# Patient Record
Sex: Male | Born: 1965 | Race: Black or African American | Hispanic: No | Marital: Single | Smoking: Never smoker
Health system: Southern US, Community
[De-identification: ages and names within clinical notes are randomized; demographics above are authoritative.]

## PROBLEM LIST (undated history)

## (undated) DIAGNOSIS — K7030 Alcoholic cirrhosis of liver without ascites: Secondary | ICD-10-CM

## (undated) DIAGNOSIS — K7682 Hepatic encephalopathy: Secondary | ICD-10-CM

## (undated) DIAGNOSIS — Z01818 Encounter for other preprocedural examination: Secondary | ICD-10-CM

## (undated) DIAGNOSIS — R7989 Other specified abnormal findings of blood chemistry: Secondary | ICD-10-CM

## (undated) DIAGNOSIS — I10 Essential (primary) hypertension: Secondary | ICD-10-CM

## (undated) DIAGNOSIS — K279 Peptic ulcer, site unspecified, unspecified as acute or chronic, without hemorrhage or perforation: Secondary | ICD-10-CM

## (undated) DIAGNOSIS — R778 Other specified abnormalities of plasma proteins: Secondary | ICD-10-CM

## (undated) DIAGNOSIS — I8511 Secondary esophageal varices with bleeding: Secondary | ICD-10-CM

## (undated) DIAGNOSIS — K922 Gastrointestinal hemorrhage, unspecified: Secondary | ICD-10-CM

## (undated) HISTORY — DX: Encounter for other preprocedural examination: Z01.818

## (undated) HISTORY — DX: Hepatic encephalopathy: K76.82

## (undated) HISTORY — DX: Other specified abnormal findings of blood chemistry: R79.89

## (undated) HISTORY — DX: Secondary esophageal varices with bleeding: I85.11

## (undated) HISTORY — DX: Other specified abnormalities of plasma proteins: R77.8

---

## 2011-08-19 ENCOUNTER — Emergency Department: Payer: Self-pay | Admitting: Emergency Medicine

## 2016-11-09 ENCOUNTER — Emergency Department: Payer: Self-pay

## 2016-11-09 ENCOUNTER — Emergency Department
Admission: EM | Admit: 2016-11-09 | Discharge: 2016-11-09 | Disposition: A | Discharge status: Home / Self Care | Payer: Self-pay | Attending: Emergency Medicine | Admitting: Emergency Medicine

## 2016-11-09 DIAGNOSIS — M7989 Other specified soft tissue disorders: Secondary | ICD-10-CM | POA: Insufficient documentation

## 2016-11-09 DIAGNOSIS — R55 Syncope and collapse: Secondary | ICD-10-CM | POA: Insufficient documentation

## 2016-11-09 DIAGNOSIS — R609 Edema, unspecified: Secondary | ICD-10-CM

## 2016-11-09 LAB — URINALYSIS, COMPLETE (UACMP) WITH MICROSCOPIC
Bacteria, UA: NONE SEEN
Bilirubin Urine: NEGATIVE
GLUCOSE, UA: NEGATIVE mg/dL
Hgb urine dipstick: NEGATIVE
Ketones, ur: NEGATIVE mg/dL
Leukocytes, UA: NEGATIVE
Nitrite: NEGATIVE
PH: 6 (ref 5.0–8.0)
Protein, ur: NEGATIVE mg/dL
SPECIFIC GRAVITY, URINE: 1.018 (ref 1.005–1.030)

## 2016-11-09 LAB — CBC WITH DIFFERENTIAL/PLATELET
BASOS PCT: 1 %
Basophils Absolute: 0.1 10*3/uL (ref 0–0.1)
EOS ABS: 0.4 10*3/uL (ref 0–0.7)
EOS PCT: 5 %
HCT: 38.8 % — ABNORMAL LOW (ref 40.0–52.0)
Hemoglobin: 13.2 g/dL (ref 13.0–18.0)
Lymphocytes Relative: 44 %
Lymphs Abs: 3.5 10*3/uL (ref 1.0–3.6)
MCH: 29.1 pg (ref 26.0–34.0)
MCHC: 33.9 g/dL (ref 32.0–36.0)
MCV: 85.8 fL (ref 80.0–100.0)
MONOS PCT: 11 %
Monocytes Absolute: 0.9 10*3/uL (ref 0.2–1.0)
Neutro Abs: 3 10*3/uL (ref 1.4–6.5)
Neutrophils Relative %: 39 %
PLATELETS: 144 10*3/uL — AB (ref 150–440)
RBC: 4.52 MIL/uL (ref 4.40–5.90)
RDW: 16.7 % — ABNORMAL HIGH (ref 11.5–14.5)
WBC: 7.8 10*3/uL (ref 3.8–10.6)

## 2016-11-09 LAB — COMPREHENSIVE METABOLIC PANEL
ALK PHOS: 152 U/L — AB (ref 38–126)
ALT: 48 U/L (ref 17–63)
AST: 97 U/L — ABNORMAL HIGH (ref 15–41)
Albumin: 3.3 g/dL — ABNORMAL LOW (ref 3.5–5.0)
Anion gap: 7 (ref 5–15)
BILIRUBIN TOTAL: 1.1 mg/dL (ref 0.3–1.2)
BUN: 8 mg/dL (ref 6–20)
CALCIUM: 8.6 mg/dL — AB (ref 8.9–10.3)
CO2: 25 mmol/L (ref 22–32)
CREATININE: 0.7 mg/dL (ref 0.61–1.24)
Chloride: 106 mmol/L (ref 101–111)
GFR calc non Af Amer: >60 mL/min (ref >60)
GLUCOSE: 162 mg/dL — AB (ref 65–99)
Potassium: 3.7 mmol/L (ref 3.5–5.1)
SODIUM: 138 mmol/L (ref 135–145)
TOTAL PROTEIN: 8.4 g/dL — AB (ref 6.5–8.1)

## 2016-11-09 LAB — URINE DRUG SCREEN, QUALITATIVE (ARMC ONLY)
AMPHETAMINES, UR SCREEN: NOT DETECTED
Barbiturates, Ur Screen: NOT DETECTED
Benzodiazepine, Ur Scrn: NOT DETECTED
COCAINE METABOLITE, UR ~~LOC~~: NOT DETECTED
Cannabinoid 50 Ng, Ur ~~LOC~~: NOT DETECTED
MDMA (ECSTASY) UR SCREEN: NOT DETECTED
Methadone Scn, Ur: NOT DETECTED
OPIATE, UR SCREEN: NOT DETECTED
PHENCYCLIDINE (PCP) UR S: NOT DETECTED
Tricyclic, Ur Screen: NOT DETECTED

## 2016-11-09 LAB — TROPONIN I: Troponin I: <0.03 ng/mL (ref <0.03)

## 2016-11-09 NOTE — ED Triage Notes (Signed)
Pt from home via EMS, pt initially reports chest pain and R arm pain. Per ems pt had syncopal episode and when he awoke he did not remember anything, was acting altered per family. Pt back to baseline now.

## 2016-11-09 NOTE — ED Provider Notes (Signed)
[facility] Emergency Department Provider Note   ____________________________________________   First MD Initiated Contact with Patient 11/09/16 1810     (approximate)  I have reviewed the triage vital signs and the nursing notes.   HISTORY  Chief Complaint Altered Mental Status and Chest Pain   HPI Yuto Dirienzo is a 50 y.o. male patient reports he developed a some blisters on his right hand sometime ago and pain shooting up from there to his shoulder. He was seen at urgent care and apparently was given a sling. Pain got worse today and he was going to go back to the urgent care reached out to the door of his girlfriend's car and the pain got for a bad and he fell down to the ground next to the car. He does not think he actually passed out. EMS arrived because he was still on the ground he complained of the pain shooting up into his shoulder and somewhat into the chest as well. He did not want to go to the hospital in the ambulance she was given a signed the release for the EMS crew and then rolled over and passed out. EMS gave him a sternal rub in the back of the ambulance and he woke up but was not acting normally. He signed a release he did not sign on the line but well below it and very scrawled signature. Then he woke up more started acting normally. He was acting abnormally he said he couldn't move his legs. After he woke up he did not remember any of that. Not have any seizure activity. He is now acting normally and told me about the episode or reaching out to the car door but could not remember the rest of it. Some pain in his hand radiating up to the shoulder at the present time.   History reviewed. No pertinent past medical history. No past medical history available patient has not been to the doctor for many years There are no active problems to display for this patient.   History reviewed. No pertinent surgical history.  Prior to Admission medications    Not on File    Allergies Patient has no known allergies.  No family history on file.  Social History Social History  Substance Use Topics  . Smoking status: Never Smoker  . Smokeless tobacco: Never Used  . Alcohol use No    Review of Systems Constitutional: No fever/chills Eyes: No visual changes. ENT: No sore throat. Cardiovascular:See history of present illness Respiratory: Denies shortness of breath at present. Gastrointestinal: No abdominal pain.  No nausea, no vomiting.  No diarrhea.  No constipation. Genitourinary: Negative for dysuria. Musculoskeletal: Negative for back pain. Skin: Negative for rash. Neurological: Negative for headaches, focal weakness or numbness.  10-point ROS otherwise negative.  ____________________________________________   PHYSICAL EXAM:  VITAL SIGNS: ED Triage Vitals  Enc Vitals Group     BP 11/09/16 1801 (!) 134/105     Pulse Rate 11/09/16 1802 90     Resp 11/09/16 1811 18     Temp 11/09/16 1812 98.1 F (36.7 C)     Temp src --      SpO2 11/09/16 1802 95 %     Weight 11/09/16 1803 240 lb (108.9 kg)     Height 11/09/16 1803 5\' 8"  (1.727 m)     Head Circumference --      Peak Flow --      Pain Score 11/09/16 1803 8  Pain Loc --      Pain Edu? --      Excl. in [place]? --     Constitutional: Alert and oriented. Well appearing and in no acute distress. Eyes: Conjunctivae are normal. PERRL. EOMI. Head: Atraumatic. Nose: No congestion/rhinnorhea. Mouth/Throat: Mucous membranes are moist.  Oropharynx non-erythematous. Neck: No stridor.  Cardiovascular: Normal rate, regular rhythm. Grossly normal heart sounds.  Good peripheral circulation. Respiratory: Normal respiratory effort.  No retractions. Lungs CTAB. Gastrointestinal: Soft and nontender. No distention. No abdominal bruits. No CVA tenderness. Musculoskeletal: No lower extremity tenderness nor edema.  No joint effusions. Neurologic:  Normal speech and language. No gross  focal neurologic deficits are appreciated Nerves II through XII are intact motor is 5 over 5 throughout sensation appears to be normal.  Skin:  Skin is warm, dry and intact. No rash noted except for what appear to be 3 healing blisters on his right hand on the palm.Marland Kitchen Psychiatric: Mood and affect are normal. Speech and behavior are normal.  ____________________________________________   LABS (all labs ordered are listed, but only abnormal results are displayed)  Labs Reviewed  COMPREHENSIVE METABOLIC PANEL - Abnormal; Notable for the following:       Result Value   Glucose, Bld 162 (*)    Calcium 8.6 (*)    Total Protein 8.4 (*)    Albumin 3.3 (*)    AST 97 (*)    Alkaline Phosphatase 152 (*)    All other components within normal limits  CBC WITH DIFFERENTIAL/PLATELET - Abnormal; Notable for the following:    HCT 38.8 (*)    RDW 16.7 (*)    Platelets 144 (*)    All other components within normal limits  TROPONIN I  URINALYSIS, COMPLETE (UACMP) WITH MICROSCOPIC  URINE DRUG SCREEN, QUALITATIVE ([facility] ONLY)  TROPONIN I   ____________________________________________  EKG  EKG read and interpreted by me shows normal sinus rhythm rate of 94 irregular baseline nonspecific ST-T wave changes ____________________________________________  RADIOLOGY  Study Result   CLINICAL DATA:  Chest pain today radiating to RIGHT arm. Syncopal episode.  EXAM: PORTABLE CHEST 1 VIEW  COMPARISON:  None.  FINDINGS: Cardiomediastinal silhouette is normal. No pleural effusions or focal consolidations. Trachea projects midline and there is no pneumothorax. Soft tissue planes and included osseous structures are non-suspicious. Moderate RIGHT acromioclavicular osteoarthrosis.  IMPRESSION: No acute cardiopulmonary process.   Electronically Signed   By: Elon Alas M.D.   On: 11/09/2016 19:12    Study Result   CLINICAL DATA:  Syncopal episode today. Altered mental  status. Chronic pain.  EXAM: CT HEAD WITHOUT CONTRAST  TECHNIQUE: Contiguous axial images were obtained from the base of the skull through the vertex without intravenous contrast.  COMPARISON:  None.  FINDINGS: Brain: No evidence of acute infarction, hemorrhage, hydrocephalus, extra-axial collection or mass lesion/mass effect.  Vascular: No hyperdense vessel or unexpected calcification.  Skull: Normal. Negative for fracture or focal lesion.  Sinuses/Orbits: No acute finding.  Other: None.  IMPRESSION: Negative noncontrast head CT.   Electronically Signed   By: Earle Gell M.D.   On: 11/09/2016 19:16     ____________________________________________   PROCEDURES  Procedure(s) performed:  Procedures  Critical Care performed: ____________________________________________   INITIAL IMPRESSION / ASSESSMENT AND PLAN / ED COURSE  Pertinent labs & imaging results that were available during my care of the patient were reviewed by me and considered in my medical decision making (see chart for details).    Clinical Course  Awaiting the second troponin. We'll sign the patient out to Dr. Dineen Kid  ____________________________________________   FINAL CLINICAL IMPRESSION(S) / ED DIAGNOSES  Final diagnoses:  Syncope and collapse      NEW MEDICATIONS STARTED DURING THIS VISIT:  New Prescriptions   No medications on file     Note:  This document was prepared using Dragon voice recognition software and may include unintentional dictation errors.    Nena Polio, MD 11/09/16 2016

## 2016-11-09 NOTE — ED Provider Notes (Signed)
Patient present in the emergency department with right arm pain radiating into his chest and syncopal episode.  So far his workup has been very reassuring. The signout from Dr. Cinda Quest is to check a second troponin and also follow up with the Doppler sign of his right upper extremity. If both reassuring the patient's plan is to be discharged home.  Physical Exam  BP (!) 145/96   Pulse 80   Temp 98.1 F (36.7 C)   Resp 15   Ht 5\' 8"  (1.727 m)   Wt 240 lb (108.9 kg)   SpO2 97%   BMI 36.49 kg/m   ----------------------------------------- 10:55 PM on 11/09/2016 -----------------------------------------  No evidence of any deep venous thrombosis to the right upper extremity.  Physical Exam Patient resting comfortably at this time without any distress. ED Course  Procedures  MDM Patient asking to be discharged home. Reassuring workup. Will be discharged. Counseled patient as to his ultrasound as well as second troponin which were both negative. He will follow-up as an outpatient.       Orbie Pyo, MD 11/09/16 [phone]

## 2016-11-09 NOTE — ED Notes (Signed)
Patient transported to Ultrasound 

## 2016-11-09 NOTE — Discharge Instructions (Signed)
Please follow-up with [facility] or any other doctor in the area. You can also see the [facility], the [facility] clinic, [facility], or [place]. Please return for any further problems. I am not sure what happened this evening but all the tests so far have been normal.

## 2016-11-09 NOTE — ED Notes (Signed)
Reviewed d/c instructions, follow-up care with patient. Pt verbalized understanding.  

## 2018-11-02 ENCOUNTER — Emergency Department: Payer: Self-pay

## 2018-11-02 ENCOUNTER — Emergency Department
Admission: EM | Admit: 2018-11-02 | Discharge: 2018-11-02 | Disposition: A | Discharge status: Home / Self Care | Payer: Self-pay | Attending: Emergency Medicine | Admitting: Emergency Medicine

## 2018-11-02 ENCOUNTER — Encounter: Payer: Self-pay | Admitting: Emergency Medicine

## 2018-11-02 DIAGNOSIS — Y9289 Other specified places as the place of occurrence of the external cause: Secondary | ICD-10-CM | POA: Insufficient documentation

## 2018-11-02 DIAGNOSIS — Y990 Civilian activity done for income or pay: Secondary | ICD-10-CM | POA: Insufficient documentation

## 2018-11-02 DIAGNOSIS — M629 Disorder of muscle, unspecified: Secondary | ICD-10-CM

## 2018-11-02 DIAGNOSIS — M62461 Contracture of muscle, right lower leg: Secondary | ICD-10-CM | POA: Insufficient documentation

## 2018-11-02 DIAGNOSIS — Y9389 Activity, other specified: Secondary | ICD-10-CM | POA: Insufficient documentation

## 2018-11-02 DIAGNOSIS — X509XXA Other and unspecified overexertion or strenuous movements or postures, initial encounter: Secondary | ICD-10-CM | POA: Insufficient documentation

## 2018-11-02 DIAGNOSIS — M62462 Contracture of muscle, left lower leg: Secondary | ICD-10-CM | POA: Insufficient documentation

## 2018-11-02 DIAGNOSIS — T148XXA Other injury of unspecified body region, initial encounter: Secondary | ICD-10-CM | POA: Insufficient documentation

## 2018-11-02 MED ORDER — MELOXICAM 15 MG PO TABS: 15.0000 mg | ORAL_TABLET | Freq: Every day | ORAL | 0 refills | Status: AC

## 2018-11-02 MED ORDER — CYCLOBENZAPRINE HCL 5 MG PO TABS: 5.0000 mg | ORAL_TABLET | Freq: Three times a day (TID) | ORAL | 0 refills | Status: AC | PRN

## 2018-11-02 NOTE — Discharge Instructions (Addendum)
Your exam and x-rays are negative for any acute spinal injury. You appear to have muscle stiffness and strain. Take the prescription meds as directed. Follow-up with one of the local community clinics as needed.

## 2018-11-02 NOTE — ED Provider Notes (Signed)
[facility] Emergency Department Provider Note ____________________________________________  Time seen: 2157  I have reviewed the triage vital signs and the nursing notes.  HISTORY  Chief Complaint  Back Pain  HPI Terrence Martin is a 52 y.o. male who presents himself to the ED for evaluation of pain to bilateral buttocks and legs.  Describes symptoms of been persistent over the last 2 weeks.  He reports no current, chronic or ongoing low back pain.  He has had intermittent strains to the lower back over the years, but denies any chronic or persistent symptoms.  He is describing tightness to the buttocks, hamstrings, and calves to the posterior legs bilaterally.  He denies any bladder or bowel incontinence, distal paresthesias, footdrop, or saddle anesthesia.  He has been taking Tylenol over the last few days with limited benefit.  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  He denies any work-related injury, accident, trauma, or fall.  Patient denies any chest pain, shortness of breath, or recent illness.  History reviewed. No pertinent past medical history.  There are no active problems to display for this patient.  History reviewed. No pertinent surgical history.  Prior to Admission medications   Not on File    Allergies Patient has no known allergies.  No family history on file.  Social History Social History   Tobacco Use  . Smoking status: Never Smoker  . Smokeless tobacco: Never Used  Substance Use Topics  . Alcohol use: Yes  . Drug use: Never    Review of Systems  Constitutional: Negative for fever. Cardiovascular: Negative for chest pain. Respiratory: Negative for shortness of breath. Gastrointestinal: Negative for abdominal pain, vomiting and diarrhea. Genitourinary: Negative for dysuria. Musculoskeletal: Negative for back pain.  Bilateral lower  extremity pain along the posterior chain as above. Skin: Negative for rash. Neurological: Negative for headaches, focal weakness or numbness. ____________________________________________  PHYSICAL EXAM:  VITAL SIGNS: ED Triage Vitals [11/02/18 2053]  Enc Vitals Group     BP (!) 155/98     Pulse Rate 86     Resp 18     Temp 97.8 F (36.6 C)     Temp Source Oral     SpO2 98 %     Weight 224 lb (101.6 kg)     Height 5\' 8"  (1.727 m)     Head Circumference      Peak Flow      Pain Score 10     Pain Loc      Pain Edu?      Excl. in [place]?     Constitutional: Alert and oriented. Well appearing and in no distress. Head: Normocephalic and atraumatic. Eyes: Conjunctivae are normal. Normal extraocular movements Neck: Supple. Normal ROM Cardiovascular: Normal rate, regular rhythm. Normal distal pulses. Respiratory: Normal respiratory effort. No wheezes/rales/rhonchi. Gastrointestinal: Soft and nontender. No distention. Musculoskeletal: Normal spinal alignment without midline tenderness, spasm, deformity, or step-off.  Patient transitions from sit to stand without assistance.  He is without tenderness along the lumbar sacral spine.  He is able to demonstrate normal lumbar flexion and extension range on exam without difficulty.  He does report subjective muscle tightness along the gluteal and hamstring musculature.  Normal hip flexion noted bilaterally.  Nontender with normal range of motion in all extremities.  Neurologic: Cranial nerves II through XII grossly intact.  Normal LE DTRs bilaterally.  Normal  toe dorsiflexion foot eversion.  Negative seated straight leg raise bilaterally.  Normal toe and heel walk on exam.  Normal gait without ataxia. Normal speech and language. No gross focal neurologic deficits are appreciated. Skin:  Skin is warm, dry and intact. No rash noted. ___________________________________________   RADIOLOGY  Lumbar Spine Complete  IMPRESSION: 1. No acute fracture  or subluxation of the lumbar spine. 2. Grade 1 L4-L5 anterolisthesis. 3. Mild age indeterminate compression deformities of T11 and T12. ____________________________________________  PROCEDURES  Procedures ____________________________________________  INITIAL IMPRESSION / ASSESSMENT AND PLAN / ED COURSE  Patient with ED evaluation of bilateral buttocks and hamstring muscle pain.  He is without any acute back pain at this time.  Patient's clinical picture is reassuring as it shows no acute neuromuscular deficit.  Symptoms likely represent bilateral posterior chain muscle soreness.  Patient will be discharged with a prescription for cyclobenzaprine as well as meloxicam to take as directed.  He is also referred to local community clinic for routine medical care.  He is advised to consider stretching and warming up prior to his physical work activities.   ____________________________________________  FINAL CLINICAL IMPRESSION(S) / ED DIAGNOSES  Final diagnoses:  Muscle strain  Hamstring tightness of both lower extremities      Sinclair Alligood, Dannielle Karvonen, PA-C 11/02/18 2338    Arta Silence, MD 11/03/18 0002

## 2018-11-02 NOTE — ED Triage Notes (Signed)
Patient with complaint of bilateral lower back pain radiating down bilateral legs times two weeks. Patient states that the pain is getting worse.

## 2020-06-08 DIAGNOSIS — K922 Gastrointestinal hemorrhage, unspecified: Secondary | ICD-10-CM | POA: Insufficient documentation

## 2020-08-19 ENCOUNTER — Other Ambulatory Visit: Payer: Self-pay

## 2020-08-19 ENCOUNTER — Emergency Department
Admission: EM | Admit: 2020-08-19 | Discharge: 2020-08-19 | Disposition: A | Discharge status: Home / Self Care | Payer: Self-pay | Attending: Emergency Medicine | Admitting: Emergency Medicine

## 2020-08-19 ENCOUNTER — Telehealth: Payer: Self-pay | Admitting: Oncology

## 2020-08-19 DIAGNOSIS — W19XXXA Unspecified fall, initial encounter: Secondary | ICD-10-CM | POA: Insufficient documentation

## 2020-08-19 DIAGNOSIS — Z20822 Contact with and (suspected) exposure to covid-19: Secondary | ICD-10-CM | POA: Insufficient documentation

## 2020-08-19 DIAGNOSIS — D649 Anemia, unspecified: Secondary | ICD-10-CM | POA: Insufficient documentation

## 2020-08-19 DIAGNOSIS — R42 Dizziness and giddiness: Secondary | ICD-10-CM

## 2020-08-19 DIAGNOSIS — R55 Syncope and collapse: Secondary | ICD-10-CM

## 2020-08-19 LAB — IRON AND TIBC
Iron: 13 ug/dL — ABNORMAL LOW (ref 45–182)
Saturation Ratios: 3 % — ABNORMAL LOW (ref 17.9–39.5)
TIBC: 496 ug/dL — ABNORMAL HIGH (ref 250–450)
UIBC: 483 ug/dL

## 2020-08-19 LAB — BRAIN NATRIURETIC PEPTIDE: B Natriuretic Peptide: 83 pg/mL (ref 0.0–100.0)

## 2020-08-19 LAB — BASIC METABOLIC PANEL
Anion gap: 8 (ref 5–15)
BUN: 6 mg/dL (ref 6–20)
CO2: 23 mmol/L (ref 22–32)
Calcium: 8.1 mg/dL — ABNORMAL LOW (ref 8.9–10.3)
Chloride: 106 mmol/L (ref 98–111)
Creatinine, Ser: 0.72 mg/dL (ref 0.61–1.24)
GFR calc Af Amer: >60 mL/min (ref >60)
GFR calc non Af Amer: >60 mL/min (ref >60)
Glucose, Bld: 108 mg/dL — ABNORMAL HIGH (ref 70–99)
Potassium: 3.4 mmol/L — ABNORMAL LOW (ref 3.5–5.1)
Sodium: 137 mmol/L (ref 135–145)

## 2020-08-19 LAB — CBC WITH DIFFERENTIAL/PLATELET
Abs Immature Granulocytes: 0.01 10*3/uL (ref 0.00–0.07)
Basophils Absolute: 0.1 10*3/uL (ref 0.0–0.1)
Basophils Relative: 1 %
Eosinophils Absolute: 0.3 10*3/uL (ref 0.0–0.5)
Eosinophils Relative: 6 %
HCT: 24.9 % — ABNORMAL LOW (ref 39.0–52.0)
Hemoglobin: 7.4 g/dL — ABNORMAL LOW (ref 13.0–17.0)
Immature Granulocytes: 0 %
Lymphocytes Relative: 39 %
Lymphs Abs: 1.7 10*3/uL (ref 0.7–4.0)
MCH: 21.1 pg — ABNORMAL LOW (ref 26.0–34.0)
MCHC: 29.7 g/dL — ABNORMAL LOW (ref 30.0–36.0)
MCV: 71.1 fL — ABNORMAL LOW (ref 80.0–100.0)
Monocytes Absolute: 0.5 10*3/uL (ref 0.1–1.0)
Monocytes Relative: 11 %
Neutro Abs: 1.9 10*3/uL (ref 1.7–7.7)
Neutrophils Relative %: 43 %
Platelets: 109 10*3/uL — ABNORMAL LOW (ref 150–400)
RBC: 3.5 MIL/uL — ABNORMAL LOW (ref 4.22–5.81)
RDW: 20.4 % — ABNORMAL HIGH (ref 11.5–15.5)
WBC: 4.4 10*3/uL (ref 4.0–10.5)
nRBC: 0 % (ref 0.0–0.2)

## 2020-08-19 LAB — CBC
HCT: 23 % — ABNORMAL LOW (ref 39.0–52.0)
Hemoglobin: 7.3 g/dL — ABNORMAL LOW (ref 13.0–17.0)
MCH: 21.6 pg — ABNORMAL LOW (ref 26.0–34.0)
MCHC: 31.7 g/dL (ref 30.0–36.0)
MCV: 68 fL — ABNORMAL LOW (ref 80.0–100.0)
Platelets: 101 10*3/uL — ABNORMAL LOW (ref 150–400)
RBC: 3.38 MIL/uL — ABNORMAL LOW (ref 4.22–5.81)
RDW: 20.9 % — ABNORMAL HIGH (ref 11.5–15.5)
WBC: 5.4 10*3/uL (ref 4.0–10.5)
nRBC: 0 % (ref 0.0–0.2)

## 2020-08-19 LAB — PREPARE RBC (CROSSMATCH)

## 2020-08-19 LAB — TROPONIN I (HIGH SENSITIVITY): Troponin I (High Sensitivity): 11 ng/L (ref <18)

## 2020-08-19 LAB — SARS CORONAVIRUS 2 BY RT PCR (HOSPITAL ORDER, PERFORMED IN ~~LOC~~ HOSPITAL LAB): SARS Coronavirus 2: NEGATIVE

## 2020-08-19 MED ORDER — FERROUS SULFATE 325 (65 FE) MG PO TABS: 325.0000 mg | ORAL_TABLET | Freq: Two times a day (BID) | ORAL | 3 refills | Status: DC

## 2020-08-19 MED ORDER — SODIUM CHLORIDE 0.9 % IV SOLN
10.0000 mL/h | Freq: Once | INTRAVENOUS | Status: AC
  Administered 2020-08-19: 10 mL/h via INTRAVENOUS

## 2020-08-19 NOTE — ED Notes (Signed)
Additional lavender tube sent to lab for ABO RH.

## 2020-08-19 NOTE — ED Provider Notes (Signed)
[facility] Emergency Department Provider Note  Time seen: 5:33 PM  I have reviewed the triage vital signs and the nursing notes.   HISTORY  Chief Complaint Dizziness and Fall   HPI Terrence Martin is a 54 y.o. male with a past medical history of an upper GI bleed 3 months ago presents to the emergency department for weakness and a fall.   According to the patient in July he was admitted to the hospital while visiting [facility] for vomiting blood.  Patient states he required blood transfusions at that time.  However since going home he has had no further issues.  No nausea no vomiting no black or bloody stool.  Patient does admit to daily aspirin and alcohol use, states he stopped drinking liquor after that admission but still drinks beer.  Patient states he averages about 2 aspirin per day for various aches and pains.  Patient states he has noticed some leg swelling since his discharge from the hospital months ago.  Denies any chest pain or shortness of breath.  Has not received Covid vaccinations but denies any recent fever cough or congestion.  No past medical history on file.  There are no problems to display for this patient.   No past surgical history on file.  Prior to Admission medications   Not on File    No Known Allergies  No family history on file.  Social History Social History   Tobacco Use  . Smoking status: Never Smoker  . Smokeless tobacco: Never Used  Substance Use Topics  . Alcohol use: Yes  . Drug use: Never    Review of Systems Constitutional: Negative for fever. Cardiovascular: Negative for chest pain. Respiratory: Negative for shortness of breath. Gastrointestinal: Negative for abdominal pain Musculoskeletal: Negative for musculoskeletal complaints Neurological: Negative for headache All other ROS negative  ____________________________________________   PHYSICAL EXAM:  VITAL SIGNS: ED Triage Vitals  Enc Vitals Group      BP 08/19/20 1431 133/73     Pulse Rate 08/19/20 1431 88     Resp 08/19/20 1431 20     Temp 08/19/20 1431 98.1 F (36.7 C)     Temp Source 08/19/20 1431 Oral     SpO2 08/19/20 1431 100 %     Weight 08/19/20 1432 240 lb (108.9 kg)     Height 08/19/20 1432 5\' 8"  (1.727 m)     Head Circumference --      Peak Flow --      Pain Score 08/19/20 1431 0     Pain Loc --      Pain Edu? --      Excl. in [place]? --     Constitutional: Alert and oriented. Well appearing and in no distress. Eyes: Normal exam ENT      Head: Normocephalic and atraumatic.      Mouth/Throat: Mucous membranes are moist. Cardiovascular: Normal rate, regular rhythm. No murmur Respiratory: Normal respiratory effort without tachypnea nor retractions. Breath sounds are clear  Gastrointestinal: Soft and nontender. No distention.   Musculoskeletal: Nontender with normal range of motion in all extremities.  Positive lower extreme edema bilaterally.  Taut legs.  Nontender. Neurologic:  Normal speech and language. No gross focal neurologic deficits  Skin:  Skin is warm, dry and intact.  Psychiatric: Mood and affect are normal.   ____________________________________________   INITIAL IMPRESSION / ASSESSMENT AND PLAN / ED COURSE  Pertinent labs & imaging results that were available during my care of the patient  were reviewed by me and considered in my medical decision making (see chart for details).   Patient presents to the emergency department for generalized weakness while at work today and had a near fall.  Denies hitting his head denies LOC.  Patient states he had been feeling well up until today.  States he feels well currently.  States his boss wanted him to go get checked out.  Patient's hemoglobin found to be very low at 7.3 as well as a low MCV of 68 and a low platelet count of 101.  Patient denies any nausea vomiting, rectal examination shows light brown stool guaiac negative.  Last labs we have on the patient were from 3  years ago showing a normal hemoglobin and MCV at that time.  Now his MCV being 68 would indicate likely iron deficient anemia.  Patient states he checks his stool every bowel movement has not noticed any black stool at any point.  I spoke to hematology they recommend adding on a differential we will repeat a CBC with differential as well as iron and TIBC.  I have added on a troponin as well as a BNP.  If lab work returns largely normal I would anticipate likely discharge home and hematology states they will call the patient follow-up for further work-up and treatment and possible iron infusions.  I discussed this with the patient as well he is agreeable.  We will transfuse 1 unit of blood in the emergency department given likely symptomatic anemia.  However as there is no ongoing bleed I believe the patient would be safe for discharge home with hematology follow-up.  Patient has finished his blood products.  Patient feels well.  We will discharge from the emergency department on oral iron supplements.  Patient will follow up with hematology, they state they will call him tomorrow to set up an appointment.  Patient agreeable to plan of care.  Terrence Martin was evaluated in Emergency Department on 08/19/2020 for the symptoms described in the history of present illness. He was evaluated in the context of the global COVID-19 pandemic, which necessitated consideration that the patient might be at risk for infection with the SARS-CoV-2 virus that causes COVID-19. Institutional protocols and algorithms that pertain to the evaluation of patients at risk for COVID-19 are in a state of rapid change based on information released by regulatory bodies including the CDC and federal and state organizations. These policies and algorithms were followed during the patient's care in the ED.  ____________________________________________   FINAL CLINICAL IMPRESSION(S) / ED DIAGNOSES  Symptomatic anemia Weakness   Harvest Dark, MD 08/19/20 2248

## 2020-08-19 NOTE — ED Triage Notes (Signed)
Pt here with a fall and dizziness. Pt was hospitalized in [place] with a GI bleed in July. Pt went to work today and was dizzy and fell but denies hitting his head or any injury. Pt NAD in triage.

## 2020-08-19 NOTE — Telephone Encounter (Signed)
I was called by ER Dr.Paduchowski regarding this patient, acute drop of hemoglobin to 7.3, MCV 68.  Baseline hemoglobin in 2017 was 13.2.  Patient reports history of upper GI bleeding in [place] in July this year.  Chronic alcohol use.  Per  Dr.Paduchowski,  Negative stool occult, vitals are stable.  Patient will get 1 unit of PRBC in the emergency room and Dr.Paduchowski plans to discharge patient afterwards and advise patient to follow-up with hematology.  I recommend baseline iron, TIBC, ferritin and a differential being checked prior to the blood transfusion.  I will send message to scheduling staff to schedule this patient to follow-up with me in the next few days for reevaluation.

## 2020-08-20 ENCOUNTER — Other Ambulatory Visit: Payer: Self-pay

## 2020-08-20 DIAGNOSIS — D649 Anemia, unspecified: Secondary | ICD-10-CM

## 2020-08-20 LAB — BPAM RBC
Blood Product Expiration Date: 202110252359
ISSUE DATE / TIME: 202109222021
Unit Type and Rh: 5100

## 2020-08-20 LAB — TYPE AND SCREEN
ABO/RH(D): O POS
Antibody Screen: NEGATIVE
Unit division: 0

## 2020-08-20 LAB — ABO/RH
ABO/RH(D): O POS
ABO/RH(D): O POS

## 2020-08-24 ENCOUNTER — Inpatient Hospital Stay: Payer: Self-pay

## 2020-08-24 ENCOUNTER — Other Ambulatory Visit: Payer: Self-pay

## 2020-08-24 ENCOUNTER — Encounter: Payer: Self-pay | Admitting: Oncology

## 2020-08-24 ENCOUNTER — Inpatient Hospital Stay: Payer: Self-pay | Attending: Oncology | Admitting: Oncology

## 2020-08-24 VITALS — BP 167/97 | HR 88 | Temp 98.9°F | Resp 18 | Ht 68.0 in | Wt 234.3 lb

## 2020-08-24 DIAGNOSIS — Z7289 Other problems related to lifestyle: Secondary | ICD-10-CM | POA: Insufficient documentation

## 2020-08-24 DIAGNOSIS — R7401 Elevation of levels of liver transaminase levels: Secondary | ICD-10-CM | POA: Insufficient documentation

## 2020-08-24 DIAGNOSIS — E538 Deficiency of other specified B group vitamins: Secondary | ICD-10-CM | POA: Insufficient documentation

## 2020-08-24 DIAGNOSIS — Z801 Family history of malignant neoplasm of trachea, bronchus and lung: Secondary | ICD-10-CM | POA: Insufficient documentation

## 2020-08-24 DIAGNOSIS — D509 Iron deficiency anemia, unspecified: Secondary | ICD-10-CM | POA: Insufficient documentation

## 2020-08-24 DIAGNOSIS — D649 Anemia, unspecified: Secondary | ICD-10-CM

## 2020-08-24 DIAGNOSIS — Z789 Other specified health status: Secondary | ICD-10-CM | POA: Insufficient documentation

## 2020-08-24 DIAGNOSIS — D696 Thrombocytopenia, unspecified: Secondary | ICD-10-CM | POA: Insufficient documentation

## 2020-08-24 HISTORY — DX: Iron deficiency anemia, unspecified: D50.9

## 2020-08-24 LAB — HEPATIC FUNCTION PANEL
ALT: 29 U/L (ref 0–44)
AST: 62 U/L — ABNORMAL HIGH (ref 15–41)
Albumin: 2.9 g/dL — ABNORMAL LOW (ref 3.5–5.0)
Alkaline Phosphatase: 106 U/L (ref 38–126)
Bilirubin, Direct: 0.5 mg/dL — ABNORMAL HIGH (ref 0.0–0.2)
Indirect Bilirubin: 1 mg/dL — ABNORMAL HIGH (ref 0.3–0.9)
Total Bilirubin: 1.5 mg/dL — ABNORMAL HIGH (ref 0.3–1.2)
Total Protein: 7.9 g/dL (ref 6.5–8.1)

## 2020-08-24 LAB — CBC WITH DIFFERENTIAL/PLATELET
Abs Immature Granulocytes: 0.01 10*3/uL (ref 0.00–0.07)
Basophils Absolute: 0.1 10*3/uL (ref 0.0–0.1)
Basophils Relative: 1 %
Eosinophils Absolute: 0.3 10*3/uL (ref 0.0–0.5)
Eosinophils Relative: 5 %
HCT: 26.1 % — ABNORMAL LOW (ref 39.0–52.0)
Hemoglobin: 8.4 g/dL — ABNORMAL LOW (ref 13.0–17.0)
Immature Granulocytes: 0 %
Lymphocytes Relative: 37 %
Lymphs Abs: 2 10*3/uL (ref 0.7–4.0)
MCH: 22.3 pg — ABNORMAL LOW (ref 26.0–34.0)
MCHC: 32.2 g/dL (ref 30.0–36.0)
MCV: 69.2 fL — ABNORMAL LOW (ref 80.0–100.0)
Monocytes Absolute: 0.6 10*3/uL (ref 0.1–1.0)
Monocytes Relative: 10 %
Neutro Abs: 2.5 10*3/uL (ref 1.7–7.7)
Neutrophils Relative %: 47 %
Platelets: 115 10*3/uL — ABNORMAL LOW (ref 150–400)
RBC: 3.77 MIL/uL — ABNORMAL LOW (ref 4.22–5.81)
RDW: 22.3 % — ABNORMAL HIGH (ref 11.5–15.5)
WBC: 5.4 10*3/uL (ref 4.0–10.5)
nRBC: 0 % (ref 0.0–0.2)

## 2020-08-24 LAB — SAMPLE TO BLOOD BANK

## 2020-08-24 LAB — FOLATE: Folate: 9.8 ng/mL (ref >5.9)

## 2020-08-24 LAB — VITAMIN B12: Vitamin B-12: 236 pg/mL (ref 180–914)

## 2020-08-24 MED ORDER — VITAMIN B-12 1000 MCG PO TABS: 1000.0000 ug | ORAL_TABLET | Freq: Every day | ORAL | 2 refills | Status: DC

## 2020-08-24 NOTE — Progress Notes (Signed)
Hematology/Oncology Consult note [facility] Telephone:([phone] [phone]   Patient Care Team: Patient, No Pcp Per as PCP - General (General Practice)  REFERRING PROVIDER: Harvest Dark, MD  CHIEF COMPLAINTS/REASON FOR VISIT:  Evaluation of iron deficiency anemia  HISTORY OF PRESENTING ILLNESS:   Terrence Martin is a  54 y.o.  male with PMH listed below was seen in consultation at the request of  Harvest Dark, MD  for evaluation of iron deficiency anemia Patient presented emergency room on 08/19/2020 for evaluation of dizziness and fall. He was found to have hemoglobin of 7.3, MCV 68, also thrombocytopenia with platelet count of 10 1000. Reviewed his records, 06/11/2020 he has CBC done which showed a hemoglobin of 8.2, MCV of 92, thrombocytopenia with platelet count of 1 10,000.  Since then, he reports he had an episode of upper GI bleeding at outside facility. Patient denies any hematuria, hemoptysis, black or bloody stool.  Denies any change of bowel movement. He has a long history of alcohol use.  He used to drink liquor every day.  Currently he drinks 3-4 beers every other day. Patient takes NSAIDs intermittently for pains and aches. He denies any easy bruising, abdominal pain, fever or chills.  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 He received 1 unit of PRBC in the emergency room and was advised to establish care with hematology for further treatment and management.   Review of Systems  Constitutional: Positive for fatigue. Negative for appetite change, chills, fever and unexpected weight change.  HENT:   Negative for hearing loss and voice change.   Eyes: Negative for eye problems and icterus.  Respiratory: Negative for chest tightness, cough and shortness of breath.   Cardiovascular: Negative for chest pain and leg swelling.  Gastrointestinal: Negative for  abdominal distention and abdominal pain.  Endocrine: Negative for hot flashes.  Genitourinary: Negative for difficulty urinating, dysuria and frequency.   Musculoskeletal: Negative for arthralgias.  Skin: Negative for itching and rash.  Neurological: Negative for light-headedness and numbness.  Hematological: Negative for adenopathy. Does not bruise/bleed easily.  Psychiatric/Behavioral: Negative for confusion.    MEDICAL HISTORY:  Past Medical History:  Diagnosis Date  . IDA (iron deficiency anemia) 08/24/2020    SURGICAL HISTORY: History reviewed. No pertinent surgical history.  SOCIAL HISTORY: Social History   Socioeconomic History  . Marital status: Single    Spouse name: Not on file  . Number of children: Not on file  . Years of education: Not on file  . Highest education level: Not on file  Occupational History  . Not on file  Tobacco Use  . Smoking status: Never Smoker  . Smokeless tobacco: Never Used  Vaping Use  . Vaping Use: Never used  Substance and Sexual Activity  . Alcohol use: Not Currently  . Drug use: Never  . Sexual activity: Not on file  Other Topics Concern  . Not on file  Social History Narrative  . Not on file   Social Determinants of Health   Financial Resource Strain:   . Difficulty of Paying Living Expenses: Not on file  Food Insecurity:   . Worried About Charity fundraiser in the Last Year: Not on file  . Ran Out of Food in the Last Year: Not on file  Transportation Needs:   . Lack of Transportation (Medical): Not on file  . Lack of Transportation (Non-Medical): Not  on file  Physical Activity:   . Days of Exercise per Week: Not on file  . Minutes of Exercise per Session: Not on file  Stress:   . Feeling of Stress : Not on file  Social Connections:   . Frequency of Communication with Friends and Family: Not on file  . Frequency of Social Gatherings with Friends and Family: Not on file  . Attends Religious Services: Not on file  .  Active Member of Clubs or Organizations: Not on file  . Attends Archivist Meetings: Not on file  . Marital Status: Not on file  Intimate Partner Violence:   . Fear of Current or Ex-Partner: Not on file  . Emotionally Abused: Not on file  . Physically Abused: Not on file  . Sexually Abused: Not on file    FAMILY HISTORY: Family History  Problem Relation Age of Onset  . Lung cancer Mother   . Cancer Father   . Heart attack Brother     ALLERGIES:  has No Known Allergies.  MEDICATIONS:  Current Outpatient Medications  Medication Sig Dispense Refill  . ferrous sulfate 325 (65 FE) MG tablet Take 1 tablet (325 mg total) by mouth 2 (two) times daily with a meal. 60 tablet 3   No current facility-administered medications for this visit.     PHYSICAL EXAMINATION: ECOG PERFORMANCE STATUS: 0 - Asymptomatic Vitals:   08/24/20 1103  BP: (!) 167/97  Pulse: 88  Resp: 18  Temp: 98.9 F (37.2 C)   Filed Weights   08/24/20 1103  Weight: 234 lb 4.8 oz (106.3 kg)    Physical Exam Constitutional:      General: He is not in acute distress. HENT:     Head: Normocephalic and atraumatic.  Eyes:     General: No scleral icterus. Cardiovascular:     Rate and Rhythm: Normal rate and regular rhythm.     Heart sounds: Normal heart sounds.  Pulmonary:     Effort: Pulmonary effort is normal. No respiratory distress.     Breath sounds: No wheezing.  Abdominal:     General: Bowel sounds are normal. There is no distension.     Palpations: Abdomen is soft.  Musculoskeletal:        General: No deformity. Normal range of motion.     Cervical back: Normal range of motion and neck supple.  Skin:    General: Skin is warm and dry.     Findings: No erythema or rash.  Neurological:     Mental Status: He is alert and oriented to person, place, and time. Mental status is at baseline.     Cranial Nerves: No cranial nerve deficit.     Coordination: Coordination normal.  Psychiatric:         Mood and Affect: Mood normal.     LABORATORY DATA:  I have reviewed the data as listed Lab Results  Component Value Date   WBC 5.4 08/24/2020   HGB 8.4 (L) 08/24/2020   HCT 26.1 (L) 08/24/2020   MCV 69.2 (L) 08/24/2020   PLT 115 (L) 08/24/2020   Recent Labs    08/19/20 1436 08/24/20 1047  NA 137  --   K 3.4*  --   CL 106  --   CO2 23  --   GLUCOSE 108*  --   BUN 6  --   CREATININE 0.72  --   CALCIUM 8.1*  --   GFRNONAA >60  --   GFRAA >60  --  PROT  --  7.9  ALBUMIN  --  2.9*  AST  --  62*  ALT  --  29  ALKPHOS  --  106  BILITOT  --  1.5*  BILIDIR  --  0.5*  IBILI  --  1.0*   Iron/TIBC/Ferritin/ %Sat    Component Value Date/Time   IRON 13 (L) 08/19/2020 1818   TIBC 496 (H) 08/19/2020 1818   IRONPCTSAT 3 (L) 08/19/2020 1818      RADIOGRAPHIC STUDIES: I have personally reviewed the radiological images as listed and agreed with the findings in the report. No results found.    ASSESSMENT & PLAN:  1. Iron deficiency anemia, unspecified iron deficiency anemia type   2. Transaminitis   3. Alcohol use   4. Thrombocytopenia ([place])   5. B12 deficiency    #Iron deficiency anemia, Labs are reviewed and discussed with patient.  Hemoglobin has improved to 8.4 after blood transfusion. Iron saturation is 3, no ferritin was checked prior to the blood transfusion. Labs are consistent with severe iron deficiency. Plan IV iron with Venofer 200mg  weekly x 4 doses. Allergy reactions/infusion reaction including anaphylactic reaction discussed with patient. Other side effects include but not limited to high blood pressure, skin rash, weight gain, leg swelling, etc. Patient voices understanding and willing to proceed.  #Etiology of the iron deficiency-unknown.  Suspect GI blood loss. Patient is 37 and has never had colonoscopy screening.  I recommend patient to establish care with GI. Refer to Dr. Vicente Males  #Transaminitis, chronic alcohol use.  Alcohol cessation was  discussed with patient. Check ultrasound abdomen.  #Thrombocytopenia, check B12 and folate. B12 is borderline low.  Recommend patient to start oral vitamin B12 supplementation 1000 MCG daily.    Orders Placed This Encounter  Procedures  . US Abdomen Complete    Standing Status:   Future    Standing Expiration Date:   08/24/2021    Order Specific Question:   Reason for exam:    Answer:   transaminitis/alcohol use    Order Specific Question:   Preferred imaging location?    Answer:   [facility]  . Ambulatory referral to Gastroenterology    Referral Priority:   Routine    Referral Type:   Consultation    Referral Reason:   Specialty Services Required    Referred to Provider:   Jonathon Bellows, MD    Number of Visits Requested:   1    All questions were answered. The patient knows to call the clinic with any problems questions or concerns.  Return of visit: 6 weeks Thank you for this kind referral and the opportunity to participate in the care of this patient. A copy of today's note is routed to referring provider    Earlie Server, MD, PhD Hematology Oncology [facility] Pager- [phone] 08/24/2020

## 2020-08-24 NOTE — Progress Notes (Signed)
Pt hrere to establish care for anemia. No concerns voiced.

## 2020-08-25 ENCOUNTER — Telehealth: Payer: Self-pay

## 2020-08-25 NOTE — Telephone Encounter (Signed)
-----   Message from Earlie Server, MD sent at 08/24/2020  6:20 PM EDT ----- Please let him know that vitamin B12 level is on the low normal end.  I recommend patient to start taking oral vitamin B12 1000 MCG daily.  Prescription was sent to pharmacy.

## 2020-08-25 NOTE — Telephone Encounter (Signed)
Left message for patient to call for results. 

## 2020-08-26 ENCOUNTER — Telehealth: Payer: Self-pay | Admitting: *Deleted

## 2020-08-26 NOTE — Telephone Encounter (Signed)
Patient called today request a call with lab results.  I left a message for patient to call for lab results yesterday and again today (08/26/20).

## 2020-08-26 NOTE — Telephone Encounter (Signed)
Patient would like to be called with Lab results.

## 2020-08-26 NOTE — Telephone Encounter (Signed)
Left message for patient to call for lab results. 

## 2020-08-27 NOTE — Telephone Encounter (Signed)
Patient notified and voiced understanding.

## 2020-09-03 ENCOUNTER — Other Ambulatory Visit: Payer: Self-pay

## 2020-09-03 ENCOUNTER — Ambulatory Visit
Admission: RE | Admit: 2020-09-03 | Discharge: 2020-09-03 | Disposition: A | Discharge status: Home / Self Care | Payer: Self-pay | Source: Ambulatory Visit | Attending: Oncology | Admitting: Oncology

## 2020-09-03 ENCOUNTER — Inpatient Hospital Stay: Payer: Self-pay | Attending: Oncology

## 2020-09-03 VITALS — BP 137/78 | HR 78 | Resp 18

## 2020-09-03 DIAGNOSIS — R7401 Elevation of levels of liver transaminase levels: Secondary | ICD-10-CM | POA: Insufficient documentation

## 2020-09-03 DIAGNOSIS — D509 Iron deficiency anemia, unspecified: Secondary | ICD-10-CM | POA: Insufficient documentation

## 2020-09-03 MED ORDER — SODIUM CHLORIDE 0.9 % IV SOLN
Freq: Once | INTRAVENOUS | Status: AC
  Filled 2020-09-03: qty 250

## 2020-09-03 MED ORDER — IRON SUCROSE 20 MG/ML IV SOLN
200.0000 mg | Freq: Once | INTRAVENOUS | Status: AC
  Administered 2020-09-03: 200 mg via INTRAVENOUS
  Filled 2020-09-03: qty 10

## 2020-09-03 MED ORDER — SODIUM CHLORIDE 0.9 % IV SOLN: 200.0000 mg | Freq: Once | INTRAVENOUS | Status: DC

## 2020-09-11 ENCOUNTER — Other Ambulatory Visit: Payer: Self-pay

## 2020-09-11 ENCOUNTER — Inpatient Hospital Stay: Payer: Self-pay

## 2020-09-11 VITALS — BP 138/75 | HR 85 | Temp 98.9°F | Resp 18

## 2020-09-11 DIAGNOSIS — D509 Iron deficiency anemia, unspecified: Secondary | ICD-10-CM

## 2020-09-11 MED ORDER — SODIUM CHLORIDE 0.9 % IV SOLN
Freq: Once | INTRAVENOUS | Status: AC
  Filled 2020-09-11: qty 250

## 2020-09-11 MED ORDER — IRON SUCROSE 20 MG/ML IV SOLN
200.0000 mg | Freq: Once | INTRAVENOUS | Status: AC
  Administered 2020-09-11: 200 mg via INTRAVENOUS
  Filled 2020-09-11: qty 10

## 2020-09-11 MED ORDER — SODIUM CHLORIDE 0.9 % IV SOLN: 200.0000 mg | Freq: Once | INTRAVENOUS | Status: DC

## 2020-09-15 ENCOUNTER — Other Ambulatory Visit: Payer: Self-pay | Admitting: Oncology

## 2020-09-16 ENCOUNTER — Encounter: Payer: Self-pay | Admitting: Pharmacy Technician

## 2020-09-16 NOTE — Progress Notes (Signed)
Patient has been approved for drug assistance by [employer] for Feraheme. The enrollment period is from 09/16/20-09/15/21 based on self pay. First DOS covered is 09/18/20.

## 2020-09-18 ENCOUNTER — Other Ambulatory Visit: Payer: Self-pay

## 2020-09-18 ENCOUNTER — Inpatient Hospital Stay: Payer: Self-pay

## 2020-09-18 VITALS — BP 137/89 | HR 79 | Temp 97.5°F | Resp 20

## 2020-09-18 DIAGNOSIS — D509 Iron deficiency anemia, unspecified: Secondary | ICD-10-CM

## 2020-09-18 MED ORDER — SODIUM CHLORIDE 0.9 % IV SOLN
Freq: Once | INTRAVENOUS | Status: AC
  Filled 2020-09-18: qty 250

## 2020-09-18 MED ORDER — SODIUM CHLORIDE 0.9 % IV SOLN
510.0000 mg | Freq: Once | INTRAVENOUS | Status: AC
  Administered 2020-09-18: 510 mg via INTRAVENOUS
  Filled 2020-09-18: qty 510

## 2020-09-18 MED ORDER — SODIUM CHLORIDE 0.9% FLUSH
10.0000 mL | Freq: Once | INTRAVENOUS | Status: AC
  Administered 2020-09-18: 10 mL via INTRAVENOUS
  Filled 2020-09-18: qty 10

## 2020-09-18 MED ORDER — SODIUM CHLORIDE 0.9% FLUSH
3.0000 mL | Freq: Once | INTRAVENOUS | Status: DC | PRN
  Filled 2020-09-18: qty 3

## 2020-09-18 NOTE — Progress Notes (Signed)
Patient tolerated Iron (Ferraheme) infusion without complications.

## 2020-09-25 ENCOUNTER — Other Ambulatory Visit: Payer: Self-pay

## 2020-09-25 ENCOUNTER — Inpatient Hospital Stay: Payer: Self-pay

## 2020-09-25 VITALS — BP 153/83 | HR 80 | Temp 98.4°F

## 2020-09-25 DIAGNOSIS — D509 Iron deficiency anemia, unspecified: Secondary | ICD-10-CM

## 2020-09-25 MED ORDER — SODIUM CHLORIDE 0.9 % IV SOLN
510.0000 mg | Freq: Once | INTRAVENOUS | Status: AC
  Administered 2020-09-25: 510 mg via INTRAVENOUS
  Filled 2020-09-25: qty 510

## 2020-09-25 MED ORDER — SODIUM CHLORIDE 0.9 % IV SOLN
Freq: Once | INTRAVENOUS | Status: AC
  Filled 2020-09-25: qty 250

## 2020-09-25 NOTE — Progress Notes (Signed)
Pt tolerated infusion well. No s/s of distress or reaction noted. Pt and VS stable at discharge.  

## 2020-09-30 ENCOUNTER — Telehealth: Payer: Self-pay

## 2020-09-30 NOTE — Telephone Encounter (Signed)
Dr. Madaline Brilliant with moving appt out so patient can receive free medication. Patient has been notified of this and is ok with this.

## 2020-09-30 NOTE — Telephone Encounter (Signed)
-----   Message from Gwyndolyn Kaufman, [facility] sent at 09/30/2020  3:32 PM EDT ----- Regarding: RE: [place] They said the earliest they could fill it was 10/17/20. ----- Message ----- From: Evelina Dun, RN Sent: 09/30/2020  11:48 AM EDT To: Adelina Mings, RPH, Earlie Server, MD, # Subject: RE: Shirlean Kelly Assistance                        How far out will this need to be moved out?  ----- Message ----- From: Tempie Donning Sent: 09/29/2020   3:49 PM EDT To: Evelina Dun, RN, Adelina Mings, [place], # Subject: [place], We got Assistance on his Feraheme due to self pay status. We were able to get him approved for more than the 2 dose initial course today. He has refills but once his last fill was 09/16/20 they will not fill his order yet. They state has to be 30 days from fill to be able to re-order. Is it possible to reschedule him so we can get this in for him so he is not paying for out of pocket. His next appt is 11/9 which we will not be able to have free medication for. Please advise.

## 2020-10-01 NOTE — Telephone Encounter (Signed)
Done. Appts were pushed out as requested. I was unable to reach pt to make him aware of his new appt date and time A NEW appt reminder letter will be mailed out making him aware.

## 2020-10-01 NOTE — Telephone Encounter (Signed)
Detailed left on VM.

## 2020-10-01 NOTE — Telephone Encounter (Signed)
Received notification from Elmon Else, [facility]:  He just got Ferahame on 09/25/20  So around 10/26/20   Please reschedule him after 11/29 & notify pt of appts please.

## 2020-10-02 ENCOUNTER — Inpatient Hospital Stay: Payer: Self-pay

## 2020-10-05 ENCOUNTER — Inpatient Hospital Stay: Payer: Self-pay | Admitting: Oncology

## 2020-10-06 ENCOUNTER — Ambulatory Visit: Payer: Self-pay

## 2020-10-20 NOTE — Progress Notes (Addendum)
..  Feraheme is approved for refills. Effective 09/15/2020 to 09/15/2021. Additional refills requested 10/19/2020. DOS: 10/30/2020,      Next DOS: 11/06/2020. Refills, application needed with sections 1,3,4,5,6 filled out and sent in for approval.

## 2020-10-29 ENCOUNTER — Inpatient Hospital Stay: Payer: Self-pay

## 2020-10-30 ENCOUNTER — Inpatient Hospital Stay: Payer: Self-pay | Admitting: Oncology

## 2020-10-30 ENCOUNTER — Telehealth: Payer: Self-pay

## 2020-10-30 NOTE — Telephone Encounter (Signed)
Done.. Appts has been R/S Spoke with pt in reference to his missed appts Dates per pt request

## 2020-10-30 NOTE — Telephone Encounter (Signed)
Patient did not come for labs on 10/29/20.  Please r/s the virtual MD visit on 12/3 and Feraheme on 12/6.  Patient is aware and would like a call with appt details.

## 2020-11-02 ENCOUNTER — Inpatient Hospital Stay: Payer: Self-pay

## 2020-11-26 ENCOUNTER — Other Ambulatory Visit: Payer: Self-pay

## 2020-11-26 DIAGNOSIS — D509 Iron deficiency anemia, unspecified: Secondary | ICD-10-CM

## 2020-11-26 NOTE — Progress Notes (Signed)
..  Feraheme was approved for additional refills on 11/25/2020. Approved for single dose therapy (2 Vials).  For DOS: 12/03/2020, Next DOS: 12/11/2020. Additional Fills requires Application filled out from Provider sections 1,3, 4,5,6 and send to [employer].

## 2020-11-30 ENCOUNTER — Inpatient Hospital Stay: Payer: Self-pay | Attending: Oncology

## 2020-12-01 ENCOUNTER — Telehealth: Payer: Self-pay

## 2020-12-01 NOTE — Telephone Encounter (Signed)
Pt did not come for labs on 1/3.  Scheduled for mychart on 1/5 and feraheme on 1/6. Please call pt to see if he can come tomorrow morning for labs in order to keep Mychart and feraheme as scheduled. If unable to come, he will need to be rescheduled: lab/ Mychart/ feraheme.

## 2020-12-02 ENCOUNTER — Inpatient Hospital Stay: Payer: Self-pay | Admitting: Oncology

## 2020-12-02 ENCOUNTER — Other Ambulatory Visit: Payer: Self-pay

## 2020-12-02 NOTE — Telephone Encounter (Signed)
Please contact pt to reschedule per pt availability: lab -day 1/ Mychart (afternoon) -day 2/ ferahme -day 3. Thanks

## 2020-12-03 ENCOUNTER — Inpatient Hospital Stay: Payer: Self-pay

## 2020-12-03 NOTE — Telephone Encounter (Signed)
Done Pt NS 12/02/20 appts has been R/S out for 2.5 weeks. However, I was unable  to reach him to make him aware. A NEW appt letter will be mailed out making him aware.

## 2020-12-21 ENCOUNTER — Telehealth: Payer: Self-pay

## 2020-12-21 ENCOUNTER — Inpatient Hospital Stay: Payer: Self-pay

## 2020-12-21 NOTE — Telephone Encounter (Signed)
Patient did not come for labs today and is scheduled for virtual visit tomorrow with possible Ferameme on 1/26.    This is his 3rd missed appointment.  Dr. Tasia Catchings, how would you like to proceed with his f/u appts?

## 2020-12-22 ENCOUNTER — Encounter: Payer: Self-pay | Admitting: Oncology

## 2020-12-22 ENCOUNTER — Inpatient Hospital Stay: Payer: Self-pay | Admitting: Oncology

## 2020-12-22 NOTE — Telephone Encounter (Signed)
Done..  Letter has been noted

## 2020-12-22 NOTE — Telephone Encounter (Signed)
Dr. Tasia Catchings, how would you like to proceed with his f/u appts? This is his 3rd missed appointment.

## 2020-12-22 NOTE — Telephone Encounter (Signed)
Dr. Tasia Catchings recommends:  send letter, no need to reschedule

## 2020-12-23 ENCOUNTER — Inpatient Hospital Stay: Payer: Self-pay

## 2021-02-16 ENCOUNTER — Other Ambulatory Visit: Payer: Self-pay

## 2021-02-16 ENCOUNTER — Inpatient Hospital Stay
Admission: EM | Admit: 2021-02-16 | Discharge: 2021-02-19 | DRG: 432 | Disposition: A | Discharge status: Home / Self Care | Payer: BC Managed Care – PPO | Attending: Internal Medicine | Admitting: Internal Medicine

## 2021-02-16 DIAGNOSIS — K7030 Alcoholic cirrhosis of liver without ascites: Secondary | ICD-10-CM | POA: Diagnosis not present

## 2021-02-16 DIAGNOSIS — K922 Gastrointestinal hemorrhage, unspecified: Secondary | ICD-10-CM

## 2021-02-16 DIAGNOSIS — K7460 Unspecified cirrhosis of liver: Secondary | ICD-10-CM

## 2021-02-16 DIAGNOSIS — N179 Acute kidney failure, unspecified: Secondary | ICD-10-CM | POA: Diagnosis present

## 2021-02-16 DIAGNOSIS — K3189 Other diseases of stomach and duodenum: Secondary | ICD-10-CM | POA: Diagnosis present

## 2021-02-16 DIAGNOSIS — I248 Other forms of acute ischemic heart disease: Secondary | ICD-10-CM | POA: Diagnosis present

## 2021-02-16 DIAGNOSIS — Z7289 Other problems related to lifestyle: Secondary | ICD-10-CM

## 2021-02-16 DIAGNOSIS — K766 Portal hypertension: Secondary | ICD-10-CM | POA: Diagnosis present

## 2021-02-16 DIAGNOSIS — R778 Other specified abnormalities of plasma proteins: Secondary | ICD-10-CM | POA: Diagnosis not present

## 2021-02-16 DIAGNOSIS — R571 Hypovolemic shock: Secondary | ICD-10-CM | POA: Diagnosis not present

## 2021-02-16 DIAGNOSIS — D62 Acute posthemorrhagic anemia: Secondary | ICD-10-CM | POA: Diagnosis present

## 2021-02-16 DIAGNOSIS — R739 Hyperglycemia, unspecified: Secondary | ICD-10-CM | POA: Diagnosis not present

## 2021-02-16 DIAGNOSIS — I361 Nonrheumatic tricuspid (valve) insufficiency: Secondary | ICD-10-CM | POA: Diagnosis not present

## 2021-02-16 DIAGNOSIS — D509 Iron deficiency anemia, unspecified: Secondary | ICD-10-CM | POA: Diagnosis present

## 2021-02-16 DIAGNOSIS — Z6834 Body mass index (BMI) 34.0-34.9, adult: Secondary | ICD-10-CM | POA: Diagnosis not present

## 2021-02-16 DIAGNOSIS — D649 Anemia, unspecified: Secondary | ICD-10-CM | POA: Diagnosis present

## 2021-02-16 DIAGNOSIS — K7031 Alcoholic cirrhosis of liver with ascites: Principal | ICD-10-CM | POA: Diagnosis present

## 2021-02-16 DIAGNOSIS — E872 Acidosis: Secondary | ICD-10-CM | POA: Diagnosis present

## 2021-02-16 DIAGNOSIS — Z01818 Encounter for other preprocedural examination: Secondary | ICD-10-CM

## 2021-02-16 DIAGNOSIS — I8511 Secondary esophageal varices with bleeding: Secondary | ICD-10-CM | POA: Diagnosis not present

## 2021-02-16 DIAGNOSIS — Z789 Other specified health status: Secondary | ICD-10-CM

## 2021-02-16 DIAGNOSIS — D6959 Other secondary thrombocytopenia: Secondary | ICD-10-CM | POA: Diagnosis present

## 2021-02-16 DIAGNOSIS — Z8711 Personal history of peptic ulcer disease: Secondary | ICD-10-CM | POA: Diagnosis not present

## 2021-02-16 DIAGNOSIS — F1010 Alcohol abuse, uncomplicated: Secondary | ICD-10-CM | POA: Diagnosis present

## 2021-02-16 DIAGNOSIS — I8510 Secondary esophageal varices without bleeding: Secondary | ICD-10-CM | POA: Diagnosis not present

## 2021-02-16 DIAGNOSIS — R578 Other shock: Secondary | ICD-10-CM | POA: Diagnosis present

## 2021-02-16 DIAGNOSIS — E1165 Type 2 diabetes mellitus with hyperglycemia: Secondary | ICD-10-CM | POA: Diagnosis present

## 2021-02-16 DIAGNOSIS — Z20822 Contact with and (suspected) exposure to covid-19: Secondary | ICD-10-CM | POA: Diagnosis present

## 2021-02-16 DIAGNOSIS — R7989 Other specified abnormal findings of blood chemistry: Secondary | ICD-10-CM | POA: Diagnosis not present

## 2021-02-16 DIAGNOSIS — F1020 Alcohol dependence, uncomplicated: Secondary | ICD-10-CM | POA: Diagnosis present

## 2021-02-16 DIAGNOSIS — R7401 Elevation of levels of liver transaminase levels: Secondary | ICD-10-CM

## 2021-02-16 HISTORY — DX: Peptic ulcer, site unspecified, unspecified as acute or chronic, without hemorrhage or perforation: K27.9

## 2021-02-16 HISTORY — DX: Gastrointestinal hemorrhage, unspecified: K92.2

## 2021-02-16 LAB — BASIC METABOLIC PANEL
Anion gap: 11 (ref 5–15)
BUN: 28 mg/dL — ABNORMAL HIGH (ref 6–20)
CO2: 19 mmol/L — ABNORMAL LOW (ref 22–32)
Calcium: 8.1 mg/dL — ABNORMAL LOW (ref 8.9–10.3)
Chloride: 107 mmol/L (ref 98–111)
Creatinine, Ser: 1.11 mg/dL (ref 0.61–1.24)
GFR, Estimated: >60 mL/min (ref >60)
Glucose, Bld: 274 mg/dL — ABNORMAL HIGH (ref 70–99)
Potassium: 4.4 mmol/L (ref 3.5–5.1)
Sodium: 137 mmol/L (ref 135–145)

## 2021-02-16 LAB — CBC WITH DIFFERENTIAL/PLATELET
Abs Immature Granulocytes: 0.05 10*3/uL (ref 0.00–0.07)
Basophils Absolute: 0.1 10*3/uL (ref 0.0–0.1)
Basophils Relative: 1 %
Eosinophils Absolute: 0 10*3/uL (ref 0.0–0.5)
Eosinophils Relative: 0 %
HCT: 26.3 % — ABNORMAL LOW (ref 39.0–52.0)
Hemoglobin: 8.7 g/dL — ABNORMAL LOW (ref 13.0–17.0)
Immature Granulocytes: 1 %
Lymphocytes Relative: 14 %
Lymphs Abs: 1.4 10*3/uL (ref 0.7–4.0)
MCH: 30.4 pg (ref 26.0–34.0)
MCHC: 33.1 g/dL (ref 30.0–36.0)
MCV: 92 fL (ref 80.0–100.0)
Monocytes Absolute: 1.2 10*3/uL — ABNORMAL HIGH (ref 0.1–1.0)
Monocytes Relative: 12 %
Neutro Abs: 7.3 10*3/uL (ref 1.7–7.7)
Neutrophils Relative %: 72 %
Platelets: 148 10*3/uL — ABNORMAL LOW (ref 150–400)
RBC: 2.86 MIL/uL — ABNORMAL LOW (ref 4.22–5.81)
RDW: 18.2 % — ABNORMAL HIGH (ref 11.5–15.5)
WBC: 10 10*3/uL (ref 4.0–10.5)
nRBC: 0 % (ref 0.0–0.2)

## 2021-02-16 LAB — RESP PANEL BY RT-PCR (FLU A&B, COVID) ARPGX2
Influenza A by PCR: NEGATIVE
Influenza B by PCR: NEGATIVE
SARS Coronavirus 2 by RT PCR: NEGATIVE

## 2021-02-16 LAB — TYPE AND SCREEN

## 2021-02-16 LAB — PROTIME-INR
INR: 1.4 — ABNORMAL HIGH (ref 0.8–1.2)
Prothrombin Time: 16.6 seconds — ABNORMAL HIGH (ref 11.4–15.2)

## 2021-02-16 LAB — APTT: aPTT: 34 seconds (ref 24–36)

## 2021-02-16 MED ORDER — SODIUM CHLORIDE 0.9 % IV BOLUS
1000.0000 mL | Freq: Once | INTRAVENOUS | Status: AC
  Administered 2021-02-16: 1000 mL via INTRAVENOUS

## 2021-02-16 MED ORDER — SODIUM CHLORIDE 0.9 % IV SOLN
1.0000 g | Freq: Once | INTRAVENOUS | Status: AC
  Administered 2021-02-16: 1 g via INTRAVENOUS
  Filled 2021-02-16: qty 10

## 2021-02-16 MED ORDER — LORAZEPAM 2 MG PO TABS
0.0000 mg | ORAL_TABLET | Freq: Four times a day (QID) | ORAL | Status: DC
  Filled 2021-02-16: qty 1

## 2021-02-16 MED ORDER — THIAMINE HCL 100 MG/ML IJ SOLN
100.0000 mg | Freq: Every day | INTRAMUSCULAR | Status: DC
  Administered 2021-02-18: 100 mg via INTRAVENOUS
  Filled 2021-02-16: qty 2

## 2021-02-16 MED ORDER — SODIUM CHLORIDE 0.9 % IV SOLN
8.0000 mg/h | INTRAVENOUS | Status: DC
  Administered 2021-02-16 – 2021-02-19 (×6): 8 mg/h via INTRAVENOUS
  Filled 2021-02-16 (×6): qty 80

## 2021-02-16 MED ORDER — LORAZEPAM 2 MG PO TABS: 0.0000 mg | ORAL_TABLET | Freq: Two times a day (BID) | ORAL | Status: DC

## 2021-02-16 MED ORDER — THIAMINE HCL 100 MG PO TABS
100.0000 mg | ORAL_TABLET | Freq: Every day | ORAL | Status: DC
  Administered 2021-02-19: 100 mg via ORAL
  Filled 2021-02-16: qty 1

## 2021-02-16 MED ORDER — OCTREOTIDE LOAD VIA INFUSION
50.0000 ug | Freq: Once | INTRAVENOUS | Status: AC
  Administered 2021-02-16: 50 ug via INTRAVENOUS
  Filled 2021-02-16: qty 25

## 2021-02-16 MED ORDER — INSULIN ASPART 100 UNIT/ML ~~LOC~~ SOLN
0.0000 [IU] | SUBCUTANEOUS | Status: DC
  Administered 2021-02-17 (×3): 2 [IU] via SUBCUTANEOUS
  Administered 2021-02-18: 1 [IU] via SUBCUTANEOUS
  Administered 2021-02-18 (×4): 2 [IU] via SUBCUTANEOUS
  Filled 2021-02-16 (×8): qty 1

## 2021-02-16 MED ORDER — SODIUM CHLORIDE 0.9 % IV SOLN
80.0000 mg | Freq: Once | INTRAVENOUS | Status: AC
  Administered 2021-02-16: 22:00:00 80 mg via INTRAVENOUS
  Filled 2021-02-16: qty 80

## 2021-02-16 MED ORDER — LORAZEPAM 2 MG/ML IJ SOLN: 0.0000 mg | Freq: Four times a day (QID) | INTRAMUSCULAR | Status: DC

## 2021-02-16 MED ORDER — PANTOPRAZOLE SODIUM 40 MG IV SOLR: 40.0000 mg | Freq: Two times a day (BID) | INTRAVENOUS | Status: DC

## 2021-02-16 MED ORDER — SODIUM CHLORIDE 0.9 % IV SOLN
50.0000 ug/h | INTRAVENOUS | Status: DC
  Administered 2021-02-16 – 2021-02-19 (×6): 50 ug/h via INTRAVENOUS
  Filled 2021-02-16 (×12): qty 1

## 2021-02-16 MED ORDER — LORAZEPAM 2 MG/ML IJ SOLN: 0.0000 mg | Freq: Two times a day (BID) | INTRAMUSCULAR | Status: DC

## 2021-02-16 NOTE — H&P (Signed)
Terrence Martin ZOX:[id number] DOB: May 30, 1966 DOA: 02/16/2021    PCP: System, Provider Not In   Outpatient Specialists:   NONE    Patient arrived to ER on 02/16/21 at 2052 Referred by Attending Nance Pear, MD   Patient coming from: home Lives    With SO    Chief Complaint:   Chief Complaint  Patient presents with  . Rectal Bleeding    HPI: Terrence Martin is a 55 y.o. male with medical history significant of PUD and anemia, alcohol abuse    Presented with   Black tarry stools since 7 AM and bright red blood in vomit x 3 large amount each with large amount of clots  EMS was called and gave 400 ml IV fluids  Hx of GI bleed in [place] refused endoscopy at that time, was treated with blood transfusion and stabalized He continues to drink 3-4 beers per day and 12 pack on the weekends  Denies any current chest pain but feels lightheaded. Has been having dark melanotic stools ever since this morning. Stop drinking on 21 March  Infectious risk factors:  Reports , N/V      Initial COVID TEST  NEGATIVE   Lab Results  Component Value Date   [place] NEGATIVE 02/16/2021   [place] NEGATIVE 08/19/2020     Regarding pertinent Chronic problems:    obesity-   BMI Readings from Last 1 Encounters:  02/16/21 34.46 kg/m      Liver disease - likely due to ongoing EtOh abuse    Chronic anemia - baseline hg Hemoglobin & Hematocrit  Recent Labs    08/19/20 1818 08/24/20 1047 02/16/21 2105  HGB 7.4* 8.4* 8.7*     While in ER: initially tachycardiac up to 133 HG at 8.7 stable from last year BUN up to 28  Started on octreotide, Protonix, rocephin  Patient on my evaluation noted to be diaphoretic just had a massive vomitus productive of large amount of blood clots blood pressure dropped into the 60s patient tachycardic into 130s Pale and diaphoretic Repeat CBC 6.6 Elevated troponin Lactic acid 9.6  Given stat bolus IV fluids,3 peripheral IV, Blood 2 units, with  2 units on-call and FFP ordered Stat ICU consult Stat GI consult     ED Triage Vitals  Enc Vitals Group     BP 02/16/21 2058 135/66     Pulse Rate 02/16/21 2058 (!) 133     Resp 02/16/21 2058 18     Temp 02/16/21 2058 98.3 F (36.8 C)     Temp Source 02/16/21 2058 Oral     SpO2 02/16/21 2058 100 %     Weight 02/16/21 2103 220 lb (99.8 kg)     Height 02/16/21 2103 5\' 7"  (1.702 m)     Head Circumference --      Peak Flow --      Pain Score 02/16/21 2059 0     Pain Loc --      Pain Edu? --      Excl. in [place]? --   TMAX(24)@     _________________________________________ Significant initial  Findings: Abnormal Labs Reviewed  CBC WITH DIFFERENTIAL/PLATELET - Abnormal; Notable for the following components:      Result Value   RBC 2.86 (*)    Hemoglobin 8.7 (*)    HCT 26.3 (*)    RDW 18.2 (*)    Platelets 148 (*)    Monocytes Absolute 1.2 (*)    All other components within normal limits  BASIC METABOLIC PANEL - Abnormal; Notable for the following components:   CO2 19 (*)    Glucose, Bld 274 (*)    BUN 28 (*)    Calcium 8.1 (*)    All other components within normal limits  PROTIME-INR - Abnormal; Notable for the following components:   Prothrombin Time 16.6 (*)    INR 1.4 (*)    All other components within normal limits   ____________________________________________ Ordered   _________________________   ECG: Ordered Personally reviewed by me showing: HR : 135  Rhythm Sinus tachycardia   no evidence of ischemic changes QTC 482    The recent clinical data is shown below. Vitals:   02/16/21 2100 02/16/21 2103 02/16/21 2223 02/16/21 2239  BP: 131/78  120/60 (!) 120/52  Pulse: (!) 134  (!) 135 (!) 124  Resp: (!) 25  20   Temp:      TempSrc:      SpO2: 100%  100%   Weight:  99.8 kg    Height:  5\' 7"  (1.702 m)       WBC     Component Value Date/Time   WBC 10.0 02/16/2021 2105   LYMPHSABS 1.4 02/16/2021 2105   MONOABS 1.2 (H) 02/16/2021 2105   EOSABS 0.0  02/16/2021 2105   BASOSABS 0.1 02/16/2021 2105     Lactic Acid, Venous    Component Value Date/Time   LATICACIDVEN 9.7 ([place]) 02/17/2021 0057     UA not ordered     Results for orders placed or performed during the hospital encounter of 02/16/21  Resp Panel by RT-PCR (Flu A&B, Covid) Nasopharyngeal Swab     Status: None   Collection Time: 02/16/21 10:26 PM   Specimen: Nasopharyngeal Swab; Nasopharyngeal(NP) swabs in vial transport medium  Result Value Ref Range Status   SARS Coronavirus 2 by RT PCR NEGATIVE NEGATIVE Final         Influenza A by PCR NEGATIVE NEGATIVE Final   Influenza B by PCR NEGATIVE NEGATIVE Final          __________________ Hospitalist was called for admission for Upper Gi bleed  The following Work up has been ordered so far:  Orders Placed This Encounter  Procedures  . Resp Panel by RT-PCR (Flu A&B, Covid) Nasopharyngeal Swab  . CBC with Differential  . Basic metabolic panel  . Protime-INR  . APTT  . Consult to hospitalist  . Airborne and Contact precautions  . EKG 12-Lead  . Type and screen [place]  . Type and screen     Following Medications were ordered in ER: Medications  pantoprazole (PROTONIX) 80 mg in sodium chloride 0.9 % 100 mL (0.8 mg/mL) infusion (8 mg/hr Intravenous New Bag/Given 02/16/21 2250)  pantoprazole (PROTONIX) injection 40 mg (has no administration in time range)  octreotide (SANDOSTATIN) 2 mcg/mL load via infusion 50 mcg (has no administration in time range)    And  octreotide (SANDOSTATIN) 500 mcg in sodium chloride 0.9 % 250 mL (2 mcg/mL) infusion (has no administration in time range)  cefTRIAXone (ROCEPHIN) 1 g in sodium chloride 0.9 % 100 mL IVPB (1 g Intravenous New Bag/Given 02/16/21 2247)  pantoprazole (PROTONIX) 80 mg in sodium chloride 0.9 % 100 mL IVPB (0 mg Intravenous Stopped 02/16/21 2251)  sodium chloride 0.9 % bolus 1,000 mL (1,000 mLs Intravenous New Bag/Given 02/16/21 2241)         Consult Orders  (From admission, onward)         Start  Ordered   02/16/21 2243  Consult to hospitalist  Once       Provider:  (Not yet assigned)  Question Answer Comment  Place call to: hospitalist   Reason for Consult Consult   Diagnosis/Clinical Info for Consult: GI bleed      02/16/21 2242            OTHER Significant initial  Findings:  labs showing:    Recent Labs  Lab 02/16/21 2105  NA 137  K 4.4  CO2 19*  GLUCOSE 274*  BUN 28*  CREATININE 1.11  CALCIUM 8.1*   Cr   Up from baseline see below Lab Results  Component Value Date   CREATININE 1.11 02/16/2021   CREATININE 0.72 08/19/2020   CREATININE 0.70 11/09/2016    No results for input(s): AST, ALT, ALKPHOS, BILITOT, PROT, ALBUMIN in the last 168 hours. Lab Results  Component Value Date   CALCIUM 8.1 (L) 02/16/2021      Plt: Lab Results  Component Value Date   PLT 148 (L) 02/16/2021      Recent Labs  Lab 02/16/21 2105  WBC 10.0  NEUTROABS 7.3  HGB 8.7*  HCT 26.3*  MCV 92.0  PLT 148*    HG/HCT  stable,       Component Value Date/Time   HGB 8.7 (L) 02/16/2021 2105   HCT 26.3 (L) 02/16/2021 2105   MCV 92.0 02/16/2021 2105      Recent Labs  Lab 02/16/21 2105  LIPASE 31   No results for input(s): AMMONIA in the last 168 hours.   Cardiac Panel (last 3 results) Recent Labs    02/16/21 2105  CKTOTAL 365     BNP (last 3 results) Recent Labs    08/19/20 1729  BNP 83.0      DM  labs:  HbA1C: No results for input(s): HGBA1C in the last 8760 hours.     CBG (last 3)  Recent Labs    02/17/21 0011  GLUCAP 184*        Radiological Exams on Admission: No results found. _______________________________________________________________________________________________________ Latest  Blood pressure (!) 120/52, pulse (!) 124, temperature 98.3 F (36.8 C), temperature source Oral, resp. rate 20, height 5\' 7"  (1.702 m), weight 99.8 kg, SpO2 100 %.   Review of Systems:     Pertinent positives include:   Fatigue,  abdominal pain, nausea, vomiting,   hematemesis  melena Constitutional:  No weight loss, night sweats, Fevers, chills, weight loss  HEENT:  No headaches, Difficulty swallowing,Tooth/dental problems,Sore throat,  No sneezing, itching, ear ache, nasal congestion, post nasal drip,  Cardio-vascular:  No chest pain, Orthopnea, PND, anasarca, dizziness, palpitations.no Bilateral lower extremity swelling  GI:  No heartburn, indigestion,diarrhea, change in bowel habits, loss of appetite, , blood in stool,s Resp:  no shortness of breath at rest. No dyspnea on exertion, No excess mucus, no productive cough, No non-productive cough, No coughing up of blood.No change in color of mucus.No wheezing. Skin:  no rash or lesions. No jaundice GU:  no dysuria, change in color of urine, no urgency or frequency. No straining to urinate.  No flank pain.  Musculoskeletal:  No joint pain or no joint swelling. No decreased range of motion. No back pain.  Psych:  No change in mood or affect. No depression or anxiety. No memory loss.  Neuro: no localizing neurological complaints, no tingling, no weakness, no double vision, no gait abnormality, no slurred speech, no confusion  All systems reviewed and apart from  HOPI all are negative _______________________________________________________________________________________________ Past Medical History:   Past Medical History:  Diagnosis Date  . IDA (iron deficiency anemia) 08/24/2020      History reviewed. No pertinent surgical history.  Social History:  Ambulatory  Independently      reports that he has never smoked. He has never used smokeless tobacco. He reports previous alcohol use. He reports that he does not use drugs.   Family History:   Family History  Problem Relation Age of Onset  . Lung cancer Mother   . Cancer Father   . Heart attack Brother     ______________________________________________________________________________________________ Allergies: No Known Allergies   Prior to Admission medications   Medication Sig Start Date End Date Taking? Authorizing Provider  ferrous sulfate 325 (65 FE) MG tablet Take 1 tablet (325 mg total) by mouth 2 (two) times daily with a meal. 08/19/20 12/17/20  Harvest Dark, MD  vitamin B-12 (CYANOCOBALAMIN) 1000 MCG tablet Take 1 tablet (1,000 mcg total) by mouth daily. 08/24/20   Earlie Server, MD    ___________________________________________________________________________________________________ Physical Exam: Vitals with BMI 02/16/2021 02/16/2021 02/16/2021  Height - - 5\' 7"   Weight - - 220 lbs  BMI - - 61.60  Systolic 737 106 -  Diastolic 52 60 -  Pulse 269 135 -     1. General:  in No  Acute distress   Chronically ill  -appearing 2. Psychological: Alert and  Oriented 3. Head/ENT:    Dry Mucous Membranes                          Head Non traumatic, neck supple                          Normal  Dentition 4. SKIN:  decreased Skin turgor,  Skin clean Dry and intact no rash 5. Heart: Regular rate and rhythm no Murmur, no Rub or gallop 6. Lungs:   no wheezes or crackles   7. Abdomen: Soft, non-tender, Non distended bowel sounds present 8. Lower extremities: no clubbing, cyanosis, no   edema 9. Neurologically Grossly intact, moving all 4 extremities equally  10. MSK: Normal range of motion  mild tremor  Chart has been reviewed  ______________________________________________________________________________________________  Assessment/Plan   55 y.o. male with medical history significant of PUD and anemia, alcohol abuse     Admitted for  Upper gi bleed hypovolemic shock and acute blood loss   Present on Admission:  . Upper GI bleed -  Patient consenting to blood administration but refusing at this point EGD Spoke at length about importance of procedure endoscopy in order to  assist of stopping bleeding.  Discussed with patient risk of death without endoscopy.  Patient states that he understands what he is very reluctant at this time.  Spent about 20 minutes discussing this with patient.  Nurse practitioner of ICU also had extensive discussion with the patient.  For now patient wishes to be full code but does not wish to have an endoscopy. GI has been notified Recommended if patient to agree to endoscopy will need to be transferred to ICU and intubated for airway control Continue Protonix drip octreotide and Rocephin Patient has been transferred to ICU  - [place] score BUN >18.2   Hg <62M  ,   HR >100    Melena hepatic disease    >1 Justifies admission and aggressive management      Modifying risk  factors include:    NSAIDS use   hx of PUD   alcohol abuse   liver disease   Prior hx of GI bleed      -     hemodynamic instability present      - Admit to ICU    - spoke at length with   gastroenterology   appreciate their consult   - serial CBC.    - Monitor for any recurrence,  evidence of hemodynamic instability or significant blood loss  - Transfuse as needed for hemoglobin below 7 or evidence of life-threatening bleeding  - Establish at least 2 PIV and fluid resuscitate   - clear liquids for tonight keep nothing by mouth post midnight,   -  administer Protonix  drip   -  Administer octreotide given possibility of variceal bleeding,    -   Given suspected Variceal bleed initiate antibiotics  IV Cefrtiaxone     . Anemia -acute blood loss anemia resulting in hypovolemic shock.  Transfuse 2 units with 2 more ready.  FFP ordered CBC every 6 hours  Elevated lactic acid in the setting of hypovolemic shock continue IV fluids and continue to follow, lactic acid could also be more elevated in the setting of liver disease  . Alcohol abuse - order CIWA protocol, spoke re importance of quitng  Elevated troponin in the setting of hypovolemic shock most  likely demand ischemia patient this point endorsing chest pain obtain echogram continue to follow troponin not a candidate for anticoagulation given profuse GI bleeding  Hyperglycemia - order HgA1c, SSI likely new diagnosis of DM 2 No evidence of DKA at this time  Other plan as per orders.  DVT prophylaxis:  SCD       Code Status:    Code Status: Not on file FULL CODE  as per patient    I had personally discussed CODE STATUS with patient and family    Family Communication: Significant other   at  Bedside   Disposition Plan:          To home once workup is complete and patient is stable   Following barriers for discharge:                            Electrolytes corrected                               Anemia stable                                                          Will need to be able to tolerate PO                                                   Will need consultants to evaluate patient prior to discharge                 Would benefit from  Transition of care consulted                                   Consults called: Spoke with Dr.Tahilaini Gi well aware of the patient ready to perform emergent endoscopy if patient decompensates and agreeable to endoscopy. At this point patient still refusing endoscopy despite extensive discussions and being aware of risk of death.  Patient states understanding  Admission status:  ED Disposition    ED Disposition Condition [place]: [place] [[id number]]  Level of Care: Progressive Cardiac [[id number]]  Admit to Progressive based on following criteria: GI, ENDOCRINE disease patients with GI bleeding, acute liver failure or pancreatitis, stable with diabetic ketoacidosis or thyrotoxicosis (hypothyroid) state.  Covid Evaluation: Asymptomatic Screening Protocol (No Symptoms)  Diagnosis: Upper GI bleed [[id number]]  Admitting Physician: Toy Baker [[id number]]   Attending Physician: Toy Baker [[id number]]  Estimated length of stay: past midnight tomorrow  Certification:: I certify this patient will need inpatient services for at least 2 midnights         inpatient     I Expect 2 midnight stay secondary to severity of patient's current illness need for inpatient interventions justified by the following:  hemodynamic instability despite optimal treatment (tachycardia  )  Severe lab/radiological/exam abnormalities including:    anemia and extensive comorbidities including:  substance abuse     Morbid Obesity    liver disease .      That are currently affecting medical management.   I expect  patient to be hospitalized for 2 midnights requiring inpatient medical care.  Patient is at high risk for adverse outcome (such as loss of life or disability) if not treated.  Indication for inpatient stay as follows:    Hemodynamic instability despite maximal medical therapy,      inability to maintain oral hydration    Need for operative/procedural  intervention    Need for IV antibiotics, IV fluids, IV PPI   Level of care   Progressive  tele indefinitely please discontinue once patient no longer qualifies COVID-19 Labs    Lab Results  Component Value Date   [place] 02/16/2021     Precautions: admitted as   Covid Negative     PPE: Used by the provider:   N95  eye Goggles,  Gloves      Isa Kohlenberg 02/17/2021, 1:54 AM    [facility]     after 2 AM please page floor coverage PA If 7AM-7PM, please contact the day team taking care of the patient using [email or web address]   Patient was evaluated in the context of the global COVID-19 pandemic, which necessitated consideration that the patient might be at risk for infection with the SARS-CoV-2 virus that causes COVID-19. Institutional protocols and algorithms that pertain to the evaluation of patients at risk for COVID-19 are in a state of rapid change based  on information released by regulatory bodies including the CDC and federal and state organizations. These policies and algorithms were followed during the patient's care.

## 2021-02-16 NOTE — ED Triage Notes (Signed)
Pt BIB EMS, pt complaint of 5-6 black, tarry stools today since 0700. Also complaint of bright red blood in vomit x5. Hx of GI ulcers and anemia. Given 411mL of NS with EMS. Pt denies pain at this time.

## 2021-02-16 NOTE — H&P (Incomplete)
[place] KGU:[id number] DOB: Jul 23, 1966 DOA: 02/16/2021     PCP: System, Provider Not In   Outpatient Specialists: * NONE CARDS: * Dr. NEphrology: *  Dr. NEurology *   Dr. Pulmonary *  Dr.  Oncology * Dr. Fabienne Bruns* Dr.  Sadie Haber, [facility]) Urology Dr. *  Patient arrived to ER on 02/16/21 at 2052 Referred by Attending Nance Pear, MD   Patient coming from: home Lives alone,   *** With family From facility ***  Chief Complaint: *** Chief Complaint  Patient presents with  . Rectal Bleeding    HPI: Terrence Martin is a 55 y.o. male with medical history significant of PUD and anemia, alcohol abuse    Presented with   Black tarry stools since 7 AM and bright red blood in vomit x5  EMS was called and gave 400 ml IV fluids  Hx of GI bleed in [place] refused endoscopy He continues to drink 6-7 beers per day   Infectious risk factors:  Reports , N/V/    ***KNOWN COVID POSITIVE   Has ***NOt been vaccinated against COVID **and boosted   Initial COVID TEST  NEGATIVE**** POSITIVE,  ***in house  PCR testing  Pending  Lab Results  Component Value Date   [place] NEGATIVE 08/19/2020     Regarding pertinent Chronic problems: ***  ****Hyperlipidemia - *on statins Lipid Panel  No results found for: CHOL, TRIG, HDL, CHOLHDL, VLDL, LDLCALC, LDLDIRECT, LABVLDL  ***HTN on   ***chronic CHF diastolic/systolic/ combined - last echo***  *** CAD  - On Aspirin, statin, betablocker, Plavix                 - *followed by cardiology                - last cardiac cath  The ASCVD Risk score ([place]., et al., 2013) failed to calculate for the following reasons:   Cannot find a previous HDL lab   Cannot find a previous total cholesterol lab    ***DM 2 - No results found for: HGBA1C ****on insulin, PO meds only, diet controlled  ***Hypothyroidism: No results found for: TSH on synthroid  *** Morbid obesity-   BMI Readings from Last 1 Encounters:  02/16/21 34.46 kg/m     ***  Asthma -well *** controlled on home inhalers/ nebs f                        ***last no prior***admission  ***                       No ***history of intubation  *** COPD - not **followed by pulmonology *** not  on baseline oxygen  *L,    *** OSA -on nocturnal oxygen, *CPAP, *noncompliant with CPAP  *** Hx of CVA - *with/out residual deficits on Aspirin 81 mg, 325, Plavix  ***A. Fib -  - CHA2DS2 vas score **** CHA2DS2/VAS Stroke Risk Points      N/A >= 2 Points: High Risk  1 - 1.99 Points: Medium Risk  0 Points: Low Risk    Last Change: N/A      This score determines the patient's risk of having a stroke if the  patient has atrial fibrillation.      This score is not applicable to this patient. Components are not  calculated.     current  on anticoagulation with ****Coumadin  ***Xarelto,* Eliquis,  *** Not on anticoagulation secondary  to Risk of Falls, *** recurrent bleeding         -  Rate control:  Currently controlled with ***Toprolol,  *Metoprolol,* Diltiazem, *Coreg          - Rhythm control: *** amiodarone, *flecainide  ***Hx of DVT/PE on - anticoagulation with ****Coumadin  ***Xarelto,* Eliquis,   ***CKD stage III*- baseline Cr **** Estimated Creatinine Clearance: 82.7 mL/min (by C-G formula based on SCr of 1.11 mg/dL).  Lab Results  Component Value Date   CREATININE 1.11 02/16/2021   CREATININE 0.72 08/19/2020   CREATININE 0.70 11/09/2016     **** Liver disease Computed MELD-Na score unavailable. Necessary lab results were not found in the last year. Computed MELD score unavailable. Necessary lab results were not found in the last year.   ***BPH - on Flomax, Proscar    *** Dementia - on Aricept** Nemenda  *** Chronic anemia - baseline hg Hemoglobin & Hematocrit  Recent Labs    08/19/20 1818 08/24/20 1047 02/16/21 2105  HGB 7.4* 8.4* 8.7*     While in ER: initially tachycardiac up to 133 HG at 8.7 stable from last year BUN up to 28  Started on  octreotide, Protonix, rocephin    ED Triage Vitals  Enc Vitals Group     BP 02/16/21 2058 135/66     Pulse Rate 02/16/21 2058 (!) 133     Resp 02/16/21 2058 18     Temp 02/16/21 2058 98.3 F (36.8 C)     Temp Source 02/16/21 2058 Oral     SpO2 02/16/21 2058 100 %     Weight 02/16/21 2103 220 lb (99.8 kg)     Height 02/16/21 2103 5\' 7"  (1.702 m)     Head Circumference --      Peak Flow --      Pain Score 02/16/21 2059 0     Pain Loc --      Pain Edu? --      Excl. in [place]? --   TMAX(24)@     _________________________________________ Significant initial  Findings: Abnormal Labs Reviewed  CBC WITH DIFFERENTIAL/PLATELET - Abnormal; Notable for the following components:      Result Value   RBC 2.86 (*)    Hemoglobin 8.7 (*)    HCT 26.3 (*)    RDW 18.2 (*)    Platelets 148 (*)    Monocytes Absolute 1.2 (*)    All other components within normal limits  BASIC METABOLIC PANEL - Abnormal; Notable for the following components:   CO2 19 (*)    Glucose, Bld 274 (*)    BUN 28 (*)    Calcium 8.1 (*)    All other components within normal limits  PROTIME-INR - Abnormal; Notable for the following components:   Prothrombin Time 16.6 (*)    INR 1.4 (*)    All other components within normal limits   ____________________________________________ Ordered CT HEAD *** NON acute  CXR - ***NON acute  CTabd/pelvis - ***nonacute  CTA chest - ***nonacute, no PE, * no evidence of infiltrate   _________________________   ECG: Ordered Personally reviewed by me showing: HR : 135  Rhythm Sinus tachycardia   no evidence of ischemic changes QTC 482    The recent clinical data is shown below. Vitals:   02/16/21 2100 02/16/21 2103 02/16/21 2223 02/16/21 2239  BP: 131/78  120/60 (!) 120/52  Pulse: (!) 134  (!) 135 (!) 124  Resp: (!) 25  20   Temp:  TempSrc:      SpO2: 100%  100%   Weight:  99.8 kg    Height:  5\' 7"  (1.702 m)       WBC     Component Value Date/Time   WBC 10.0  02/16/2021 2105   LYMPHSABS 1.4 02/16/2021 2105   MONOABS 1.2 (H) 02/16/2021 2105   EOSABS 0.0 02/16/2021 2105   BASOSABS 0.1 02/16/2021 2105        Lactic Acid, Venous No results found for: LATICACIDVEN   Procalcitonin *** Ordered Lactic Acid, Venous No results found for: LATICACIDVEN   Procalcitonin *** Ordered   UA *** no evidence of UTI  ***Pending ***not ordered   Urine analysis:    Component Value Date/Time   COLORURINE YELLOW (A) 11/09/2016 1849   APPEARANCEUR CLEAR (A) 11/09/2016 1849   LABSPEC 1.018 11/09/2016 1849   PHURINE 6.0 11/09/2016 1849   GLUCOSEU NEGATIVE 11/09/2016 1849   HGBUR NEGATIVE 11/09/2016 1849   BILIRUBINUR NEGATIVE 11/09/2016 1849   KETONESUR NEGATIVE 11/09/2016 1849   PROTEINUR NEGATIVE 11/09/2016 1849   NITRITE NEGATIVE 11/09/2016 1849   LEUKOCYTESUR NEGATIVE 11/09/2016 1849    Results for orders placed or performed during the hospital encounter of 08/19/20  SARS Coronavirus 2 by RT PCR (hospital order, performed in [facility] hospital lab) Nasopharyngeal Nasopharyngeal Swab     Status: None   Collection Time: 08/19/20  6:18 PM   Specimen: Nasopharyngeal Swab  Result Value Ref Range Status   SARS Coronavirus 2 NEGATIVE NEGATIVE Final    Comment: (NOTE) SARS-CoV-2 target nucleic acids are NOT DETECTED.  The SARS-CoV-2 RNA is generally detectable in upper and lower respiratory specimens during the acute phase of infection. The lowest concentration of SARS-CoV-2 viral copies this assay can detect is 250 copies / mL. A negative result does not preclude SARS-CoV-2 infection and should not be used as the sole basis for treatment or other patient management decisions.  A negative result may occur with improper specimen collection / handling, submission of specimen other than nasopharyngeal swab, presence of viral mutation(s) within the areas targeted by this assay, and inadequate number of viral copies (<250 copies / mL). A negative  result must be combined with clinical observations, patient history, and epidemiological information.  Fact Sheet for Patients:   StrictlyIdeas.no  Fact Sheet for Healthcare Providers: BankingDealers.co.za  This test is not yet approved or  cleared by the [place] FDA and has been authorized for detection and/or diagnosis of SARS-CoV-2 by FDA under an Emergency Use Authorization (EUA).  This EUA will remain in effect (meaning this test can be used) for the duration of the COVID-19 declaration under Section 564(b)(1) of the Act, 21 U.S.C. section 360bbb-3(b)(1), unless the authorization is terminated or revoked sooner.  Performed at [facility], [address], [place], [place] [address]      _______________________________________________________ ER Provider Called:     DrMarland Kitchen  They Recommend admit to medicine *** Will see in AM  ***SEEN in ER _______________________________________________ Hospitalist was called for admission for ***  The following Work up has been ordered so far:  Orders Placed This Encounter  Procedures  . Resp Panel by RT-PCR (Flu A&B, Covid) Nasopharyngeal Swab  . CBC with Differential  . Basic metabolic panel  . Protime-INR  . APTT  . Consult to hospitalist  . Airborne and Contact precautions  . EKG 12-Lead  . Type and screen [place]  . Type and screen     Following  Medications were ordered in ER: Medications  pantoprazole (PROTONIX) 80 mg in sodium chloride 0.9 % 100 mL (0.8 mg/mL) infusion (8 mg/hr Intravenous New Bag/Given 02/16/21 2250)  pantoprazole (PROTONIX) injection 40 mg (has no administration in time range)  octreotide (SANDOSTATIN) 2 mcg/mL load via infusion 50 mcg (has no administration in time range)    And  octreotide (SANDOSTATIN) 500 mcg in sodium chloride 0.9 % 250 mL (2 mcg/mL) infusion (has no administration in time range)  cefTRIAXone  (ROCEPHIN) 1 g in sodium chloride 0.9 % 100 mL IVPB (1 g Intravenous New Bag/Given 02/16/21 2247)  pantoprazole (PROTONIX) 80 mg in sodium chloride 0.9 % 100 mL IVPB (0 mg Intravenous Stopped 02/16/21 2251)  sodium chloride 0.9 % bolus 1,000 mL (1,000 mLs Intravenous New Bag/Given 02/16/21 2241)        Consult Orders  (From admission, onward)         Start     Ordered   02/16/21 2243  Consult to hospitalist  Once       Provider:  (Not yet assigned)  Question Answer Comment  Place call to: hospitalist   Reason for Consult Consult   Diagnosis/Clinical Info for Consult: GI bleed      02/16/21 2242            OTHER Significant initial  Findings:  labs showing:    Recent Labs  Lab 02/16/21 2105  NA 137  K 4.4  CO2 19*  GLUCOSE 274*  BUN 28*  CREATININE 1.11  CALCIUM 8.1*   Cr   Up from baseline see below Lab Results  Component Value Date   CREATININE 1.11 02/16/2021   CREATININE 0.72 08/19/2020   CREATININE 0.70 11/09/2016    No results for input(s): AST, ALT, ALKPHOS, BILITOT, PROT, ALBUMIN in the last 168 hours. Lab Results  Component Value Date   CALCIUM 8.1 (L) 02/16/2021      Plt: Lab Results  Component Value Date   PLT 148 (L) 02/16/2021      Recent Labs  Lab 02/16/21 2105  WBC 10.0  NEUTROABS 7.3  HGB 8.7*  HCT 26.3*  MCV 92.0  PLT 148*    HG/HCT  stable,       Component Value Date/Time   HGB 8.7 (L) 02/16/2021 2105   HCT 26.3 (L) 02/16/2021 2105   MCV 92.0 02/16/2021 2105      No results for input(s): LIPASE, AMYLASE in the last 168 hours. No results for input(s): AMMONIA in the last 168 hours.   Cardiac Panel (last 3 results) No results for input(s): CKTOTAL, CKMB, TROPONINI, RELINDX in the last 72 hours.   BNP (last 3 results) Recent Labs    08/19/20 1729  BNP 83.0      DM  labs:  HbA1C: No results for input(s): HGBA1C in the last 8760 hours.     CBG (last 3)  No results for input(s): GLUCAP in the last 72 hours.         Cultures: No results found for: [place], [place], [place], REPTSTATUS   Radiological Exams on Admission: No results found. _______________________________________________________________________________________________________ Latest  Blood pressure (!) 120/52, pulse (!) 124, temperature 98.3 F (36.8 C), temperature source Oral, resp. rate 20, height 5\' 7"  (1.702 m), weight 99.8 kg, SpO2 100 %.   Review of Systems:    Pertinent positives include: ***  Constitutional:  No weight loss, night sweats, Fevers, chills, fatigue, weight loss  HEENT:  No headaches, Difficulty swallowing,Tooth/dental problems,Sore throat,  No  sneezing, itching, ear ache, nasal congestion, post nasal drip,  Cardio-vascular:  No chest pain, Orthopnea, PND, anasarca, dizziness, palpitations.no Bilateral lower extremity swelling  GI:  No heartburn, indigestion, abdominal pain, nausea, vomiting, diarrhea, change in bowel habits, loss of appetite, melena, blood in stool, hematemesis Resp:  no shortness of breath at rest. No dyspnea on exertion, No excess mucus, no productive cough, No non-productive cough, No coughing up of blood.No change in color of mucus.No wheezing. Skin:  no rash or lesions. No jaundice GU:  no dysuria, change in color of urine, no urgency or frequency. No straining to urinate.  No flank pain.  Musculoskeletal:  No joint pain or no joint swelling. No decreased range of motion. No back pain.  Psych:  No change in mood or affect. No depression or anxiety. No memory loss.  Neuro: no localizing neurological complaints, no tingling, no weakness, no double vision, no gait abnormality, no slurred speech, no confusion  All systems reviewed and apart from [place] all are negative _______________________________________________________________________________________________ Past Medical History:   Past Medical History:  Diagnosis Date  . IDA (iron deficiency anemia) 08/24/2020       History reviewed. No pertinent surgical history.  Social History:  Ambulatory *** independently cane, walker  wheelchair bound, bed bound     reports that he has never smoked. He has never used smokeless tobacco. He reports previous alcohol use. He reports that he does not use drugs.     Family History: *** Family History  Problem Relation Age of Onset  . Lung cancer Mother   . Cancer Father   . Heart attack Brother    ______________________________________________________________________________________________ Allergies: No Known Allergies   Prior to Admission medications   Medication Sig Start Date End Date Taking? Authorizing Provider  ferrous sulfate 325 (65 FE) MG tablet Take 1 tablet (325 mg total) by mouth 2 (two) times daily with a meal. 08/19/20 12/17/20  Harvest Dark, MD  vitamin B-12 (CYANOCOBALAMIN) 1000 MCG tablet Take 1 tablet (1,000 mcg total) by mouth daily. 08/24/20   Earlie Server, MD    ___________________________________________________________________________________________________ Physical Exam: Vitals with BMI 02/16/2021 02/16/2021 02/16/2021  Height - - 5\' 7"   Weight - - 220 lbs  BMI - - 34.19  Systolic 622 297 -  Diastolic 52 60 -  Pulse 989 135 -     1. General:  in No ***Acute distress***increased work of breathing ***complaining of severe pain****agitated * Chronically ill *well *cachectic *toxic acutely ill -appearing 2. Psychological: Alert and *** Oriented 3. Head/ENT:   Moist *** Dry Mucous Membranes                          Head Non traumatic, neck supple                          Normal *** Poor Dentition 4. SKIN: normal *** decreased Skin turgor,  Skin clean Dry and intact no rash 5. Heart: Regular rate and rhythm no*** Murmur, no Rub or gallop 6. Lungs: ***Clear to auscultation bilaterally, no wheezes or crackles   7. Abdomen: Soft, ***non-tender, Non distended *** obese ***bowel sounds present 8. Lower extremities: no clubbing,  cyanosis, no ***edema 9. Neurologically Grossly intact, moving all 4 extremities equally *** strength 5 out of 5 in all 4 extremities cranial nerves II through XII intact 10. MSK: Normal range of motion    Chart has been reviewed  ______________________________________________________________________________________________  Assessment/Plan  ***  Admitted for ***  Present on Admission: **None**   Other plan as per orders.  DVT prophylaxis:  SCD *** Lovenox       Code Status:    Code Status: Not on file FULL CODE *** DNR/DNI ***comfort care as per patient ***family  I had personally discussed CODE STATUS with patient and family* I had spent *min discussing goals of care and CODE STATUS    Family Communication:   Family not at  Bedside  plan of care was discussed on the phone with *** Son, Daughter, Wife, Husband, Sister, Brother , father, mother  Disposition Plan:   *** likely will need placement for rehabilitation                          Back to current facility when stable                            To home once workup is complete and patient is stable  ***Following barriers for discharge:                            Electrolytes corrected                               Anemia corrected                             Pain controlled with PO medications                               Afebrile, white count improving able to transition to PO antibiotics                             Will need to be able to tolerate PO                            Will likely need home health, home O2, set up                           Will need consultants to evaluate patient prior to discharge  ****EXPECT DC tomorrow                    ***Would benefit from PT/OT eval prior to DC  Ordered                   Swallow eval - SLP ordered                   Diabetes care coordinator                   Transition of care consulted                   Nutrition    consulted                  Wound care   consulted                   Palliative care    consulted  Behavioral health  consulted                    Consults called: ***    Admission status:  ED Disposition    None       Obs***  ***  inpatient     I Expect 2 midnight stay secondary to severity of patient's current illness need for inpatient interventions justified by the following: ***hemodynamic instability despite optimal treatment (tachycardia *hypotension * tachypnea *hypoxia, hypercapnia) * Severe lab/radiological/exam abnormalities including:     and extensive comorbidities including: *substance abuse  *Chronic pain *DM2  * CHF * CAD  * COPD/asthma *Morbid Obesity * CKD *dementia *liver disease *history of stroke with residual deficits *  malignancy, * sickle cell disease  . History of amputation . Chronic anticoagulation  That are currently affecting medical management.   I expect  patient to be hospitalized for 2 midnights requiring inpatient medical care.  Patient is at high risk for adverse outcome (such as loss of life or disability) if not treated.  Indication for inpatient stay as follows:  Severe change from baseline regarding mental status Hemodynamic instability despite maximal medical therapy,  ongoing suicidal ideations,  severe pain requiring acute inpatient management,  inability to maintain oral hydration   persistent chest pain despite medical management Need for operative/procedural  intervention New or worsening hypoxia  Need for IV antibiotics, IV fluids, IV rate controling medications, IV antihypertensives, IV pain medications, IV anticoagulation    Level of care   *** tele  For 12H 24H     medical floor       SDU tele indefinitely please discontinue once patient no longer qualifies COVID-19 Labs    Lab Results  Component Value Date   [place] NEGATIVE 08/19/2020     Precautions: admitted as *** Covid Negative  ***asymptomatic screening  protocol****PUI *** covid positive Airborne and Contact precautions ***If Covid PCR is negative  - please DC precautions - would need additional investigation given very high risk for false native test result   PPE: Used by the provider:   N95***P100  eye Goggles,  Gloves ***gown     Critical***  Patient is critically ill due to  hemodynamic instability * respiratory failure *severe sepsis* ongoing chest pain*  They are at high risk for life/limb threatening clinical deterioration requiring frequent reassessment and modifications of care.  Services provided include examination of the patient, review of relevant ancillary tests, prescription of lifesaving therapies, review of medications and prophylactic therapy.  Total critical care time excluding separately billable procedures: 60*  Minutes.    Anastassia Doutova 02/16/2021, 11:10 PM ***  [facility]     after 2 AM please page floor coverage PA If 7AM-7PM, please contact the day team taking care of the patient using [email or web address]   Patient was evaluated in the context of the global COVID-19 pandemic, which necessitated consideration that the patient might be at risk for infection with the SARS-CoV-2 virus that causes COVID-19. Institutional protocols and algorithms that pertain to the evaluation of patients at risk for COVID-19 are in a state of rapid change based on information released by regulatory bodies including the CDC and federal and state organizations. These policies and algorithms were followed during the patient's care.

## 2021-02-16 NOTE — ED Provider Notes (Signed)
[facility] Emergency Department Provider Note   ____________________________________________   I have reviewed the triage vital signs and the nursing notes.   HISTORY  Chief Complaint Rectal Bleeding   History limited by: Not Limited   HPI Terrence Martin is a 55 y.o. male who presents to the emergency department today because of concerns for GI bleeding.  The patient states that symptoms started this morning.  He started having both nausea vomiting as well as diarrhea.  He states that he noticed blood in both.  His vomiting which consisted of some bright red and dark red blood.  His stool had dark red as well as some black and tarry stool.  Patient denies any associated abdominal pain.  Did have some weakness later this afternoon.  Patient had similar symptoms once in the past was seen at outside hospital.  Patient states that he did not undergo EGD at that time given that he refused.  States that they treated him for ulcer.  He is a heavy alcohol user and drank 6 beers as well as 2 wine coolers last night.  States that he typically drinks daily.  No alcohol today.   Records reviewed. Per medical record review patient has a history of anemia, GI bleed.   Past Medical History:  Diagnosis Date  . IDA (iron deficiency anemia) 08/24/2020    Patient Active Problem List   Diagnosis Date Noted  . IDA (iron deficiency anemia) 08/24/2020  . Alcohol use 08/24/2020  . Transaminitis 08/24/2020  . GIB (gastrointestinal bleeding) 06/08/2020    No past surgical history on file.  Prior to Admission medications   Medication Sig Start Date End Date Taking? Authorizing Provider  ferrous sulfate 325 (65 FE) MG tablet Take 1 tablet (325 mg total) by mouth 2 (two) times daily with a meal. 08/19/20 12/17/20  Harvest Dark, MD  vitamin B-12 (CYANOCOBALAMIN) 1000 MCG tablet Take 1 tablet (1,000 mcg total) by mouth daily. 08/24/20   Earlie Server, MD    Allergies Patient has no  known allergies.  Family History  Problem Relation Age of Onset  . Lung cancer Mother   . Cancer Father   . Heart attack Brother     Social History Social History   Tobacco Use  . Smoking status: Never Smoker  . Smokeless tobacco: Never Used  Vaping Use  . Vaping Use: Never used  Substance Use Topics  . Alcohol use: Not Currently  . Drug use: Never    Review of Systems Constitutional: No fever/chills Eyes: No visual changes. ENT: No sore throat. Cardiovascular: Denies chest pain. Respiratory: Denies shortness of breath. Gastrointestinal: Positive for nausea, vomiting, diarrhea. Positive for bloody emesis and stool.  Genitourinary: Negative for dysuria. Musculoskeletal: Negative for back pain. Skin: Negative for rash. Neurological: Negative for headaches, focal weakness or numbness.  ____________________________________________   PHYSICAL EXAM:  VITAL SIGNS: ED Triage Vitals  Enc Vitals Group     BP 02/16/21 2058 135/66     Pulse Rate 02/16/21 2058 (!) 133     Resp 02/16/21 2058 18     Temp 02/16/21 2058 98.3 F (36.8 C)     Temp Source 02/16/21 2058 Oral     SpO2 02/16/21 2058 100 %     Weight --      Height --      Head Circumference --      Peak Flow --      Pain Score 02/16/21 2059 0   Constitutional: Alert and  oriented.  Eyes: Conjunctivae are normal.  ENT      Head: Normocephalic and atraumatic.      Nose: No congestion/rhinnorhea.      Mouth/Throat: Mucous membranes are moist.      Neck: No stridor. Hematological/Lymphatic/Immunilogical: No cervical lymphadenopathy. Cardiovascular: Tachycardic, regular rhythm.  No murmurs, rubs, or gallops.  Respiratory: Normal respiratory effort without tachypnea nor retractions. Breath sounds are clear and equal bilaterally. No wheezes/rales/rhonchi. Gastrointestinal: Soft and non tender. No rebound. No guarding.  Genitourinary: Deferred Musculoskeletal: Normal range of motion in all extremities. No lower  extremity edema. Neurologic:  Normal speech and language. Slight tremors.  Skin:  Skin is warm, dry and intact. No rash noted. Psychiatric: Mood and affect are normal. Speech and behavior are normal. Patient exhibits appropriate insight and judgment.  ____________________________________________    LABS (pertinent positives/negatives)  BMP na 137, k 4.4, glu 274, cr 1.11 CBC wbc 10.0, hgb 8.7, plt 148  ____________________________________________   EKG  I, Nance Pear, attending physician, personally viewed and interpreted this EKG  EKG Time: 2100 Rate: 135 Rhythm: sinus tachycardia Axis: normal Intervals: qtc 482 QRS: narrow ST changes: no st elevation Impression: abnormal ekg ____________________________________________    RADIOLOGY  None  ____________________________________________   PROCEDURES  Procedures  ____________________________________________   INITIAL IMPRESSION / ASSESSMENT AND PLAN / ED COURSE  Pertinent labs & imaging results that were available during my care of the patient were reviewed by me and considered in my medical decision making (see chart for details).   Patient presented to the emergency department today because of concerns for GI bleed.  Patient has history of similar.  While I do think ulcer likely given alcohol use per chart review it appears patient suffers from cirrhosis as well.  While I think variceal bleed is much less likely will go ahead and treat with antibiotics and octreotide as well as Protonix.  Patient's hemoglobin today is roughly what appears to be patient's baseline.  At this time do not feel transfusion is warranted.  I do wonder if part of his tachycardia is related to alcohol withdrawal.  Will plan on admission.  Discussed this with patient.  ____________________________________________   FINAL CLINICAL IMPRESSION(S) / ED DIAGNOSES  Final diagnoses:  Gastrointestinal hemorrhage, unspecified  gastrointestinal hemorrhage type     Note: This dictation was prepared with Dragon dictation. Any transcriptional errors that result from this process are unintentional     Nance Pear, MD 02/16/21 2348

## 2021-02-16 NOTE — ED Notes (Addendum)
Pt presenting via EMS from home with reports of dark stools and vomiting with BRB. Pt reports hx of same last July but never found cause. Pt denies abd pain but endorses nausea. AAOx4, tachycardiac and tachypneic. Normotensive. Placed in hospital gown, shirt placed in belongings bag

## 2021-02-17 ENCOUNTER — Encounter
Admission: EM | Disposition: A | Discharge status: Home / Self Care | Payer: Self-pay | Source: Home / Self Care | Attending: Internal Medicine

## 2021-02-17 ENCOUNTER — Inpatient Hospital Stay (HOSPITAL_COMMUNITY)
Admit: 2021-02-17 | Discharge: 2021-02-17 | Disposition: A | Discharge status: Home / Self Care | Payer: BC Managed Care – PPO | Attending: Cardiology | Admitting: Cardiology

## 2021-02-17 ENCOUNTER — Inpatient Hospital Stay: Payer: BC Managed Care – PPO | Admitting: Anesthesiology

## 2021-02-17 ENCOUNTER — Inpatient Hospital Stay: Admit: 2021-02-17 | Payer: BC Managed Care – PPO

## 2021-02-17 ENCOUNTER — Encounter: Payer: Self-pay | Admitting: Internal Medicine

## 2021-02-17 DIAGNOSIS — R7989 Other specified abnormal findings of blood chemistry: Secondary | ICD-10-CM

## 2021-02-17 DIAGNOSIS — I361 Nonrheumatic tricuspid (valve) insufficiency: Secondary | ICD-10-CM | POA: Diagnosis not present

## 2021-02-17 DIAGNOSIS — Z01818 Encounter for other preprocedural examination: Secondary | ICD-10-CM

## 2021-02-17 DIAGNOSIS — R571 Hypovolemic shock: Secondary | ICD-10-CM | POA: Diagnosis present

## 2021-02-17 DIAGNOSIS — R778 Other specified abnormalities of plasma proteins: Secondary | ICD-10-CM

## 2021-02-17 DIAGNOSIS — I8510 Secondary esophageal varices without bleeding: Secondary | ICD-10-CM

## 2021-02-17 DIAGNOSIS — D62 Acute posthemorrhagic anemia: Secondary | ICD-10-CM

## 2021-02-17 DIAGNOSIS — F1010 Alcohol abuse, uncomplicated: Secondary | ICD-10-CM | POA: Diagnosis present

## 2021-02-17 DIAGNOSIS — I8511 Secondary esophageal varices with bleeding: Secondary | ICD-10-CM

## 2021-02-17 DIAGNOSIS — R739 Hyperglycemia, unspecified: Secondary | ICD-10-CM | POA: Diagnosis present

## 2021-02-17 DIAGNOSIS — K922 Gastrointestinal hemorrhage, unspecified: Secondary | ICD-10-CM

## 2021-02-17 DIAGNOSIS — K7030 Alcoholic cirrhosis of liver without ascites: Secondary | ICD-10-CM

## 2021-02-17 HISTORY — PX: ESOPHAGOGASTRODUODENOSCOPY (EGD) WITH PROPOFOL: SHX5813

## 2021-02-17 HISTORY — DX: Acute posthemorrhagic anemia: D62

## 2021-02-17 HISTORY — DX: Hypovolemic shock: R57.1

## 2021-02-17 LAB — CBC WITH DIFFERENTIAL/PLATELET
Abs Immature Granulocytes: 0.1 10*3/uL — ABNORMAL HIGH (ref 0.00–0.07)
Abs Immature Granulocytes: 0.04 10*3/uL (ref 0.00–0.07)
Basophils Absolute: 0.1 10*3/uL (ref 0.0–0.1)
Basophils Absolute: 0.1 10*3/uL (ref 0.0–0.1)
Basophils Relative: 1 %
Basophils Relative: 1 %
Eosinophils Absolute: 0.1 10*3/uL (ref 0.0–0.5)
Eosinophils Absolute: 0.1 10*3/uL (ref 0.0–0.5)
Eosinophils Relative: 0 %
Eosinophils Relative: 1 %
HCT: 20.1 % — ABNORMAL LOW (ref 39.0–52.0)
HCT: 23.1 % — ABNORMAL LOW (ref 39.0–52.0)
Hemoglobin: 6.8 g/dL — ABNORMAL LOW (ref 13.0–17.0)
Hemoglobin: 8.1 g/dL — ABNORMAL LOW (ref 13.0–17.0)
Immature Granulocytes: 1 %
Immature Granulocytes: 0 %
Lymphocytes Relative: 23 %
Lymphocytes Relative: 17 %
Lymphs Abs: 2.7 10*3/uL (ref 0.7–4.0)
Lymphs Abs: 1.7 10*3/uL (ref 0.7–4.0)
MCH: 31.5 pg (ref 26.0–34.0)
MCH: 31.3 pg (ref 26.0–34.0)
MCHC: 33.8 g/dL (ref 30.0–36.0)
MCHC: 35.1 g/dL (ref 30.0–36.0)
MCV: 93.1 fL (ref 80.0–100.0)
MCV: 89.2 fL (ref 80.0–100.0)
Monocytes Absolute: 1.4 10*3/uL — ABNORMAL HIGH (ref 0.1–1.0)
Monocytes Absolute: 1.3 10*3/uL — ABNORMAL HIGH (ref 0.1–1.0)
Monocytes Relative: 12 %
Monocytes Relative: 13 %
Neutro Abs: 7.6 10*3/uL (ref 1.7–7.7)
Neutro Abs: 6.9 10*3/uL (ref 1.7–7.7)
Neutrophils Relative %: 63 %
Neutrophils Relative %: 68 %
Platelets: 124 10*3/uL — ABNORMAL LOW (ref 150–400)
Platelets: 75 10*3/uL — ABNORMAL LOW (ref 150–400)
RBC: 2.16 MIL/uL — ABNORMAL LOW (ref 4.22–5.81)
RBC: 2.59 MIL/uL — ABNORMAL LOW (ref 4.22–5.81)
RDW: 17.1 % — ABNORMAL HIGH (ref 11.5–15.5)
RDW: 15.9 % — ABNORMAL HIGH (ref 11.5–15.5)
WBC: 11.9 10*3/uL — ABNORMAL HIGH (ref 4.0–10.5)
WBC: 10 10*3/uL (ref 4.0–10.5)
nRBC: 0.3 % — ABNORMAL HIGH (ref 0.0–0.2)
nRBC: 0.2 % (ref 0.0–0.2)

## 2021-02-17 LAB — HEPATITIS PANEL, ACUTE
HCV Ab: NONREACTIVE
Hep A IgM: NONREACTIVE
Hep B C IgM: NONREACTIVE
Hepatitis B Surface Ag: NONREACTIVE

## 2021-02-17 LAB — COMPREHENSIVE METABOLIC PANEL
ALT: 26 U/L (ref 0–44)
AST: 62 U/L — ABNORMAL HIGH (ref 15–41)
Albumin: 1.8 g/dL — ABNORMAL LOW (ref 3.5–5.0)
Alkaline Phosphatase: 54 U/L (ref 38–126)
Anion gap: 10 (ref 5–15)
BUN: 32 mg/dL — ABNORMAL HIGH (ref 6–20)
CO2: 17 mmol/L — ABNORMAL LOW (ref 22–32)
Calcium: 7.3 mg/dL — ABNORMAL LOW (ref 8.9–10.3)
Chloride: 113 mmol/L — ABNORMAL HIGH (ref 98–111)
Creatinine, Ser: 1.58 mg/dL — ABNORMAL HIGH (ref 0.61–1.24)
GFR, Estimated: 51 mL/min — ABNORMAL LOW (ref >60)
Glucose, Bld: 174 mg/dL — ABNORMAL HIGH (ref 70–99)
Potassium: 4.7 mmol/L (ref 3.5–5.1)
Sodium: 140 mmol/L (ref 135–145)
Total Bilirubin: 2.7 mg/dL — ABNORMAL HIGH (ref 0.3–1.2)
Total Protein: 4.5 g/dL — ABNORMAL LOW (ref 6.5–8.1)

## 2021-02-17 LAB — CK: Total CK: 365 U/L (ref 49–397)

## 2021-02-17 LAB — CBC
HCT: 19.9 % — ABNORMAL LOW (ref 39.0–52.0)
HCT: 24.6 % — ABNORMAL LOW (ref 39.0–52.0)
HCT: 24.6 % — ABNORMAL LOW (ref 39.0–52.0)
Hemoglobin: 6.6 g/dL — ABNORMAL LOW (ref 13.0–17.0)
Hemoglobin: 8.5 g/dL — ABNORMAL LOW (ref 13.0–17.0)
Hemoglobin: 8.4 g/dL — ABNORMAL LOW (ref 13.0–17.0)
MCH: 31.4 pg (ref 26.0–34.0)
MCH: 30.9 pg (ref 26.0–34.0)
MCH: 31 pg (ref 26.0–34.0)
MCHC: 33.2 g/dL (ref 30.0–36.0)
MCHC: 34.6 g/dL (ref 30.0–36.0)
MCHC: 34.1 g/dL (ref 30.0–36.0)
MCV: 94.8 fL (ref 80.0–100.0)
MCV: 89.5 fL (ref 80.0–100.0)
MCV: 90.8 fL (ref 80.0–100.0)
Platelets: 132 10*3/uL — ABNORMAL LOW (ref 150–400)
Platelets: 81 10*3/uL — ABNORMAL LOW (ref 150–400)
Platelets: 67 10*3/uL — ABNORMAL LOW (ref 150–400)
RBC: 2.1 MIL/uL — ABNORMAL LOW (ref 4.22–5.81)
RBC: 2.75 MIL/uL — ABNORMAL LOW (ref 4.22–5.81)
RBC: 2.71 MIL/uL — ABNORMAL LOW (ref 4.22–5.81)
RDW: 18.3 % — ABNORMAL HIGH (ref 11.5–15.5)
RDW: 15.8 % — ABNORMAL HIGH (ref 11.5–15.5)
RDW: 16.2 % — ABNORMAL HIGH (ref 11.5–15.5)
WBC: 12.3 10*3/uL — ABNORMAL HIGH (ref 4.0–10.5)
WBC: 11.6 10*3/uL — ABNORMAL HIGH (ref 4.0–10.5)
WBC: 9.1 10*3/uL (ref 4.0–10.5)
nRBC: 0.2 % (ref 0.0–0.2)
nRBC: 0.2 % (ref 0.0–0.2)
nRBC: 0.2 % (ref 0.0–0.2)

## 2021-02-17 LAB — LACTIC ACID, PLASMA
Lactic Acid, Venous: 9.7 mmol/L (ref 0.5–1.9)
Lactic Acid, Venous: 7.4 mmol/L (ref 0.5–1.9)

## 2021-02-17 LAB — HIV ANTIBODY (ROUTINE TESTING W REFLEX): HIV Screen 4th Generation wRfx: NONREACTIVE

## 2021-02-17 LAB — TROPONIN I (HIGH SENSITIVITY)
Troponin I (High Sensitivity): 246 ng/L (ref <18)
Troponin I (High Sensitivity): 310 ng/L (ref <18)
Troponin I (High Sensitivity): 182 ng/L (ref <18)
Troponin I (High Sensitivity): 146 ng/L (ref <18)

## 2021-02-17 LAB — PREPARE RBC (CROSSMATCH)

## 2021-02-17 LAB — GLUCOSE, CAPILLARY
Glucose-Capillary: 227 mg/dL — ABNORMAL HIGH (ref 70–99)
Glucose-Capillary: 182 mg/dL — ABNORMAL HIGH (ref 70–99)
Glucose-Capillary: 157 mg/dL — ABNORMAL HIGH (ref 70–99)
Glucose-Capillary: 117 mg/dL — ABNORMAL HIGH (ref 70–99)
Glucose-Capillary: 143 mg/dL — ABNORMAL HIGH (ref 70–99)
Glucose-Capillary: 158 mg/dL — ABNORMAL HIGH (ref 70–99)

## 2021-02-17 LAB — MAGNESIUM
Magnesium: 1.6 mg/dL — ABNORMAL LOW (ref 1.7–2.4)
Magnesium: 1.9 mg/dL (ref 1.7–2.4)

## 2021-02-17 LAB — CBG MONITORING, ED: Glucose-Capillary: 184 mg/dL — ABNORMAL HIGH (ref 70–99)

## 2021-02-17 LAB — PHOSPHORUS
Phosphorus: 2.9 mg/dL (ref 2.5–4.6)
Phosphorus: 3.3 mg/dL (ref 2.5–4.6)

## 2021-02-17 LAB — MRSA PCR SCREENING: MRSA by PCR: NEGATIVE

## 2021-02-17 LAB — VITAMIN B12: Vitamin B-12: 311 pg/mL (ref 180–914)

## 2021-02-17 LAB — FERRITIN: Ferritin: 392 ng/mL — ABNORMAL HIGH (ref 24–336)

## 2021-02-17 LAB — LIPASE, BLOOD: Lipase: 31 U/L (ref 11–51)

## 2021-02-17 LAB — TSH: TSH: 0.87 u[IU]/mL (ref 0.350–4.500)

## 2021-02-17 LAB — ETHANOL: Alcohol, Ethyl (B): <10 mg/dL (ref <10)

## 2021-02-17 SURGERY — ESOPHAGOGASTRODUODENOSCOPY (EGD) WITH PROPOFOL
Anesthesia: General

## 2021-02-17 MED ORDER — CHLORHEXIDINE GLUCONATE CLOTH 2 % EX PADS
6.0000 | MEDICATED_PAD | Freq: Every day | CUTANEOUS | Status: DC
  Administered 2021-02-17 – 2021-02-18 (×2): 6 via TOPICAL

## 2021-02-17 MED ORDER — VASOPRESSIN 20 UNIT/ML IV SOLN
INTRAVENOUS | Status: DC | PRN
  Administered 2021-02-17 (×2): 1 [IU] via INTRAVENOUS

## 2021-02-17 MED ORDER — FENTANYL CITRATE (PF) 100 MCG/2ML IJ SOLN
INTRAMUSCULAR | Status: AC
  Filled 2021-02-17: qty 2

## 2021-02-17 MED ORDER — ACETAMINOPHEN 650 MG RE SUPP: 650.0000 mg | Freq: Four times a day (QID) | RECTAL | Status: DC | PRN

## 2021-02-17 MED ORDER — SODIUM CHLORIDE 0.9% IV SOLUTION: Freq: Once | INTRAVENOUS | Status: DC

## 2021-02-17 MED ORDER — DIAZEPAM 5 MG/ML IJ SOLN: 5.0000 mg | Freq: Four times a day (QID) | INTRAMUSCULAR | Status: DC

## 2021-02-17 MED ORDER — SODIUM CHLORIDE 0.9% IV SOLUTION
Freq: Once | INTRAVENOUS | Status: AC
  Filled 2021-02-17: qty 250

## 2021-02-17 MED ORDER — PROPOFOL 10 MG/ML IV BOLUS
INTRAVENOUS | Status: AC
  Filled 2021-02-17: qty 20

## 2021-02-17 MED ORDER — ONDANSETRON HCL 4 MG/2ML IJ SOLN
4.0000 mg | Freq: Four times a day (QID) | INTRAMUSCULAR | Status: DC | PRN
  Administered 2021-02-17 (×2): 4 mg via INTRAVENOUS

## 2021-02-17 MED ORDER — PROPOFOL 500 MG/50ML IV EMUL
INTRAVENOUS | Status: DC | PRN
  Administered 2021-02-17: 150 ug/kg/min via INTRAVENOUS

## 2021-02-17 MED ORDER — MAGNESIUM SULFATE 4 GM/100ML IV SOLN
4.0000 g | Freq: Once | INTRAVENOUS | Status: AC
  Administered 2021-02-17: 4 g via INTRAVENOUS
  Filled 2021-02-17: qty 100

## 2021-02-17 MED ORDER — SODIUM CHLORIDE 0.9 % IV SOLN
1.0000 g | INTRAVENOUS | Status: DC
  Administered 2021-02-17: 1 g via INTRAVENOUS
  Filled 2021-02-17: qty 1
  Filled 2021-02-17: qty 10

## 2021-02-17 MED ORDER — DEXAMETHASONE SODIUM PHOSPHATE 10 MG/ML IJ SOLN
INTRAMUSCULAR | Status: DC | PRN
  Administered 2021-02-17: 10 mg via INTRAVENOUS

## 2021-02-17 MED ORDER — SODIUM CHLORIDE 0.9 % IV SOLN: INTRAVENOUS | Status: DC

## 2021-02-17 MED ORDER — ACETAMINOPHEN 325 MG PO TABS: 650.0000 mg | ORAL_TABLET | Freq: Four times a day (QID) | ORAL | Status: DC | PRN

## 2021-02-17 MED ORDER — MIDAZOLAM HCL 5 MG/5ML IJ SOLN
INTRAMUSCULAR | Status: DC | PRN
  Administered 2021-02-17: 2 mg via INTRAVENOUS

## 2021-02-17 MED ORDER — MIDAZOLAM HCL 2 MG/2ML IJ SOLN
INTRAMUSCULAR | Status: AC
  Filled 2021-02-17: qty 2

## 2021-02-17 MED ORDER — ALBUMIN HUMAN 5 % IV SOLN
25.0000 g | Freq: Once | INTRAVENOUS | Status: AC
  Administered 2021-02-17: 25 g via INTRAVENOUS
  Filled 2021-02-17: qty 500

## 2021-02-17 MED ORDER — LIDOCAINE 2% (20 MG/ML) 5 ML SYRINGE
INTRAMUSCULAR | Status: DC | PRN
  Administered 2021-02-17: 100 mg via INTRAVENOUS

## 2021-02-17 MED ORDER — ONDANSETRON HCL 4 MG PO TABS: 4.0000 mg | ORAL_TABLET | Freq: Four times a day (QID) | ORAL | Status: DC | PRN

## 2021-02-17 MED ORDER — SODIUM CHLORIDE 0.9% IV SOLUTION: Freq: Once | INTRAVENOUS | Status: AC

## 2021-02-17 MED ORDER — PROPOFOL 10 MG/ML IV BOLUS
INTRAVENOUS | Status: DC | PRN
  Administered 2021-02-17: 100 mg via INTRAVENOUS

## 2021-02-17 MED ORDER — FENTANYL CITRATE (PF) 100 MCG/2ML IJ SOLN
INTRAMUSCULAR | Status: DC | PRN
  Administered 2021-02-17: 50 ug via INTRAVENOUS

## 2021-02-17 MED ORDER — DIAZEPAM 5 MG/ML IJ SOLN: 5.0000 mg | Freq: Four times a day (QID) | INTRAMUSCULAR | Status: DC | PRN

## 2021-02-17 MED ORDER — PHENYLEPHRINE HCL (PRESSORS) 10 MG/ML IV SOLN
INTRAVENOUS | Status: DC | PRN
  Administered 2021-02-17: 300 ug via INTRAVENOUS
  Administered 2021-02-17: 200 ug via INTRAVENOUS

## 2021-02-17 MED ORDER — HYDROCODONE-ACETAMINOPHEN 5-325 MG PO TABS: 1.0000 | ORAL_TABLET | ORAL | Status: DC | PRN

## 2021-02-17 MED ORDER — PERFLUTREN LIPID MICROSPHERE
1.0000 mL | INTRAVENOUS | Status: AC | PRN
  Administered 2021-02-17: 2 mL via INTRAVENOUS
  Filled 2021-02-17: qty 10

## 2021-02-17 MED ORDER — PHENYLEPHRINE HCL-NACL 10-0.9 MG/250ML-% IV SOLN
0.0000 ug/min | INTRAVENOUS | Status: DC
  Filled 2021-02-17: qty 250

## 2021-02-17 MED ORDER — DEXMEDETOMIDINE (PRECEDEX) IN NS 20 MCG/5ML (4 MCG/ML) IV SYRINGE
PREFILLED_SYRINGE | INTRAVENOUS | Status: DC | PRN
  Administered 2021-02-17: 8 ug via INTRAVENOUS

## 2021-02-17 NOTE — Progress Notes (Signed)
[place] [facility] [place] NAME: Terrence Martin    MR#:  [id number]  DATE OF BIRTH:  [birth year]  SUBJECTIVE:   Came in with vomiting blood and dark blood per rectum. Denies abdominal pain. Has history of chronic alcoholism. No family in the room.  Patient earlier bit hesitant to get EGD due to some complications with father also now agreeable. Denies chest pain REVIEW OF SYSTEMS:   Review of Systems  Constitutional: Negative for chills, fever and weight loss.  HENT: Negative for ear discharge, ear pain and nosebleeds.   Eyes: Negative for blurred vision, pain and discharge.  Respiratory: Negative for sputum production, shortness of breath, wheezing and stridor.   Cardiovascular: Negative for chest pain, palpitations, orthopnea and PND.  Gastrointestinal: Positive for blood in stool and vomiting. Negative for abdominal pain, diarrhea and nausea.  Genitourinary: Negative for frequency and urgency.  Musculoskeletal: Negative for back pain and joint pain.  Neurological: Negative for sensory change, speech change, focal weakness and weakness.  Psychiatric/Behavioral: Negative for depression and hallucinations. The patient is not nervous/anxious.    Tolerating Diet: Tolerating PT:   DRUG ALLERGIES:  No Known Allergies  VITALS:  Blood pressure 107/87, pulse (!) 104, temperature 97.9 F (36.6 C), temperature source Temporal, resp. rate 18, height 5\' 7"  (1.702 m), weight 99.8 kg, SpO2 100 %.  PHYSICAL EXAMINATION:   Physical Exam  GENERAL:  55 y.o.-year-old patient lying in the bed with no acute distress.  HEENT: Head atraumatic, normocephalic. Oropharynx and nasopharynx clear. Pallor+ LUNGS: Normal breath sounds bilaterally, no wheezing, rales, rhonchi. No use of accessory muscles of respiration.  CARDIOVASCULAR: S1, S2 normal. No murmurs, rubs, or gallops.  ABDOMEN: Soft, nontender, nondistended. Bowel sounds present. No organomegaly or mass.   EXTREMITIES: No cyanosis, clubbing or edema b/l.    NEUROLOGIC: Cranial nerves II through XII are intact. No focal Motor or sensory deficits b/l.   PSYCHIATRIC:  patient is alert and oriented x 3.  SKIN: No obvious rash, lesion, or ulcer.   LABORATORY PANEL:  CBC Recent Labs  Lab 02/17/21 1507  WBC 11.6*  HGB 8.5*  HCT 24.6*  PLT 81*    Chemistries  Recent Labs  Lab 02/17/21 0646  NA 140  K 4.7  CL 113*  CO2 17*  GLUCOSE 174*  BUN 32*  CREATININE 1.58*  CALCIUM 7.3*  MG 1.9  AST 62*  ALT 26  ALKPHOS 54  BILITOT 2.7*   Cardiac Enzymes No results for input(s): TROPONINI in the last 168 hours. RADIOLOGY:  No results found. ASSESSMENT AND PLAN:  Terrence Martin is a 55 y.o. male with medical history significant of PUD and anemia, alcohol abuse  Presented with   Black tarry stools since 7 AM and bright red blood in vomit x 3 large amount each with large amount of clots  EMS was called and gave 400 ml IV fluids Hx of GI bleed in [place] refused endoscopy at that time, was treated with blood transfusion and stabalized He continues to drink 3-4 beers per day and 12 pack on the weekends.  G.I. bleed/hemorrhagic shock/hypotension/lactic acidosis variceal bleed alcohol abuse/alcoholic liver disease -- continue aggressive IV fluids, pressor support if needed -- patient is status post for unit blood transfusion -- hemoglobin 6.1--- two unit blood transfusion-- 6.8--- two more units--- 8.5 -- patient underwent EGD that showed larger varices status post three bands -- IV Protonix -- IV Octreotide -- patient to remain NPO for now  Elevated troponin suspect demand ischemia with G.I. bleed and hypotension -- cardiology consultation placed with Freeman MG -- monitor troponin -- patient has no history of cardiac disease -- echo of the heart -- avoid antiplatelet due to G.I. bleed  Thrombocytopenia secondary to chronic alcoholism -- platelet count 124-- 81 K  Hyperglycemia --  continue sliding scale -- A1c pending     Procedures: EGD Family communication : Consults : G.I., ICU CODE STATUS: full DVT Prophylaxis : SCD Level of care: ICU Status is: Inpatient  Remains inpatient appropriate because:IV treatments appropriate due to intensity of illness or inability to take PO and Inpatient level of care appropriate due to severity of illness   Dispo: The patient is from: Home              Anticipated d/c is to: Home              Patient currently is not medically stable to d/c.   Difficult to place patient No  Patient admitted with G.I. bleed suspected from variceal bleed. Patient will need monitoring for hemoglobin for next couple days. Follow G.I. recommendation.      TOTAL TIME TAKING CARE OF THIS PATIENT: 35 minutes.  >50% time spent on counselling and coordination of care  Note: This dictation was prepared with Dragon dictation along with smaller phrase technology. Any transcriptional errors that result from this process are unintentional.  Fritzi Mandes M.D    [facility]   CC: Primary care physician; System, Provider Not InPatient ID: Terrence Martin, male   DOB: 1966-03-23, 55 y.o.   MRN: [id number]

## 2021-02-17 NOTE — ED Notes (Signed)
Report given Tess RN

## 2021-02-17 NOTE — Consult Note (Signed)
Cardiology Consult    Patient ID: Terrence Martin MRN: [id number], DOB/AGE: 57-12-1965   Admit date: 02/16/2021 Date of Consult: 02/17/2021  Primary Physician: System, Provider Not In Primary Cardiologist: Kate Sable, MD Requesting Provider: Dr. Fritzi Mandes  Patient Profile    Terrence Martin is a 55 y.o. male with a history of etoh abuse, PUD, recurrent GIB, and iron def anemia, who is being seen today for the evaluation of elevated HsTroponin at the request of Dr. Posey Pronto.  Past Medical History   Past Medical History:  Diagnosis Date  . GIB (gastrointestinal bleeding)   . IDA (iron deficiency anemia) 08/24/2020  . PUD (peptic ulcer disease)     History reviewed. No pertinent surgical history.   Allergies  No Known Allergies  History of Present Illness    Terrence Martin is a 55 y/o male with history of etoh abuse, PUD, recurrent GIB, and iron def anemia, who is being seen today for the evaluation of elevated HsTroponin at the request of Dr. Posey Pronto. The patient has a history of a GIB in [place], [place] in July 2021 that required 3 units of blood and iron infusions (records visible in Care Everywhere). Upper GI was recommended but the patient refused and was discharged on PPI. Since that time, he states he has been in a normal state of health until 3/22 when he had hematochezia x 1, followed by 2 episodes of hematemesis while at home. Upon arrival to the ED, he was found to have low hgb (8.7  6.6) and evaluated by IM w/discussion of need for GI consult and likely endoscopy. He is fearful of endoscopy because his father died during an endoscopy. He was agreeable to receiving blood products. While in the ED awaiting bed placement today, he had a large episode of hematemesis resulting in hypotension with BP 76/36. He received 1 L NS bolus and patient was transferred to ICU. We were asked to consult prior to his upper endoscopy due to the need for mechanical ventilation during the procedure. He is  currently being transported to the endoscopy suite. He reports that he drinks 12 beers daily. He works as a Freight forwarder at [employer] and reports his job is active. He does not exercise regularly but denies dyspnea or chest pain on exertion while working. He denies chest pain, dyspnea, or palpitations at present. He denies use of NSAIDs. His significant other is at the bedside and reports he took Tylenol x 1 yesterday.   Inpatient Medications    . [MAR Hold] sodium chloride   Intravenous Once  . [MAR Hold] sodium chloride   Intravenous Once  . [MAR Hold] Chlorhexidine Gluconate Cloth  6 each Topical Q0600  . [MAR Hold] diazepam  5 mg Intravenous Q6H  . [MAR Hold] insulin aspart  0-9 Units Subcutaneous Q4H  . [MAR Hold] pantoprazole  40 mg Intravenous Q12H  . [MAR Hold] thiamine  100 mg Oral Daily   Or  . [MAR Hold] thiamine  100 mg Intravenous Daily    Family History    Family History  Problem Relation Age of Onset  . Lung cancer Mother   . Cancer Father   . Heart attack Brother    He indicated that his mother is deceased. He indicated that his father is deceased. He indicated that his brother is deceased.  Social History    Social History   Socioeconomic History  . Marital status: Single    Spouse name: Not on file  . Number of children:  Not on file  . Years of education: Not on file  . Highest education level: Not on file  Occupational History  . Not on file  Tobacco Use  . Smoking status: Never Smoker  . Smokeless tobacco: Never Used  Vaping Use  . Vaping Use: Never used  Substance and Sexual Activity  . Alcohol use: Yes    Alcohol/week: 72.0 standard drinks    Types: 72 Cans of beer per week  . Drug use: Never  . Sexual activity: Not on file  Other Topics Concern  . Not on file  Social History Narrative   Lives locally.  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  Does not routinely exercise.   Social Determinants of Health   Financial Resource Strain: Not on file  Food  Insecurity: Not on file  Transportation Needs: Not on file  Physical Activity: Not on file  Stress: Not on file  Social Connections: Not on file  Intimate Partner Violence: Not on file     Review of Systems    General:  No chills, fever, night sweats or weight changes.  Cardiovascular:  No chest pain, dyspnea on exertion, edema, orthopnea, palpitations, paroxysmal nocturnal dyspnea. Dermatological: No rash, lesions/masses Respiratory: No cough, dyspnea Urologic: No hematuria, dysuria Abdominal:   +++ melena/hematemesis, +++ nausea, +++ vomiting, no diarrhea. Neurologic:  No visual changes, wkns, changes in mental status. All other systems reviewed and are otherwise negative except as noted above.  Physical Exam    Blood pressure 107/87, pulse (!) 104, temperature 97.9 F (36.6 C), temperature source Temporal, resp. rate 18, height 5\' 7"  (1.702 m), weight 99.8 kg, SpO2 100 %.  General: Pleasant, NAD Psych: Normal affect. Neuro: Alert and oriented X 3. Moves all extremities spontaneously. HEENT: Normal  Neck: Supple without bruits or JVD. Lungs:  Resp regular and unlabored, CTA. Heart: RRR no s3, s4, or murmurs. Abdomen: Soft, non-tender, non-distended, BS + x 4.  Extremities: No clubbing, cyanosis or edema. DP/PT2+, Radials 2+ and equal bilaterally.  Labs    Cardiac Enzymes Recent Labs  Lab 02/16/21 2105 02/17/21 0646 02/17/21 1507  TROPONINIHS 246* 310* 182*      Lab Results  Component Value Date   WBC 11.6 (H) 02/17/2021   HGB 8.5 (L) 02/17/2021   HCT 24.6 (L) 02/17/2021   MCV 89.5 02/17/2021   PLT 81 (L) 02/17/2021    Recent Labs  Lab 02/17/21 0646  NA 140  K 4.7  CL 113*  CO2 17*  BUN 32*  CREATININE 1.58*  CALCIUM 7.3*  PROT 4.5*  BILITOT 2.7*  ALKPHOS 54  ALT 26  AST 62*  GLUCOSE 174*    Radiology Studies    ---  ECG & Cardiac Imaging    ST @ 135 bpm - no acute changes from previous ecg 07/2020- personally reviewed.  Assessment & Plan     1.  Acute anemia/blood loss/GIB: Patient presented this admission with hematemesis and hematochezia. H/H dropped to 6.6/19.9 req multiple units of PRBCs.  We've been asked to assess cardiac risk in the setting of elev HsTrop.  Given urgent nature of EGD, we rec proceeding w/o further cardiac eval.  He has been taken to endoscopy and been found to have no active bleeding esophageal varices w/banding per GI. Avoid antiplatelets/anticoags.   2.  Elevated HsTroponin/Demand Ischemia: 246 310 182. Trending downward. Patient presents with acute GIB and is being taken to endoscopy lab for procedure requiring mechanical ventilation. He does not report DOE, chest pain with  activity, although he does not exercise regularly. 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 He denies past cardiac hx or family hx significant for premature CAD. Echo has been ordered. Will review echo and if nl, can consider ischemic evaluation as an outpatient after he has recovered from this hospitalization, though suspect troponin bump 2/2 supply:demand mismatch in the setting of ABL anemia/GIB. No role for anticoag/antiplatelet in the setting of above.   3. Hyperglycemia: Glucose has been elevated during this admission. He denies hx of DM. A1C has been ordered. Management per IM.   4. Sinus tachycardia: HR has been trending 100-130. ECG reviewed above. In the setting of his current volume deficit, we will continue to monitor and can initiate GDMT based upon echo findings and following his recovery from endoscopy and potential intervention.  5.  AKI:  Creat up since admission in the setting of anemia and episodic hypotension.  Follow.  6.  ETOH Abuse:  12 pack beer/day.  Will need ongoing inpt and outpt cessation counseling.  Consider SW consult.  Signed, Murray Hodgkins, NP 02/17/2021, 4:33 PM  For questions or updates, please contact   Please consult [email or web address] for contact info under Cardiology/STEMI.

## 2021-02-17 NOTE — Progress Notes (Signed)
Inpatient Diabetes Program Recommendations  AACE/ADA: New Consensus Statement on Inpatient Glycemic Control (2015)  Target Ranges:  Prepandial:   less than 140 mg/dL      Peak postprandial:   less than 180 mg/dL (1-2 hours)      Critically ill patients:  140 - 180 mg/dL   Lab Results  Component Value Date   GLUCAP 117 (H) 02/17/2021    Review of Glycemic Control Results for DAMARRION, MIMBS (MRN [id number]) as of 02/17/2021 12:20  Ref. Range 02/17/2021 00:11 02/17/2021 01:55 02/17/2021 04:52 02/17/2021 07:29 02/17/2021 11:13  Glucose-Capillary Latest Ref Range: 70 - 99 mg/dL 184 (H) 227 (H) 182 (H) 157 (H) 117 (H)   Diabetes history: None- A1C pending Outpatient Diabetes medications: None Current orders for Inpatient glycemic control:  Novolog sensitive q 4 hours  Inpatient Diabetes Program Recommendations:    Note glucose elevated on admit.  A1C pending, however with anemia and GI bleed, it will likely not be accurate.  Blood sugars have improved.  May need outpatient f/u to determine if this is new diagnosis or due to stress/hospitalization? Will follow- agree with current orders.   Thanks  Adah Perl, RN, BC-ADM Inpatient Diabetes Coordinator Pager [phone] (8a-5p)

## 2021-02-17 NOTE — Progress Notes (Signed)
Per ICU nurse taking care of patient, patient agreed to undergo upper endoscopy.  Patient received 2 units of PRBCs this afternoon and currently receiving 1 unit of FFP's.  He had 2 bloody bowel movements during this shift.  His vitals have been stable, did not require any pressors and he is mentating well.  His troponins are elevated during this admission, likely demand ischemia and currently plateaued.  In the setting of active GI bleed, upper endoscopy is deemed as urgent/semiemergent procedure.  Patient is evaluated by cardiology, further cardiac work-up in this acute setting is not indicated prior to endoscopic evaluation.  We will proceed with upper endoscopy as an emergent procedure  Cephas Darby, MD [address]  [place]  [place], [place] [address]  Main: [phone]  Fax: [phone] Pager: [phone]

## 2021-02-17 NOTE — Progress Notes (Signed)
eLink Physician-Brief Progress Note Patient Name: Per Beagley DOB: 02/21/1966 MRN: [id number]   Date of Service  02/17/2021  HPI/Events of Note  56 M ETOH abuse, presented with nausea, vomiting and diarrhea with note of black tarry stool with occasional dark red output. Tachycardic on presentation. Had bloody emesis and became hypotensive. Initial H/H 8.7/26.3 down to 6.6/19.9, PT 16.6, INR 1.4  eICU Interventions  Patient seen not in distress, ongoing transfusion. On octreotide and PPI drip.  Will need EGD. Reportedly had previously refused this procedure.     Intervention Category Major Interventions: Hemorrhage - evaluation and management Evaluation Type: New Patient Evaluation  Judd Lien 02/17/2021, 2:45 AM

## 2021-02-17 NOTE — Consult Note (Addendum)
NAME:  Terrence Martin, MRN:  [id number], DOB:  19-Aug-1966, LOS: 1 ADMISSION DATE:  02/16/2021, CONSULTATION DATE: 02/17/2021 REFERRING MD: Dr. Roel Cluck, CHIEF COMPLAINT: Hematemesis   History of Present Illness:  This is a 55 yo male with a hx of current ETOH abuse (drinks 2-3 beers after work 3-4 days a week, and on his days off he drinks a case of beer along with 3 wine coolers), Thrombocytopenia, and Iron Deficiency Anemia.  He presented to [facility] ER on 03/22 via EMS from home with complaints of multiple episodes of vomiting bright red blood and black tarry stools onset 07:00 am on 03/22.  Pt reports his last alcoholic beverage was on 38/25 due to inability to consume alcohol secondary to nausea. He had a similar presentation at an outside hospital 1.5 yrs ago, and recommendation at that time was upper endoscopy for further evaluation.  Pt refused upper endoscopy and received blood products along with iron infusions.  Upon reviewing records pt referred to hematology for management of iron deficiency anemia of unknown etiology, however his last appointment was 07/2020 during which a referral was made to gastroenterologist Dr. Vicente Males.  However, he did not follow-up with gastroenterology and he stopped following up with hematology. After speaking with pt he states he is fearful of proceeding with upper endoscopy because his father died 60 yrs ago during an upper endoscopy due to a portion of the scope breaking off during the procedure.    ED course Upon arrival to the ER pts vital signs were initially stable: temp 98.3 F orally, bp 135/66, hr 133, O2 sats 100% on RA.  Lab results revealed CO2 19, glucose 274, BUN 28, calcium 8.1, troponin 246, hgb 8.7, platelets 148, PT 16.6, INR 1.4, APTT 34, and Respiratory Panel by RT-PCR negative.  Pt initially admitted to the cardiac progressive care unit for additional workup and treatment.  On call Gastroenterology consulted by hospitalist and made the following  recommendations: protonix gtt, octreotide gtt, and ceftriaxone for SBP coverage.  While in the ER pending bed availability pt had a large episode of hematemesis resulting in hypotension bp 76/36.  Pt received 1L NS bolus.  Hospitalist contacted on call Gastroenterologist regarding change in pt condition.  GI recommended  transfer to ICU, and if pt agreeable to proceeding with upper endoscopy he will need mechanical intubation for procedure. PCCM team consulted to assist with management. Pt still unsure if he would like to proceed with upper endoscopy.    Pertinent  Medical History  Iron Deficiency Anemia  Current ETOH Abuse  Thrombocytopenia  Significant Hospital Events: Including procedures, antibiotic start and stop dates in addition to other pertinent events   03/22: Pt admitted to ICU with acute GI bleed 03/22: Ceftriaxone>>  Interim History / Subjective:  No complaints at this time   Objective   Blood pressure 107/63, pulse (!) 122, temperature 98.3 F (36.8 C), temperature source Oral, resp. rate (!) 21, height 5\' 7"  (1.702 m), weight 99.8 kg, SpO2 100 %.        Intake/Output Summary (Last 24 hours) at 02/17/2021 0120 Last data filed at 02/16/2021 2055 Gross per 24 hour  Intake 400 ml  Output -  Net 400 ml   Filed Weights   02/16/21 2103  Weight: 99.8 kg    Examination: General: acutely ill appearing male, resting in bed NAD  HENT: supple, no JVD Lungs: clear throughout, even, non labored  Cardiovascular: sinus tach, no R/G, 2+ radial/2+ distal pulses, no edema  Abdomen: +BS x4, obese, soft, non tender, non distended  Extremities: normal bulk and tone, moves all extremities  Neuro: alert and oriented, follows commands  GU: voiding utilizing urinal   Labs/imaging that I havepersonally reviewed   Reviewed all labs and imaging   Assessment & Plan:   Hemorrhagic shock secondary to acute GI bleed  Thrombocytopenia  Hx: ETOH Abuse, Upper GI bleed, and B12  Deficiency Gastroenterology consulted appreciate input-continue octreotide gtt, protonix gtt, and ceftriaxone for SBP coverage.  Recommending upper endoscopy, however pt continuing to refuse procedure despite reiteration of possible death without the procedure  Keep NPO  Serial H&H q6hrs Transfuse for hgb < 7 and/or active signs of bleeding Continue maintenance fluids  Prn zofran for nausea/vomiting   Elevated troponin likely demand ischemia in the setting of acute GI bleed Continuous telemetry monitoring  Trend troponin's  Echo and TSH pending   Severe lactic acidosis  Trend lactic acid   Hyperglycemia  CBG's q4hrs  SSI  Hemoglobin A1c pending   Current ETOH abuse  Acute hepatitis panel and hepatic function panel pending pending  Will order [place] RUQ limited  CIWA protocol  ETOH abuse cessation counseling provided    Best practice (evaluated daily)  Diet:  NPO Pain/Anxiety/Delirium protocol (if indicated): No VAP protocol (if indicated): Not indicated DVT prophylaxis: Contraindicated GI prophylaxis: PPI Glucose control:  SSI Yes Central venous access:  N/A Arterial line:  N/A Foley:  N/A Mobility:  bed rest  PT consulted: N/A Last date of multidisciplinary goals of care discussion [N/A] Code Status:  full code Disposition: ICU  Labs   CBC: Recent Labs  Lab 02/16/21 2105 02/17/21 0057  WBC 10.0 12.3*  NEUTROABS 7.3  --   HGB 8.7* 6.6*  HCT 26.3* 19.9*  MCV 92.0 94.8  PLT 148* 132*    Basic Metabolic Panel: Recent Labs  Lab 02/16/21 2105  NA 137  K 4.4  CL 107  CO2 19*  GLUCOSE 274*  BUN 28*  CREATININE 1.11  CALCIUM 8.1*  MG 1.6*  PHOS 2.9   GFR: Estimated Creatinine Clearance: 82.7 mL/min (by C-G formula based on SCr of 1.11 mg/dL). Recent Labs  Lab 02/16/21 2105 02/17/21 0057  WBC 10.0 12.3*    Liver Function Tests: No results for input(s): AST, ALT, ALKPHOS, BILITOT, PROT, ALBUMIN in the last 168 hours. Recent Labs  Lab  02/16/21 2105  LIPASE 31   No results for input(s): AMMONIA in the last 168 hours.  ABG No results found for: PHART, PCO2ART, PO2ART, HCO3, TCO2, ACIDBASEDEF, O2SAT   Coagulation Profile: Recent Labs  Lab 02/16/21 2105  INR 1.4*    Cardiac Enzymes: Recent Labs  Lab 02/16/21 2105  CKTOTAL 365    HbA1C: No results found for: HGBA1C  CBG: Recent Labs  Lab 02/17/21 0011  GLUCAP 184*    Review of Systems: Positives in BOLD   Gen: Denies fever, chills, weight change, fatigue, night sweats HEENT: Denies blurred vision, double vision, hearing loss, tinnitus, sinus congestion, rhinorrhea, sore throat, neck stiffness, dysphagia PULM: Denies shortness of breath, cough, sputum production, hemoptysis, wheezing CV: Denies chest pain, edema, orthopnea, paroxysmal nocturnal dyspnea, palpitations GI: abdominal pain, nausea, hematemesis, vomiting, diarrhea, hematochezia, melena, constipation, change in bowel habits GU: Denies dysuria, hematuria, polyuria, oliguria, urethral discharge Endocrine: Denies hot or cold intolerance, polyuria, polyphagia or appetite change Derm: Denies rash, dry skin, scaling or peeling skin change Heme: Denies easy bruising, bleeding, bleeding gums Neuro: Denies headache, numbness, weakness, slurred speech, loss  of memory or consciousness   Past Medical History:  He,  has a past medical history of IDA (iron deficiency anemia) (08/24/2020).   Surgical History:  History reviewed. No pertinent surgical history.   Social History:   reports that he has never smoked. He has never used smokeless tobacco. He reports previous alcohol use. He reports that he does not use drugs.   Family History:  His family history includes Cancer in his father; Heart attack in his brother; Lung cancer in his mother.   Allergies No Known Allergies   Home Medications  Prior to Admission medications   Medication Sig Start Date End Date Taking? Authorizing Provider  ferrous  sulfate 325 (65 FE) MG tablet Take 1 tablet (325 mg total) by mouth 2 (two) times daily with a meal. 08/19/20 12/17/20  Harvest Dark, MD  vitamin B-12 (CYANOCOBALAMIN) 1000 MCG tablet Take 1 tablet (1,000 mcg total) by mouth daily. 08/24/20   Earlie Server, MD     Marda Stalker, [place] Pager [phone] (please enter 7 digits) PCCM Consult Pager [phone] (please enter 7 digits)

## 2021-02-17 NOTE — Progress Notes (Signed)
Patient had two episodes of bright red blood after EGD procedure. Dr. Marius Ditch made aware.

## 2021-02-17 NOTE — Anesthesia Postprocedure Evaluation (Signed)
Anesthesia Post Note  Patient: Dameir Gentzler  Procedure(s) Performed: ESOPHAGOGASTRODUODENOSCOPY (EGD) WITH PROPOFOL (N/A )  Patient location during evaluation: Endoscopy Anesthesia Type: General Level of consciousness: awake and alert Pain management: pain level controlled Vital Signs Assessment: post-procedure vital signs reviewed and stable Respiratory status: spontaneous breathing, nonlabored ventilation, respiratory function stable and patient connected to nasal cannula oxygen Cardiovascular status: blood pressure returned to baseline and stable Postop Assessment: no apparent nausea or vomiting Anesthetic complications: no   No complications documented.   Last Vitals:  Vitals:   02/17/21 1540 02/17/21 1620  BP: 107/87   Pulse: (!) 104   Resp: 18   Temp:  36.6 C  SpO2: 100% 100%    Last Pain:  Vitals:   02/17/21 1620  TempSrc: Temporal  PainSc:                  Martha Clan

## 2021-02-17 NOTE — Progress Notes (Signed)
Patient had large volume bright red liquid stool, approx 562ml. Pt states he "feels great, no pain."   Dr. Merrilee Jansky notified.

## 2021-02-17 NOTE — Consult Note (Signed)
Cephas Darby, MD [address]  [place]  [place], [place] [address]  Main: [phone]  Fax: [phone] Pager: [phone]   Consultation  Referring Provider:     No ref. provider found Primary Care Physician:  System, Provider Not In Primary Gastroenterologist:    Unassigned      Reason for Consultation:     Hematemesis and melena  Date of Admission:  02/16/2021 Date of Consultation:  02/17/2021         HPI:   Terrence Martin is a 55 y.o. male with history of alcohol abuse, known history of iron deficiency anemia presented yesterday to ER, brought by EMS secondary to hematemesis as well as melena.  Patient was admitted to [facility] in [place] in July 2021 secondary to similar episodes and he was recommended to undergo upper endoscopy however patient refused to pursue at the time.  Patient had an active episode of hematemesis last night, that resulted in hypotension.  Also, labs revealed drop in hemoglobin from 8.7 at admission to 6.8 this morning.  His hemoglobin is 8.4 in 9/21, MCV 69.2.  He does have chronic thrombocytopenia, platelets 124, PT/INR 16.6/1.4.  He has elevated BUN/creatinine, LFTs revealed AST greater than ALT, total bilirubin 2.7, alkaline phosphatase 54.  Patient is started on octreotide, pantoprazole drips, ceftriaxone for SBP prophylaxis.  He is also on pressor support as well as maintenance IV fluids.  Patient persistently has been refusing to undergo upper endoscopy since admission per the ICU team.  When interviewed the patient, he was lying comfortably in bed, jittery in his voice, otherwise denies any abdominal pain, nausea or vomiting other than episodes overnight.  He is getting ready to receive 2 units of PRBCs.  He did receive blood transfusion overnight, did not respond appropriately.  His blood pressures are borderline low and ready to be started on pressor support.  He is oxygenating well on room air  Patient reports that he has been  drinking alcohol for quite some time, last drink was about 2 days ago.  His last brown bowel movement was on Monday.  He works as an Radio broadcast assistant at [employer]  He said, he is afraid of upper endoscopy as his father died after undergoing upper endoscopy about 10 years ago.  Apparently, he had a stroke and he was very sick before undergoing the procedure  NSAIDs: None  Antiplts/Anticoagulants/Anti thrombotics: None  GI Procedures: None  Past Medical History:  Diagnosis Date  . IDA (iron deficiency anemia) 08/24/2020    History reviewed. No pertinent surgical history.  Prior to Admission medications   Medication Sig Start Date End Date Taking? Authorizing Provider  ferrous sulfate 325 (65 FE) MG tablet Take 1 tablet (325 mg total) by mouth 2 (two) times daily with a meal. 08/19/20 12/17/20  Harvest Dark, MD  vitamin B-12 (CYANOCOBALAMIN) 1000 MCG tablet Take 1 tablet (1,000 mcg total) by mouth daily. 08/24/20   Earlie Server, MD    Current Facility-Administered Medications:  .  0.9 %  sodium chloride infusion (Manually program via Guardrails IV Fluids), , Intravenous, Once, Nelle Don, MD .  0.9 %  sodium chloride infusion, , Intravenous, Continuous, Doutova, Anastassia, MD, Last Rate: 125 mL/hr at 02/17/21 0830, Infusion Verify at 02/17/21 0830 .  cefTRIAXone (ROCEPHIN) 1 g in sodium chloride 0.9 % 100 mL IVPB, 1 g, Intravenous, Q24H, Doutova, Anastassia, MD .  Chlorhexidine Gluconate Cloth 2 % PADS 6 each, 6 each, Topical, Q0600, Doutova, Anastassia,  MD, 6 each at 02/17/21 0547 .  diazepam (VALIUM) injection 5 mg, 5 mg, Intravenous, Q6H, Nelle Don, MD .  insulin aspart (novoLOG) injection 0-9 Units, 0-9 Units, Subcutaneous, Q4H, Doutova, Anastassia, MD, 2 Units at 02/17/21 0755 .  [COMPLETED] octreotide (SANDOSTATIN) 2 mcg/mL load via infusion 50 mcg, 50 mcg, Intravenous, Once, 50 mcg at 02/16/21 2328 **AND** octreotide (SANDOSTATIN) 500 mcg in sodium chloride 0.9 % 250 mL (2  mcg/mL) infusion, 50 mcg/hr, Intravenous, Continuous, Doutova, Anastassia, MD, Last Rate: 25 mL/hr at 02/17/21 0941, 50 mcg/hr at 02/17/21 0941 .  ondansetron (ZOFRAN) tablet 4 mg, 4 mg, Oral, Q6H PRN **OR** ondansetron (ZOFRAN) injection 4 mg, 4 mg, Intravenous, Q6H PRN, Doutova, Anastassia, MD, 4 mg at 02/17/21 0301 .  pantoprazole (PROTONIX) 80 mg in sodium chloride 0.9 % 100 mL (0.8 mg/mL) infusion, 8 mg/hr, Intravenous, Continuous, Doutova, Anastassia, MD, Last Rate: 10 mL/hr at 02/17/21 1004, 8 mg/hr at 02/17/21 1004 .  [START ON 02/20/2021] pantoprazole (PROTONIX) injection 40 mg, 40 mg, Intravenous, Q12H, Doutova, Anastassia, MD .  phenylephrine (NEOSYNEPHRINE) 10-0.9 MG/250ML-% infusion, 0-100 mcg/min, Intravenous, Titrated, Nelle Don, MD, Held at 02/17/21 [phone] .  thiamine tablet 100 mg, 100 mg, Oral, Daily **OR** thiamine (B-1) injection 100 mg, 100 mg, Intravenous, Daily, Doutova, Anastassia, MD  Family History  Problem Relation Age of Onset  . Lung cancer Mother   . Cancer Father   . Heart attack Brother      Social History   Tobacco Use  . Smoking status: Never Smoker  . Smokeless tobacco: Never Used  Vaping Use  . Vaping Use: Never used  Substance Use Topics  . Alcohol use: Not Currently  . Drug use: Never    Allergies as of 02/16/2021  . (No Known Allergies)    Review of Systems:    All systems reviewed and negative except where noted in HPI.   Physical Exam:  Vital signs in last 24 hours: Temp:  [98 F (36.7 C)-98.6 F (37 C)] 98 F (36.7 C) (03/23 1007) Pulse Rate:  [107-138] 121 (03/23 1007) Resp:  [17-30] 20 (03/23 1007) BP: (68-135)/(36-102) 98/58 (03/23 1007) SpO2:  [92 %-100 %] 97 % (03/23 1007) Weight:  [99.8 kg] 99.8 kg (03/22 2103) Last BM Date: 02/17/21 General:   Pleasant, cooperative in NAD Head:  Normocephalic and atraumatic. Eyes:   No icterus.   Conjunctiva pale. PERRLA. Ears:  Normal auditory acuity. Neck:  Supple; no masses or  thyroidomegaly Lungs: Respirations even and unlabored. Lungs clear to auscultation bilaterally.   No wheezes, crackles, or rhonchi.  Heart:  Regular rate and rhythm;  Without murmur, clicks, rubs or gallops Abdomen:  Soft, nondistended, nontender. Normal bowel sounds. No appreciable masses or hepatomegaly.  No rebound or guarding.  Rectal:  Not performed. Msk:  Symmetrical without gross deformities.  Strength generalized weakness Extremities:  Without edema, cyanosis or clubbing. Neurologic:  Alert and oriented x3;  grossly normal neurologically. Skin:  Intact without significant lesions or rashes. Cervical Nodes:  No significant cervical adenopathy. Psych:  Alert and cooperative. Normal affect.  LAB RESULTS: CBC Latest Ref Rng & Units 02/17/2021 02/17/2021 02/16/2021  WBC 4.0 - 10.5 K/uL 11.9(H) 12.3(H) 10.0  Hemoglobin 13.0 - 17.0 g/dL 6.8(L) 6.6(L) 8.7(L)  Hematocrit 39.0 - 52.0 % 20.1(L) 19.9(L) 26.3(L)  Platelets 150 - 400 K/uL 124(L) 132(L) 148(L)    BMET BMP Latest Ref Rng & Units 02/17/2021 02/16/2021 08/19/2020  Glucose 70 - 99 mg/dL 174(H) 274(H) 108(H)  BUN 6 -  20 mg/dL 32(H) 28(H) 6  Creatinine 0.61 - 1.24 mg/dL 1.58(H) 1.11 0.72  Sodium 135 - 145 mmol/L 140 137 137  Potassium 3.5 - 5.1 mmol/L 4.7 4.4 3.4(L)  Chloride 98 - 111 mmol/L 113(H) 107 106  CO2 22 - 32 mmol/L 17(L) 19(L) 23  Calcium 8.9 - 10.3 mg/dL 7.3(L) 8.1(L) 8.1(L)    LFT Hepatic Function Latest Ref Rng & Units 02/17/2021 08/24/2020 11/09/2016  Total Protein 6.5 - 8.1 g/dL 4.5(L) 7.9 8.4(H)  Albumin 3.5 - 5.0 g/dL 1.8(L) 2.9(L) 3.3(L)  AST 15 - 41 U/L 62(H) 62(H) 97(H)  ALT 0 - 44 U/L 26 29 48  Alk Phosphatase 38 - 126 U/L 54 106 152(H)  Total Bilirubin 0.3 - 1.2 mg/dL 2.7(H) 1.5(H) 1.1  Bilirubin, Direct 0.0 - 0.2 mg/dL - 0.5(H) -     STUDIES: No results found.    Impression / Plan:   Ryer Asato is a 55 y.o. male with with history of alcohol abuse, alcoholic liver disease, likely cirrhosis of  liver presented with hematemesis and melena, hemorrhagic shock  Hematemesis and melena, hemorrhagic shock Aggressive resuscitation with IV fluids, pressor support if needed Blood transfusion to maintain hemoglobin above 7, platelets above 50 Continue multivitamin thiamine and folate Continue pantoprazole drip, octreotide drip Agree with ceftriaxone for SBP prophylaxis Complete abstinence from alcohol use Patient will need upper endoscopy, I had a lengthy discussion regarding the risks and benefit of procedure, I have addressed all his concerns and tried my best to reassure him about the endoscopy and significance of undergoing the procedure to figure of the source of upper GI bleed.  He said he will think about it and let me know by afternoon N.p.o. for now  Thank you for involving me in the care of this patient.  GI will follow along with you    LOS: 1 day   Sherri Sear, MD  02/17/2021, 10:08 AM   Note: This dictation was prepared with Dragon dictation along with smaller phrase technology. Any transcriptional errors that result from this process are unintentional.

## 2021-02-17 NOTE — Anesthesia Preprocedure Evaluation (Signed)
Anesthesia Evaluation  Patient identified by MRN, date of birth, ID band Patient awake    Reviewed: Allergy & Precautions, H&P , NPO status , Patient's Chart, lab work & pertinent test results, reviewed documented beta blocker date and time   History of Anesthesia Complications Negative for: history of anesthetic complications  Airway Mallampati: III  TM Distance: >3 FB Neck ROM: full    Dental  (+) Dental Advidsory Given, Missing, Teeth Intact   Pulmonary neg shortness of breath, asthma (as a child) , neg sleep apnea, neg COPD, Recent URI  (COVID in early February),    Pulmonary exam normal breath sounds clear to auscultation       Cardiovascular Exercise Tolerance: Good negative cardio ROS Normal cardiovascular exam Rhythm:regular Rate:Normal     Neuro/Psych PSYCHIATRIC DISORDERS negative neurological ROS     GI/Hepatic Neg liver ROS, PUD, neg GERD  ,  Endo/Other  negative endocrine ROS  Renal/GU negative Renal ROS  negative genitourinary   Musculoskeletal   Abdominal   Peds  Hematology negative hematology ROS (+)   Anesthesia Other Findings Past Medical History: No date: GIB (gastrointestinal bleeding) 08/24/2020: IDA (iron deficiency anemia) No date: PUD (peptic ulcer disease)   Reproductive/Obstetrics negative OB ROS                             Anesthesia Physical Anesthesia Plan  ASA: II  Anesthesia Plan: General   Post-op Pain Management:    Induction: Intravenous  PONV Risk Score and Plan: 2 and TIVA and Propofol infusion  Airway Management Planned: Natural Airway and Nasal Cannula  Additional Equipment:   Intra-op Plan:   Post-operative Plan:   Informed Consent: I have reviewed the patients History and Physical, chart, labs and discussed the procedure including the risks, benefits and alternatives for the proposed anesthesia with the patient or authorized  representative who has indicated his/her understanding and acceptance.     Dental Advisory Given  Plan Discussed with: Anesthesiologist, CRNA and Surgeon  Anesthesia Plan Comments:         Anesthesia Quick Evaluation

## 2021-02-17 NOTE — Progress Notes (Signed)
   02/17/21 1400  Clinical Encounter Type  Visited With Patient  Visit Type Initial;Psychological support;Spiritual support;Social support  Referral From Nurse  Consult/Referral To [place] (For Healthcare)  Does Patient Have a Medical Advance Directive? No  Would patient like information on creating a medical advance directive? Yes (Inpatient - patient requests chaplain consult to create a medical advance directive)  [place] Directives  Does Patient Have a Mental Health Advance Directive? No  Would patient like information on creating a mental health advance directive? No - Patient declined   Chaplain responded to a page form nurse, regarding the PT wanting an AD done. Chaplain did AD education with the PT. Chaplain will have the AD  the on-call Chaplain have it to be notarized tomorrow morning. Chaplain provided spiritual and emotional care for Mr. Forst.

## 2021-02-17 NOTE — Transfer of Care (Signed)
Immediate Anesthesia Transfer of Care Note  Patient: Terrence Martin  Procedure(s) Performed: ESOPHAGOGASTRODUODENOSCOPY (EGD) WITH PROPOFOL (N/A )  Patient Location: Endoscopy Unit  Anesthesia Type:General  Level of Consciousness: sedated  Airway & Oxygen Therapy: Patient connected to nasal cannula oxygen  Post-op Assessment: Post -op Vital signs reviewed and stable  Post vital signs: Reviewed and stable  Last Vitals:  Vitals Value Taken Time  BP 97/57   Temp    Pulse 98   Resp 22   SpO2 100     Last Pain:  Vitals:   02/17/21 1620  TempSrc: Temporal  PainSc:          Complications: No complications documented.

## 2021-02-17 NOTE — Progress Notes (Addendum)
Second bloody stool this shift, see flowsheet for documentation. Dr. Posey Pronto and ICU team made aware. Discussed medical decision making with patient, he states he would like for his girlfriend to be his medical decision maker should he become incapacitated. Chaplain paged to come to bedside for HCPOA paperwork to be completed.

## 2021-02-17 NOTE — ED Notes (Addendum)
0030 This RN at bedside to medicate pt. Upon entry to room pt states that he needs to vomit and wants to get up and go to the bedside toilet because when he was at home he would have a BM when he vomited. Pt denies lightheadedness, dizziness and states he feels okay to walk to toilet. This RN and pt's girlfriend, Terrence Martin, walked with patient to toilet. About half way to the toilet, pt states he has to vomit. Pt then suddenly got very weak and was assisted to the floor by this RN and pt's girlfriend. Pt then has 3-4 large bloody emesis with clots. No LOC/syncopal episode. Another RN brought to bedside to assist with transfer back to stretcher. Pt allowed to sit on floor for a few minutes d/t reports of dizziness.  0033 Pt transferred back to stretcher. Pt diaphoretic. Placed on 2L [place] for comfort. Pt cleaned, gown changed, and pt placed back on monitor.   0040 BP 76/36. Doutova MD at bedside. 1L NS started.   0042 fluids placed on pressure bag

## 2021-02-17 NOTE — ED Notes (Signed)
Pt transferred to [place] with no issues

## 2021-02-17 NOTE — ED Notes (Signed)
Critical care NP and CCU charge RN at bedside

## 2021-02-17 NOTE — Plan of Care (Signed)
  Problem: Education: Goal: Knowledge of General Education information will improve Description: Including pain rating scale, medication(s)/side effects and non-pharmacologic comfort measures Outcome: Not Progressing   Problem: Health Behavior/Discharge Planning: Goal: Ability to manage health-related needs will improve Outcome: Not Progressing   Problem: Clinical Measurements: Goal: Ability to maintain clinical measurements within normal limits will improve Outcome: Not Progressing Goal: Will remain free from infection Outcome: Not Progressing Goal: Diagnostic test results will improve Outcome: Not Progressing Goal: Respiratory complications will improve Outcome: Not Progressing Goal: Cardiovascular complication will be avoided Outcome: Not Progressing   Problem: Activity: Goal: Risk for activity intolerance will decrease Outcome: Not Progressing   Problem: Nutrition: Goal: Adequate nutrition will be maintained Outcome: Not Progressing   Problem: Coping: Goal: Level of anxiety will decrease Outcome: Not Progressing   Problem: Elimination: Goal: Will not experience complications related to bowel motility Outcome: Not Progressing Goal: Will not experience complications related to urinary retention Outcome: Not Progressing   Problem: Pain Managment: Goal: General experience of comfort will improve Outcome: Not Progressing   Problem: Safety: Goal: Ability to remain free from injury will improve Outcome: Not Progressing   Problem: Skin Integrity: Goal: Risk for impaired skin integrity will decrease Outcome: Not Progressing   Problem: Education: Goal: Ability to identify signs and symptoms of gastrointestinal bleeding will improve Outcome: Not Progressing   Problem: Bowel/Gastric: Goal: Will show no signs and symptoms of gastrointestinal bleeding Outcome: Not Progressing   Problem: Fluid Volume: Goal: Will show no signs and symptoms of excessive  bleeding Outcome: Not Progressing   Problem: Clinical Measurements: Goal: Complications related to the disease process, condition or treatment will be avoided or minimized Outcome: Not Progressing   

## 2021-02-17 NOTE — Op Note (Signed)
[facility] Gastroenterology Patient Name: Terrence Martin Procedure Date: 02/17/2021 3:17 PM MRN: [id number] Account #: [id number] Date of Birth: [birth year] Admit Type: Inpatient Age: 55 Room: [facility] ENDO ROOM 4 Gender: Male Note Status: Finalized Procedure:             Upper GI endoscopy Indications:           Acute post hemorrhagic anemia, Hematemesis, Melena Providers:             Lin Landsman MD, MD Medicines:             General Anesthesia Complications:         No immediate complications. Estimated blood loss: None. Procedure:             Pre-Anesthesia Assessment:                        - Prior to the procedure, a History and Physical was                         performed, and patient medications and allergies were                         reviewed. The patient is competent. The risks and                         benefits of the procedure and the sedation options and                         risks were discussed with the patient. All questions                         were answered and informed consent was obtained.                         Patient identification and proposed procedure were                         verified by the physician, the nurse, the                         anesthesiologist, the anesthetist and the technician                         in the pre-procedure area in the procedure room in the                         endoscopy suite. Mental Status Examination: alert and                         oriented. Airway Examination: normal oropharyngeal                         airway and neck mobility. Respiratory Examination:                         clear to auscultation. CV Examination: normal.                         Prophylactic Antibiotics: The patient  does not require                         prophylactic antibiotics. Prior Anticoagulants: The                         patient has taken no previous anticoagulant or                         antiplatelet  agents. ASA Grade Assessment: III - A                         patient with severe systemic disease. After reviewing                         the risks and benefits, the patient was deemed in                         satisfactory condition to undergo the procedure. The                         anesthesia plan was to use general anesthesia.                         Immediately prior to administration of medications,                         the patient was re-assessed for adequacy to receive                         sedatives. The heart rate, respiratory rate, oxygen                         saturations, blood pressure, adequacy of pulmonary                         ventilation, and response to care were monitored                         throughout the procedure. The physical status of the                         patient was re-assessed after the procedure.                        After obtaining informed consent, the endoscope was                         passed under direct vision. Throughout the procedure,                         the patient's blood pressure, pulse, and oxygen                         saturations were monitored continuously. The Endoscope                         was introduced through the mouth, and advanced to the  second part of duodenum. The upper GI endoscopy was                         accomplished without difficulty. The patient tolerated                         the procedure well. Findings:      The duodenal bulb and second portion of the duodenum were normal.      Severe portal hypertensive gastropathy was found in the gastric fundus,       no evidence of gastric varices.      The gastric body, incisura and gastric antrum were normal.      Three columns of large (> 5 mm) varices with stigmata of recent bleeding       were found in the lower third of the esophagus, 38 cm from the incisors.       They were large in size. Red wale signs were present.  Three bands were       successfully placed at 38 and 35cm with incomplete eradication of       varices. There was no bleeding during and at the end of the procedure.       Estimated blood loss: none. Impression:            - Normal duodenal bulb and second portion of the                         duodenum.                        - Portal hypertensive gastropathy.                        - Normal gastric body, incisura and antrum.                        - Large (> 5 mm) esophageal varices with stigmata of                         recent bleeding. Incompletely eradicated. Banded.                        - No specimens collected. Recommendation:        - Return patient to ICU for ongoing care.                        - NPO today.                        - Continue present medications.                        - Repeat upper endoscopy in 4 weeks for endoscopic                         band ligation.                        - Complete abstinance from ETOH use Procedure Code(s):     --- Professional ---  43244, Esophagogastroduodenoscopy, flexible,                         transoral; with band ligation of esophageal/gastric                         varices Diagnosis Code(s):     --- Professional ---                        K76.6, Portal hypertension                        K31.89, Other diseases of stomach and duodenum                        I85.01, Esophageal varices with bleeding                        D62, Acute posthemorrhagic anemia                        K92.0, Hematemesis                        K92.1, Melena (includes Hematochezia) CPT copyright 2019 [employer]. All rights reserved. The codes documented in this report are preliminary and upon coder review may  be revised to meet current compliance requirements. Dr. Ulyess Mort Lin Landsman MD, MD 02/17/2021 4:18:00 PM This report has been signed electronically. Number of Addenda: 0 Note Initiated  On: 02/17/2021 3:17 PM Estimated Blood Loss:  Estimated blood loss: none.      [facility]

## 2021-02-18 ENCOUNTER — Inpatient Hospital Stay: Payer: BC Managed Care – PPO

## 2021-02-18 LAB — COMPREHENSIVE METABOLIC PANEL
ALT: 48 U/L — ABNORMAL HIGH (ref 0–44)
AST: 134 U/L — ABNORMAL HIGH (ref 15–41)
Albumin: 2.2 g/dL — ABNORMAL LOW (ref 3.5–5.0)
Alkaline Phosphatase: 56 U/L (ref 38–126)
Anion gap: 5 (ref 5–15)
BUN: 36 mg/dL — ABNORMAL HIGH (ref 6–20)
CO2: 23 mmol/L (ref 22–32)
Calcium: 7.6 mg/dL — ABNORMAL LOW (ref 8.9–10.3)
Chloride: 115 mmol/L — ABNORMAL HIGH (ref 98–111)
Creatinine, Ser: 1.24 mg/dL (ref 0.61–1.24)
GFR, Estimated: >60 mL/min (ref >60)
Glucose, Bld: 163 mg/dL — ABNORMAL HIGH (ref 70–99)
Potassium: 4.4 mmol/L (ref 3.5–5.1)
Sodium: 143 mmol/L (ref 135–145)
Total Bilirubin: 2.9 mg/dL — ABNORMAL HIGH (ref 0.3–1.2)
Total Protein: 5.1 g/dL — ABNORMAL LOW (ref 6.5–8.1)

## 2021-02-18 LAB — ECHOCARDIOGRAM COMPLETE
AR max vel: 1.68 cm2
AV Area VTI: 1.98 cm2
AV Area mean vel: 1.86 cm2
AV Mean grad: 11 mmHg
AV Peak grad: 21.4 mmHg
Ao pk vel: 2.31 m/s
Area-P 1/2: 3.91 cm2
Height: 67 in
S' Lateral: 2.98 cm
Weight: 3520 oz

## 2021-02-18 LAB — CBC
HCT: 22.1 % — ABNORMAL LOW (ref 39.0–52.0)
HCT: 23.8 % — ABNORMAL LOW (ref 39.0–52.0)
Hemoglobin: 7.7 g/dL — ABNORMAL LOW (ref 13.0–17.0)
Hemoglobin: 8.1 g/dL — ABNORMAL LOW (ref 13.0–17.0)
MCH: 31.4 pg (ref 26.0–34.0)
MCH: 31.3 pg (ref 26.0–34.0)
MCHC: 34.8 g/dL (ref 30.0–36.0)
MCHC: 34 g/dL (ref 30.0–36.0)
MCV: 90.2 fL (ref 80.0–100.0)
MCV: 91.9 fL (ref 80.0–100.0)
Platelets: 67 10*3/uL — ABNORMAL LOW (ref 150–400)
Platelets: 67 10*3/uL — ABNORMAL LOW (ref 150–400)
RBC: 2.45 MIL/uL — ABNORMAL LOW (ref 4.22–5.81)
RBC: 2.59 MIL/uL — ABNORMAL LOW (ref 4.22–5.81)
RDW: 16 % — ABNORMAL HIGH (ref 11.5–15.5)
RDW: 16.6 % — ABNORMAL HIGH (ref 11.5–15.5)
WBC: 9.1 10*3/uL (ref 4.0–10.5)
WBC: 12 10*3/uL — ABNORMAL HIGH (ref 4.0–10.5)
nRBC: 0.3 % — ABNORMAL HIGH (ref 0.0–0.2)
nRBC: 0.6 % — ABNORMAL HIGH (ref 0.0–0.2)

## 2021-02-18 LAB — GLUCOSE, CAPILLARY
Glucose-Capillary: 137 mg/dL — ABNORMAL HIGH (ref 70–99)
Glucose-Capillary: 144 mg/dL — ABNORMAL HIGH (ref 70–99)
Glucose-Capillary: 159 mg/dL — ABNORMAL HIGH (ref 70–99)
Glucose-Capillary: 161 mg/dL — ABNORMAL HIGH (ref 70–99)
Glucose-Capillary: 162 mg/dL — ABNORMAL HIGH (ref 70–99)
Glucose-Capillary: 165 mg/dL — ABNORMAL HIGH (ref 70–99)
Glucose-Capillary: 209 mg/dL — ABNORMAL HIGH (ref 70–99)

## 2021-02-18 LAB — BPAM FFP
Blood Product Expiration Date: 202203282359
Blood Product Expiration Date: 202203282359
ISSUE DATE / TIME: 202203230244
ISSUE DATE / TIME: 202203230325
Unit Type and Rh: 5100
Unit Type and Rh: 5100

## 2021-02-18 LAB — HEPATITIS PANEL, ACUTE
HCV Ab: NONREACTIVE
Hep A IgM: NONREACTIVE
Hep B C IgM: NONREACTIVE
Hepatitis B Surface Ag: NONREACTIVE

## 2021-02-18 LAB — PREPARE FRESH FROZEN PLASMA: Unit division: 0

## 2021-02-18 LAB — HEMOGLOBIN A1C
Hgb A1c MFr Bld: 4.4 % — ABNORMAL LOW (ref 4.8–5.6)
Mean Plasma Glucose: 80 mg/dL

## 2021-02-18 LAB — LACTIC ACID, PLASMA: Lactic Acid, Venous: 2.8 mmol/L (ref 0.5–1.9)

## 2021-02-18 MED ORDER — BOOST / RESOURCE BREEZE PO LIQD CUSTOM
1.0000 | Freq: Three times a day (TID) | ORAL | Status: DC
  Administered 2021-02-18 – 2021-02-19 (×3): 1 via ORAL

## 2021-02-18 NOTE — Progress Notes (Signed)
[place] [facility] [place] NAME: Terrence Martin    MR#:  [id number]  DATE OF BIRTH:  1966/05/12  SUBJECTIVE:   Patient feels and looks a lot better. Tolerating full liquid diet. No more hematemesis. Some old dark blood per rectum denies abdominal pain or shakiness. No family in the room. REVIEW OF SYSTEMS:   Review of Systems  Constitutional: Negative for chills, fever and weight loss.  HENT: Negative for ear discharge, ear pain and nosebleeds.   Eyes: Negative for blurred vision, pain and discharge.  Respiratory: Negative for sputum production, shortness of breath, wheezing and stridor.   Cardiovascular: Negative for chest pain, palpitations, orthopnea and PND.  Gastrointestinal: Positive for blood in stool and vomiting. Negative for abdominal pain, diarrhea and nausea.  Genitourinary: Negative for frequency and urgency.  Musculoskeletal: Negative for back pain and joint pain.  Neurological: Negative for sensory change, speech change, focal weakness and weakness.  Psychiatric/Behavioral: Negative for depression and hallucinations. The patient is not nervous/anxious.    Tolerating Diet: full liquid Tolerating PT: ambulatory  DRUG ALLERGIES:  No Known Allergies  VITALS:  Blood pressure 135/68, pulse 87, temperature 97.9 F (36.6 C), temperature source Oral, resp. rate 20, height 5\' 7"  (1.702 m), weight 99.8 kg, SpO2 100 %.  PHYSICAL EXAMINATION:   Physical Exam  GENERAL:  55 y.o.-year-old patient lying in the bed with no acute distress.  HEENT: Head atraumatic, normocephalic. Oropharynx and nasopharynx clear. Pallor+ LUNGS: Normal breath sounds bilaterally, no wheezing, rales, rhonchi. No use of accessory muscles of respiration.  CARDIOVASCULAR: S1, S2 normal. No murmurs, rubs, or gallops.  ABDOMEN: Soft, nontender, nondistended. Bowel sounds present. No organomegaly or mass.  EXTREMITIES: No cyanosis, clubbing or edema b/l.    NEUROLOGIC:  Cranial nerves II through XII are intact. No focal Motor or sensory deficits b/l.   PSYCHIATRIC:  patient is alert and oriented x 3.  SKIN: No obvious rash, lesion, or ulcer.   LABORATORY PANEL:  CBC Recent Labs  Lab 02/18/21 1207  WBC 12.0*  HGB 8.1*  HCT 23.8*  PLT 67*    Chemistries  Recent Labs  Lab 02/17/21 0646 02/18/21 0402  NA 140 143  K 4.7 4.4  CL 113* 115*  CO2 17* 23  GLUCOSE 174* 163*  BUN 32* 36*  CREATININE 1.58* 1.24  CALCIUM 7.3* 7.6*  MG 1.9  --   AST 62* 134*  ALT 26 48*  ALKPHOS 54 56  BILITOT 2.7* 2.9*   Cardiac Enzymes No results for input(s): TROPONINI in the last 168 hours. RADIOLOGY:  ECHOCARDIOGRAM COMPLETE  Result Date: 02/18/2021    ECHOCARDIOGRAM REPORT   Patient Name:   Terrence Martin Date of Exam: 02/17/2021 Medical Rec #:  [id number]   Height:       67.0 in Accession #:    [id number]  Weight:       220.0 lb Date of Birth:  [birth year]   BSA:          2.106 m Patient Age:    55 years    BP:           98/46 mmHg Patient Gender: M           HR:           93 bpm. Exam Location:  [facility] Procedure: 2D Echo, Cardiac Doppler, Color Doppler and Intracardiac            Opacification Agent Indications:  Elevated Troponin  History:         Patient has no prior history of Echocardiogram examinations.  Sonographer:     Wilford Sports Rodgers-Jones Referring Phys:  [id number] Kate Sable Diagnosing Phys: Ida Rogue MD IMPRESSIONS  1. Left ventricular ejection fraction, by estimation, is 60 to 65%. The left ventricle has normal function. The left ventricle has no regional wall motion abnormalities. Left ventricular diastolic parameters are consistent with Grade I diastolic dysfunction (impaired relaxation).  2. Right ventricular systolic function is normal. The right ventricular size is normal. Tricuspid regurgitation signal is inadequate for assessing PA pressure.  3. Left atrial size was moderately dilated. FINDINGS  Left Ventricle: Left ventricular ejection  fraction, by estimation, is 60 to 65%. The left ventricle has normal function. The left ventricle has no regional wall motion abnormalities. Definity contrast agent was given IV to delineate the left ventricular  endocardial borders. The left ventricular internal cavity size was normal in size. There is no left ventricular hypertrophy. Left ventricular diastolic parameters are consistent with Grade I diastolic dysfunction (impaired relaxation). Right Ventricle: The right ventricular size is normal. No increase in right ventricular wall thickness. Right ventricular systolic function is normal. Tricuspid regurgitation signal is inadequate for assessing PA pressure. Left Atrium: Left atrial size was moderately dilated. Right Atrium: Right atrial size was normal in size. Pericardium: There is no evidence of pericardial effusion. Mitral Valve: The mitral valve is normal in structure. No evidence of mitral valve regurgitation. No evidence of mitral valve stenosis. Tricuspid Valve: The tricuspid valve is normal in structure. Tricuspid valve regurgitation is mild . No evidence of tricuspid stenosis. Aortic Valve: The aortic valve is normal in structure. Aortic valve regurgitation is not visualized. Mild aortic valve sclerosis is present, with no evidence of aortic valve stenosis. Aortic valve mean gradient measures 11.0 mmHg. Aortic valve peak gradient measures 21.4 mmHg. Aortic valve area, by VTI measures 1.98 cm. Pulmonic Valve: The pulmonic valve was normal in structure. Pulmonic valve regurgitation is not visualized. No evidence of pulmonic stenosis. Aorta: The aortic root is normal in size and structure. Venous: The inferior vena cava is normal in size with greater than 50% respiratory variability, suggesting right atrial pressure of 3 mmHg. IAS/Shunts: No atrial level shunt detected by color flow Doppler.  LEFT VENTRICLE PLAX 2D LVIDd:         4.48 cm  Diastology LVIDs:         2.98 cm  LV e' medial:    9.25 cm/s LV  PW:         0.83 cm  LV E/e' medial:  7.3 LV IVS:        0.81 cm  LV e' lateral:   14.50 cm/s LVOT diam:     2.30 cm  LV E/e' lateral: 4.6 LV SV:         72 LV SV Index:   34 LVOT Area:     4.15 cm  RIGHT VENTRICLE RV Basal diam:  3.98 cm RV S prime:     20.00 cm/s TAPSE (M-mode): 2.6 cm LEFT ATRIUM             Index       RIGHT ATRIUM           Index LA diam:        4.80 cm 2.28 cm/m  RA Area:     13.60 cm LA Vol (A2C):   41.1 ml 19.52 ml/m RA Volume:   33.10 ml  15.72 ml/m LA Vol (A4C):   34.3 ml 16.29 ml/m LA Biplane Vol: 38.3 ml 18.19 ml/m  AORTIC VALVE AV Area (Vmax):    1.68 cm AV Area (Vmean):   1.86 cm AV Area (VTI):     1.98 cm AV Vmax:           231.25 cm/s AV Vmean:          155.000 cm/s AV VTI:            0.362 m AV Peak Grad:      21.4 mmHg AV Mean Grad:      11.0 mmHg LVOT Vmax:         93.25 cm/s LVOT Vmean:        69.450 cm/s LVOT VTI:          0.172 m LVOT/AV VTI ratio: 0.48  AORTA Ao Root diam: 3.10 cm MITRAL VALVE MV Area (PHT): 3.91 cm    SHUNTS MV Decel Time: 194 msec    Systemic VTI:  0.17 m MV E velocity: 67.30 cm/s  Systemic Diam: 2.30 cm MV A velocity: 86.50 cm/s MV E/A ratio:  0.78 Ida Rogue MD Electronically signed by Ida Rogue MD Signature Date/Time: 02/18/2021/11:53:16 AM    Final    ASSESSMENT AND PLAN:  Terrence Martin is a 55 y.o. male with medical history significant of PUD and anemia, alcohol abuse  Presented with   Black tarry stools since 7 AM and bright red blood in vomit x 3 large amount each with large amount of clots  EMS was called and gave 400 ml IV fluids Hx of GI bleed in [place] refused endoscopy at that time, was treated with blood transfusion and stabalized He continues to drink 3-4 beers per day and 12 pack on the weekends.  G.I. bleed/hemorrhagic shock/hypotension/lactic acidosis variceal bleed alcohol abuse/alcoholic liver disease -- continue aggressive IV fluids, pressor support if needed -- patient is status post for unit blood  transfusion -- hemoglobin 6.1--- two unit blood transfusion-- 6.8--- two more units--- 8.5--8.1 -- patient underwent EGD that showed larger varices status post three bands -- IV Protonix for 72 hours -- IV Octreotide for 72 hours -- patient tolerating full liquid diet -- start on beta-blocker nadolol or propranolol from tomorrow.per G.I.  Elevated troponin suspect demand ischemia with G.I. bleed and hypotension -- cardiology consultation  with [facility] appreciated. -- monitor troponin 310--146 -- patient has no history of cardiac disease -- echo of the heart noted EF of 60 to 65%. -- avoid antiplatelet due to G.I. bleed  Thrombocytopenia secondary to chronic alcoholism -- platelet count 124-- 81 K  Hyperglycemia could be secondary to current illness -- continue sliding scale -- A1c 4.4% -- patient will need close monitoring as outpatient   Will transfer out of ICU  Procedures: EGD Family communication :none today Consults : G.I., ICU CODE STATUS: full DVT Prophylaxis : SCD Level of care: Med-Surg Status is: Inpatient  Remains inpatient appropriate because:IV treatments appropriate due to intensity of illness or inability to take PO and Inpatient level of care appropriate due to severity of illness   Dispo: The patient is from: Home              Anticipated d/c is to: Home              Patient currently is not medically stable to d/c.   Difficult to place patient No  Will move patient out of the ICU monitor monitor for 1  to 2 days. Still on IV drips which need to be on for 72 hours.      TOTAL TIME TAKING CARE OF THIS PATIENT: 25 minutes.  >50% time spent on counselling and coordination of care  Note: This dictation was prepared with Dragon dictation along with smaller phrase technology. Any transcriptional errors that result from this process are unintentional.  Fritzi Mandes M.D    [facility]   CC: Primary care physician; System, Provider Not InPatient ID:  Terrence Martin, male   DOB: Oct 17, 1966, 55 y.o.   MRN: [id number]

## 2021-02-18 NOTE — Progress Notes (Signed)
Pt A&OX4, VSS on rm air. To and from liver doppler. HGB stable at this time. No signs of bleeding so far this shift. Tolerating full liquid diet. Report given to 1C RN.

## 2021-02-18 NOTE — Progress Notes (Signed)
Patient arrived to unit via wheelchair. Restarted IV medications. Patient has call bell in reach. No complaints of pain or distress. Bed in low position and call bell in reach.

## 2021-02-18 NOTE — Progress Notes (Signed)
Terrence Darby, MD [address]  [place]  [place], [place] [address]  Main: [phone]  Fax: [phone] Pager: [phone]   Subjective: No acute events overnight.  Patient had 2 episodes of hematochezia after EGD.  He has been hemodynamically stable.  He denies any further episodes overnight or this morning.  He feels hungry and is asking if he can drink some water.  Objective: Vital signs in last 24 hours: Vitals:   02/18/21 0600 02/18/21 0700 02/18/21 0800 02/18/21 1100  BP: 117/63 113/66 121/60 137/72  Pulse:    87  Resp: 18 (!) 21 (!) 21   Temp:   98.9 F (37.2 C)   TempSrc:   Oral   SpO2:   99%   Weight:      Height:       Weight change: 0 kg  Intake/Output Summary (Last 24 hours) at 02/18/2021 1216 Last data filed at 02/18/2021 0951 Gross per 24 hour  Intake 2404.58 ml  Output 2065 ml  Net 339.58 ml     Exam: Heart:: Regular rate and rhythm, S1S2 present or without murmur or extra heart sounds Lungs: normal and clear to auscultation Abdomen: soft, nontender, normal bowel sounds   Lab Results: CBC Latest Ref Rng & Units 02/18/2021 02/17/2021 02/17/2021  WBC 4.0 - 10.5 K/uL 9.1 9.1 10.0  Hemoglobin 13.0 - 17.0 g/dL 7.7(L) 8.4(L) 8.1(L)  Hematocrit 39.0 - 52.0 % 22.1(L) 24.6(L) 23.1(L)  Platelets 150 - 400 K/uL 67(L) 67(L) 75(L)   CMP Latest Ref Rng & Units 02/18/2021 02/17/2021 02/16/2021  Glucose 70 - 99 mg/dL 163(H) 174(H) 274(H)  BUN 6 - 20 mg/dL 36(H) 32(H) 28(H)  Creatinine 0.61 - 1.24 mg/dL 1.24 1.58(H) 1.11  Sodium 135 - 145 mmol/L 143 140 137  Potassium 3.5 - 5.1 mmol/L 4.4 4.7 4.4  Chloride 98 - 111 mmol/L 115(H) 113(H) 107  CO2 22 - 32 mmol/L 23 17(L) 19(L)  Calcium 8.9 - 10.3 mg/dL 7.6(L) 7.3(L) 8.1(L)  Total Protein 6.5 - 8.1 g/dL 5.1(L) 4.5(L) -  Total Bilirubin 0.3 - 1.2 mg/dL 2.9(H) 2.7(H) -  Alkaline Phos 38 - 126 U/L 56 54 -  AST 15 - 41 U/L 134(H) 62(H) -  ALT 0 - 44 U/L 48(H) 26 -    Micro Results: Recent Results  (from the past 240 hour(s))  Resp Panel by RT-PCR (Flu A&B, Covid) Nasopharyngeal Swab     Status: None   Collection Time: 02/16/21 10:26 PM   Specimen: Nasopharyngeal Swab; Nasopharyngeal(NP) swabs in vial transport medium  Result Value Ref Range Status   SARS Coronavirus 2 by RT PCR NEGATIVE NEGATIVE Final    Comment: (NOTE) SARS-CoV-2 target nucleic acids are NOT DETECTED.  The SARS-CoV-2 RNA is generally detectable in upper respiratory specimens during the acute phase of infection. The lowest concentration of SARS-CoV-2 viral copies this assay can detect is 138 copies/mL. A negative result does not preclude SARS-Cov-2 infection and should not be used as the sole basis for treatment or other patient management decisions. A negative result may occur with  improper specimen collection/handling, submission of specimen other than nasopharyngeal swab, presence of viral mutation(s) within the areas targeted by this assay, and inadequate number of viral copies(<138 copies/mL). A negative result must be combined with clinical observations, patient history, and epidemiological information. The expected result is Negative.  Fact Sheet for Patients:  EntrepreneurPulse.com.au  Fact Sheet for Healthcare Providers:  IncredibleEmployment.be  This test is no t yet approved or cleared  by the [place] and  has been authorized for detection and/or diagnosis of SARS-CoV-2 by FDA under an Emergency Use Authorization (EUA). This EUA will remain  in effect (meaning this test can be used) for the duration of the COVID-19 declaration under Section 564(b)(1) of the Act, 21 U.S.C.section 360bbb-3(b)(1), unless the authorization is terminated  or revoked sooner.       Influenza A by PCR NEGATIVE NEGATIVE Final   Influenza B by PCR NEGATIVE NEGATIVE Final    Comment: (NOTE) The Xpert Xpress SARS-CoV-2/FLU/RSV plus assay is intended as an aid in the  diagnosis of influenza from Nasopharyngeal swab specimens and should not be used as a sole basis for treatment. Nasal washings and aspirates are unacceptable for Xpert Xpress SARS-CoV-2/FLU/RSV testing.  Fact Sheet for Patients: EntrepreneurPulse.com.au  Fact Sheet for Healthcare Providers: IncredibleEmployment.be  This test is not yet approved or cleared by the [place] FDA and has been authorized for detection and/or diagnosis of SARS-CoV-2 by FDA under an Emergency Use Authorization (EUA). This EUA will remain in effect (meaning this test can be used) for the duration of the COVID-19 declaration under Section 564(b)(1) of the Act, 21 U.S.C. section 360bbb-3(b)(1), unless the authorization is terminated or revoked.  Performed at [facility], [address] [place], [place] [address]   MRSA PCR Screening     Status: None   Collection Time: 02/17/21  2:01 AM   Specimen: Nasopharyngeal  Result Value Ref Range Status   MRSA by PCR NEGATIVE NEGATIVE Final    Comment:        The GeneXpert MRSA Assay (FDA approved for NASAL specimens only), is one component of a comprehensive MRSA colonization surveillance program. It is not intended to diagnose MRSA infection nor to guide or monitor treatment for MRSA infections. Performed at [facility], [place], [place] [address]    Studies/Results: ECHOCARDIOGRAM COMPLETE  Result Date: 02/18/2021    ECHOCARDIOGRAM REPORT   Patient Name:   Terrence Martin Date of Exam: 02/17/2021 Medical Rec #:  [id number]   Height:       67.0 in Accession #:    [id number]  Weight:       220.0 lb Date of Birth:  [birth year]   BSA:          2.106 m Patient Age:    55 years    BP:           98/46 mmHg Patient Gender: M           HR:           93 bpm. Exam Location:  [facility] Procedure: 2D Echo, Cardiac Doppler, Color Doppler and Intracardiac            Opacification Agent Indications:      Elevated Troponin  History:         Patient has no prior history of Echocardiogram examinations.  Sonographer:     Wilford Sports Rodgers-Jones Referring Phys:  [id number] Kate Sable Diagnosing Phys: Ida Rogue MD IMPRESSIONS  1. Left ventricular ejection fraction, by estimation, is 60 to 65%. The left ventricle has normal function. The left ventricle has no regional wall motion abnormalities. Left ventricular diastolic parameters are consistent with Grade I diastolic dysfunction (impaired relaxation).  2. Right ventricular systolic function is normal. The right ventricular size is normal. Tricuspid regurgitation signal is inadequate for assessing PA pressure.  3. Left atrial size was moderately dilated. FINDINGS  Left Ventricle: Left ventricular ejection  fraction, by estimation, is 60 to 65%. The left ventricle has normal function. The left ventricle has no regional wall motion abnormalities. Definity contrast agent was given IV to delineate the left ventricular  endocardial borders. The left ventricular internal cavity size was normal in size. There is no left ventricular hypertrophy. Left ventricular diastolic parameters are consistent with Grade I diastolic dysfunction (impaired relaxation). Right Ventricle: The right ventricular size is normal. No increase in right ventricular wall thickness. Right ventricular systolic function is normal. Tricuspid regurgitation signal is inadequate for assessing PA pressure. Left Atrium: Left atrial size was moderately dilated. Right Atrium: Right atrial size was normal in size. Pericardium: There is no evidence of pericardial effusion. Mitral Valve: The mitral valve is normal in structure. No evidence of mitral valve regurgitation. No evidence of mitral valve stenosis. Tricuspid Valve: The tricuspid valve is normal in structure. Tricuspid valve regurgitation is mild . No evidence of tricuspid stenosis. Aortic Valve: The aortic valve is normal in structure. Aortic valve  regurgitation is not visualized. Mild aortic valve sclerosis is present, with no evidence of aortic valve stenosis. Aortic valve mean gradient measures 11.0 mmHg. Aortic valve peak gradient measures 21.4 mmHg. Aortic valve area, by VTI measures 1.98 cm. Pulmonic Valve: The pulmonic valve was normal in structure. Pulmonic valve regurgitation is not visualized. No evidence of pulmonic stenosis. Aorta: The aortic root is normal in size and structure. Venous: The inferior vena cava is normal in size with greater than 50% respiratory variability, suggesting right atrial pressure of 3 mmHg. IAS/Shunts: No atrial level shunt detected by color flow Doppler.  LEFT VENTRICLE PLAX 2D LVIDd:         4.48 cm  Diastology LVIDs:         2.98 cm  LV e' medial:    9.25 cm/s LV PW:         0.83 cm  LV E/e' medial:  7.3 LV IVS:        0.81 cm  LV e' lateral:   14.50 cm/s LVOT diam:     2.30 cm  LV E/e' lateral: 4.6 LV SV:         72 LV SV Index:   34 LVOT Area:     4.15 cm  RIGHT VENTRICLE RV Basal diam:  3.98 cm RV S prime:     20.00 cm/s TAPSE (M-mode): 2.6 cm LEFT ATRIUM             Index       RIGHT ATRIUM           Index LA diam:        4.80 cm 2.28 cm/m  RA Area:     13.60 cm LA Vol (A2C):   41.1 ml 19.52 ml/m RA Volume:   33.10 ml  15.72 ml/m LA Vol (A4C):   34.3 ml 16.29 ml/m LA Biplane Vol: 38.3 ml 18.19 ml/m  AORTIC VALVE AV Area (Vmax):    1.68 cm AV Area (Vmean):   1.86 cm AV Area (VTI):     1.98 cm AV Vmax:           231.25 cm/s AV Vmean:          155.000 cm/s AV VTI:            0.362 m AV Peak Grad:      21.4 mmHg AV Mean Grad:      11.0 mmHg LVOT Vmax:         93.25 cm/s  LVOT Vmean:        69.450 cm/s LVOT VTI:          0.172 m LVOT/AV VTI ratio: 0.48  AORTA Ao Root diam: 3.10 cm MITRAL VALVE MV Area (PHT): 3.91 cm    SHUNTS MV Decel Time: 194 msec    Systemic VTI:  0.17 m MV E velocity: 67.30 cm/s  Systemic Diam: 2.30 cm MV A velocity: 86.50 cm/s MV E/A ratio:  0.78 Ida Rogue MD Electronically signed  by Ida Rogue MD Signature Date/Time: 02/18/2021/11:53:16 AM    Final    Medications:  I have reviewed the patient's current medications. Prior to Admission:  Medications Prior to Admission  Medication Sig Dispense Refill Last Dose  . ferrous sulfate 325 (65 FE) MG tablet Take 1 tablet (325 mg total) by mouth 2 (two) times daily with a meal. 60 tablet 3   . vitamin B-12 (CYANOCOBALAMIN) 1000 MCG tablet Take 1 tablet (1,000 mcg total) by mouth daily. 30 tablet 2    Scheduled: . sodium chloride   Intravenous Once  . sodium chloride   Intravenous Once  . Chlorhexidine Gluconate Cloth  6 each Topical Q0600  . insulin aspart  0-9 Units Subcutaneous Q4H  . [START ON 02/20/2021] pantoprazole  40 mg Intravenous Q12H  . thiamine  100 mg Oral Daily   Or  . thiamine  100 mg Intravenous Daily   Continuous: . cefTRIAXone (ROCEPHIN)  IV Stopped (02/17/21 2330)  . octreotide  (SANDOSTATIN)    IV infusion 50 mcg/hr (02/18/21 1125)  . pantoprozole (PROTONIX) infusion 8 mg/hr (02/18/21 0951)  . phenylephrine (NEO-SYNEPHRINE) Adult infusion Stopped (02/17/21 0734)   TIW:[id number], ondansetron **OR** ondansetron (ZOFRAN) IV Anti-infectives (From admission, onward)   Start     Dose/Rate Route Frequency Ordered Stop   02/17/21 2300  cefTRIAXone (ROCEPHIN) 1 g in sodium chloride 0.9 % 100 mL IVPB        1 g 200 mL/hr over 30 Minutes Intravenous Every 24 hours 02/17/21 0256     02/16/21 2230  cefTRIAXone (ROCEPHIN) 1 g in sodium chloride 0.9 % 100 mL IVPB        1 g 200 mL/hr over 30 Minutes Intravenous  Once 02/16/21 2222 02/16/21 2327     Scheduled Meds: . sodium chloride   Intravenous Once  . sodium chloride   Intravenous Once  . Chlorhexidine Gluconate Cloth  6 each Topical Q0600  . insulin aspart  0-9 Units Subcutaneous Q4H  . [START ON 02/20/2021] pantoprazole  40 mg Intravenous Q12H  . thiamine  100 mg Oral Daily   Or  . thiamine  100 mg Intravenous Daily   Continuous Infusions: .  cefTRIAXone (ROCEPHIN)  IV Stopped (02/17/21 2330)  . octreotide  (SANDOSTATIN)    IV infusion 50 mcg/hr (02/18/21 1125)  . pantoprozole (PROTONIX) infusion 8 mg/hr (02/18/21 0951)  . phenylephrine (NEO-SYNEPHRINE) Adult infusion Stopped (02/17/21 0734)   PRN Meds:.diazepam, ondansetron **OR** ondansetron (ZOFRAN) IV   Assessment: Active Problems:   Alcohol use   Upper GI bleed   Anemia   Alcohol abuse   Hyperglycemia   Hypovolemic shock (HCC)   Acute blood loss anemia   Secondary esophageal varices with bleeding (HCC)   Pre-op evaluation   Elevated troponin Decompensated alcoholic cirrhosis with hematemesis and melena, secondary to bleeding from esophageal varices s/p EGD on 3/23, variceal ligation x3  Plan: Hematemesis and melena secondary to variceal bleed Continue octreotide and pantoprazole drip for 72 hours, day 2/3 Continue ceftriaxone  for SBP prophylaxis Okay to discontinue IV fluids Start full liquid diet today Monitor CBC daily, expect melena for next 1 to 2 days Recommend to start nonselective beta-blocker nadolol or propranolol from tomorrow Patient will need repeat EGD in 4 weeks for variceal surveillance and ligation  Decompensated cirrhosis of liver secondary to alcohol use Acute viral hepatitis panel is negative Currently euvolemic Awaiting ultrasound liver with Doppler results Discussed with patient about complete abstinence from alcohol use Strict low-sodium diet, dietary consult placed for counseling Check serum AFP levels No evidence of HRS or PSE Patient should have close follow-up with GI for long-term care of cirrhosis, he can follow-up with me in 3 to 4 weeks after discharge  Patient can be transferred out of ICU today, PT OT and ambulate as tolerated    LOS: 2 days   [name] 02/18/2021, 12:16 PM

## 2021-02-18 NOTE — Progress Notes (Addendum)
Progress Note  Patient Name: Terrence Martin Date of Encounter: 02/18/2021  [facility] Cardiologist: Kate Sable, MD   Subjective   Terrence Martin 7.1/22.1. LA still elevated. AST/ALT elevated and abd [place] this morning. Echo results pending. Patient denies chest pain or SOB. Heart rate improved to the 80s.  Inpatient Medications    Scheduled Meds: . sodium chloride   Intravenous Once  . sodium chloride   Intravenous Once  . Chlorhexidine Gluconate Cloth  6 each Topical Q0600  . insulin aspart  0-9 Units Subcutaneous Q4H  . [START ON 02/20/2021] pantoprazole  40 mg Intravenous Q12H  . thiamine  100 mg Oral Daily   Or  . thiamine  100 mg Intravenous Daily   Continuous Infusions: . sodium chloride 125 mL/hr at 02/18/21 0951  . cefTRIAXone (ROCEPHIN)  IV Stopped (02/17/21 2330)  . octreotide  (SANDOSTATIN)    IV infusion Stopped (02/18/21 0809)  . pantoprozole (PROTONIX) infusion 8 mg/hr (02/18/21 0951)  . phenylephrine (NEO-SYNEPHRINE) Adult infusion Stopped (02/17/21 0734)   PRN Meds: diazepam, ondansetron **OR** ondansetron (ZOFRAN) IV   Vital Signs    Vitals:   02/18/21 0500 02/18/21 0600 02/18/21 0700 02/18/21 0800  BP: 122/61 117/63 113/66 121/60  Pulse:      Resp: (!) 22 18 (!) 21 (!) 21  Temp:    98.9 F (37.2 C)  TempSrc:    Oral  SpO2:    99%  Weight:      Height:        Intake/Output Summary (Last 24 hours) at 02/18/2021 1038 Last data filed at 02/18/2021 0951 Gross per 24 hour  Intake 2681.25 ml  Output 2565 ml  Net 116.25 ml   Last 3 Weights 02/17/2021 02/16/2021 08/24/2020  Weight (lbs) 220 lb 220 lb 234 lb 4.8 oz  Weight (kg) 99.791 kg 99.791 kg 106.278 kg      Telemetry    SR, HR 80s - Personally Reviewed  ECG    No new tracing - Personally Reviewed  Physical Exam   GEN: No acute distress.   Neck: No JVD Cardiac: RRR, + murmur, no rubs, or gallops.  Respiratory: Clear to auscultation bilaterally. GI: Soft, nontender, non-distended  MS: No  edema; No deformity. Neuro:  Nonfocal  Psych: Normal affect   Labs    High Sensitivity Troponin:   Recent Labs  Lab 02/16/21 2105 02/17/21 0646 02/17/21 1507 02/17/21 1654  TROPONINIHS 246* 310* 182* 146*      Chemistry Recent Labs  Lab 02/16/21 2105 02/17/21 0646 02/18/21 0402  NA 137 140 143  K 4.4 4.7 4.4  CL 107 113* 115*  CO2 19* 17* 23  GLUCOSE 274* 174* 163*  BUN 28* 32* 36*  CREATININE 1.11 1.58* 1.24  CALCIUM 8.1* 7.3* 7.6*  PROT  --  4.5* 5.1*  ALBUMIN  --  1.8* 2.2*  AST  --  62* 134*  ALT  --  26 48*  ALKPHOS  --  54 56  BILITOT  --  2.7* 2.9*  GFRNONAA >60 51* >60  ANIONGAP 11 10 5      Hematology Recent Labs  Lab 02/17/21 1654 02/17/21 2200 02/18/21 0402  WBC 10.0 9.1 9.1  RBC 2.59* 2.71* 2.45*  HGB 8.1* 8.4* 7.7*  HCT 23.1* 24.6* 22.1*  MCV 89.2 90.8 90.2  MCH 31.3 31.0 31.4  MCHC 35.1 34.1 34.8  RDW 15.9* 16.2* 16.0*  PLT 75* 67* 67*    BNPNo results for input(s): BNP, PROBNP in the last 168  hours.   DDimer No results for input(s): DDIMER in the last 168 hours.   Radiology    No results found.  Cardiac Studies   Echo results pending  Patient Profile     55 y.o. male with pmh of ETOH abuse, PUD, recurrent GIB, iron deficiency anemia who is being seen for elevated troponin.   Assessment & Plan    Acute anemia/GIB Hemorrhagic shock Lactic acidosis - presented with hematemesis and hematochezia found to have H/H6.6/19.9 requiring multiple PRBCs. Hx of alcohol abuse. - Cardiology asked to see for pre-op assessment however due to urgent nature of EGD nothing was ordered prior to procedure. - Echo ordered for hx of ETOH abuse, results pending - Today H/H 7.7/22.1 - GI following - BP better  Elevated trooponin - HS trop peak at 310  - No anginal symptoms reported, suspect demand ischemia - patient was taken urgently for EGD - Echo results pending - Risk stratify with A1C and lipid panel - No known history of heart  disease - No antiplatelet with anemia  Sinus tachycardia - in the setting of acute GIB - improved  AKI - improving  ETOG abuse - 12 beer daily - cessation recommended, patient plans on quiting  Elevated LFTs - Abd [place]   For questions or updates, please contact [facility] Please consult [email or web address] for contact info under        Signed, Ollis Daudelin Ninfa Meeker, PA-C  02/18/2021, 10:38 AM

## 2021-02-19 ENCOUNTER — Encounter: Payer: Self-pay | Admitting: Gastroenterology

## 2021-02-19 DIAGNOSIS — K7030 Alcoholic cirrhosis of liver without ascites: Secondary | ICD-10-CM

## 2021-02-19 DIAGNOSIS — K7031 Alcoholic cirrhosis of liver with ascites: Principal | ICD-10-CM

## 2021-02-19 LAB — TYPE AND SCREEN
ABO/RH(D): O POS
Antibody Screen: NEGATIVE
Unit division: 0
Unit division: 0
Unit division: 0
Unit division: 0
Unit division: 0
Unit division: 0
Unit division: 0

## 2021-02-19 LAB — BPAM RBC
Blood Product Expiration Date: 202204212359
Blood Product Expiration Date: 202204212359
Blood Product Expiration Date: 202204232359
Blood Product Expiration Date: 202204232359
Blood Product Expiration Date: 202204232359
Blood Product Expiration Date: 202204232359
Blood Product Expiration Date: 202204262359
ISSUE DATE / TIME: 202203230116
ISSUE DATE / TIME: 202203230409
ISSUE DATE / TIME: 202203230932
ISSUE DATE / TIME: 202203231141
Unit Type and Rh: 5100
Unit Type and Rh: 5100
Unit Type and Rh: 5100
Unit Type and Rh: 5100
Unit Type and Rh: 5100
Unit Type and Rh: 5100
Unit Type and Rh: 5100

## 2021-02-19 LAB — CBC
HCT: 22.4 % — ABNORMAL LOW (ref 39.0–52.0)
Hemoglobin: 7.3 g/dL — ABNORMAL LOW (ref 13.0–17.0)
MCH: 31.2 pg (ref 26.0–34.0)
MCHC: 32.6 g/dL (ref 30.0–36.0)
MCV: 95.7 fL (ref 80.0–100.0)
Platelets: 66 10*3/uL — ABNORMAL LOW (ref 150–400)
RBC: 2.34 MIL/uL — ABNORMAL LOW (ref 4.22–5.81)
RDW: 17.1 % — ABNORMAL HIGH (ref 11.5–15.5)
WBC: 10.4 10*3/uL (ref 4.0–10.5)
nRBC: 3 % — ABNORMAL HIGH (ref 0.0–0.2)

## 2021-02-19 LAB — LIPID PANEL
Cholesterol: 120 mg/dL (ref 0–200)
HDL: 17 mg/dL — ABNORMAL LOW (ref >40)
LDL Cholesterol: 82 mg/dL (ref 0–99)
Total CHOL/HDL Ratio: 7.1 RATIO
Triglycerides: 106 mg/dL (ref <150)
VLDL: 21 mg/dL (ref 0–40)

## 2021-02-19 LAB — PREPARE RBC (CROSSMATCH)

## 2021-02-19 LAB — GLUCOSE, CAPILLARY
Glucose-Capillary: 135 mg/dL — ABNORMAL HIGH (ref 70–99)
Glucose-Capillary: 123 mg/dL — ABNORMAL HIGH (ref 70–99)

## 2021-02-19 MED ORDER — NADOLOL 20 MG PO TABS: 20.0000 mg | ORAL_TABLET | Freq: Every day | ORAL | 1 refills | Status: DC

## 2021-02-19 MED ORDER — PANTOPRAZOLE SODIUM 40 MG PO TBEC: 40.0000 mg | DELAYED_RELEASE_TABLET | Freq: Two times a day (BID) | ORAL | 1 refills | Status: DC

## 2021-02-19 MED ORDER — NADOLOL 20 MG PO TABS
20.0000 mg | ORAL_TABLET | Freq: Every day | ORAL | Status: DC
  Administered 2021-02-19: 09:00:00 20 mg via ORAL
  Filled 2021-02-19: qty 1

## 2021-02-19 MED ORDER — DIAZEPAM 5 MG/ML IJ SOLN: 5.0000 mg | Freq: Three times a day (TID) | INTRAMUSCULAR | Status: DC | PRN

## 2021-02-19 NOTE — Progress Notes (Signed)
Initial Nutrition Assessment  DOCUMENTATION CODES:   Obesity unspecified  INTERVENTION:   -D/c Boost Breeze -Ensure Enlive po BID, each supplement provides 350 kcal and 20 grams of protein -MVI with minerals daily -Educated pt on low sodium diet. Attached "Low Sodium Nutrition Therapy" to AVS/ discharge instructions  NUTRITION DIAGNOSIS:   Increased nutrient needs related to chronic illness (cirrhosis) as evidenced by estimated needs.  GOAL:   Patient will meet greater than or equal to 90% of their needs  MONITOR:   PO intake,Supplement acceptance,Diet advancement,Labs,Weight trends,Skin,I & O's  REASON FOR ASSESSMENT:   Consult Diet education  ASSESSMENT:   55 y.o. male with medical history significant of PUD and anemia, alcohol abuse  Pt admitted with upper GIB.   3/23- s/p upper GI endoscopy- revealed severe portal hypertensive gastropathy, 3 large varices with stigmata present in the lower third of esophagus (s/p banding)  Reviewed I/O's: -186 ml x 24 hours and +2.4 L since admission  UOP: 400 ml x 24 hours  Spoke with pt and fiance at bedside. Pt reports that he has had a appetite both currently PTA. He reports consuming about 75% of breakfast this morning. Pt shares that PTA he was consuming one meal per day (fast food) and consumes a 6 pack of beer and 3 wine coolers daily. He shares with this RD his intent to abstain from alcohol in the future.   Per pt, his living situation is difficult now (lives with roommates so he is hesitant about preparing food). Pt fiance reports that they will move into a new apartment in about 2 weeks and plan to cook most of their meals once they move there.   Reviewed wt hx; pt has experienced a 6.1% wt loss over the past 6 months, which is not significant for time frame. Pt denies any recent wt loss  RD provided "Low Sodium Nutrition Therapy" handout from the Academy of [employer]etics. Reviewed patient's dietary recall.  Provided examples on ways to decrease sodium intake in diet. Discouraged intake of processed foods and use of salt shaker. Encouraged fresh fruits and vegetables as well as whole grain sources of carbohydrates to maximize fiber intake.   RD discussed why it is important for patient to adhere to diet recommendations, and emphasized foods to avoid Teach back method used.  Expect fair to good compliance.  Per RN, plan to discharge home today.  Medications reviewed and include thiamine and octreotide.   Labs reviewed: CBGS: 123-209 (inpatient orders for glycemic control are 0-9 units insulin aspart every 4 hours).   NUTRITION - FOCUSED PHYSICAL EXAM:  Flowsheet Row Most Recent Value  Orbital Region No depletion  Upper Arm Region No depletion  Thoracic and Lumbar Region No depletion  Buccal Region No depletion  Temple Region No depletion  Clavicle Bone Region No depletion  Clavicle and Acromion Bone Region No depletion  Scapular Bone Region No depletion  Dorsal Hand No depletion  Patellar Region No depletion  Anterior Thigh Region No depletion  Posterior Calf Region No depletion  Edema (RD Assessment) None  Hair Reviewed  Eyes Reviewed  Mouth Reviewed  Skin Reviewed  Nails Reviewed       Diet Order:   Diet Order            Diet - low sodium heart healthy           Diet full liquid Room service appropriate? Yes; Fluid consistency: Thin  Diet effective now  EDUCATION NEEDS:   Education needs have been addressed  Skin:  Skin Assessment: Reviewed RN Assessment  Last BM:  02/18/21  Height:   Ht Readings from Last 1 Encounters:  02/17/21 5\' 7"  (1.702 m)    Weight:   Wt Readings from Last 1 Encounters:  02/17/21 99.8 kg    Ideal Body Weight:  67.3 kg  BMI:  Body mass index is 34.46 kg/m.  Estimated Nutritional Needs:   Kcal:  2150-2350  Protein:  120-135 grams  Fluid:  > 2 L    Loistine Chance, RD, LDN, [place] Registered Dietitian  II Certified Diabetes Care and Education Specialist Please refer to [facility] for RD and/or RD on-call/weekend/after hours pager

## 2021-02-19 NOTE — Discharge Summary (Signed)
[facility] NAME: Terrence Martin    MR#:  [id number]  DATE OF BIRTH:  02-11-1966  DATE OF ADMISSION:  02/16/2021 ADMITTING PHYSICIAN: Toy Baker, MD  DATE OF DISCHARGE: 02/19/2021  PRIMARY CARE PHYSICIAN: System, Provider Not In    ADMISSION DIAGNOSIS:  Upper GI bleed [K92.2] Gastrointestinal hemorrhage, unspecified gastrointestinal hemorrhage type [K92.2]  DISCHARGE DIAGNOSIS:  Upper GI bleed with Hemorrhagic shock due to Large Variceal bleed in the setting of Alcoholic liver disease  SECONDARY DIAGNOSIS:   Past Medical History:  Diagnosis Date  . GIB (gastrointestinal bleeding)   . IDA (iron deficiency anemia) 08/24/2020  . PUD (peptic ulcer disease)     HOSPITAL COURSE:  Terrence Martin a 55 y.o.malewith medical history significant of PUD and anemia, alcohol abuse Presented withBlack tarry stools since 7 AM and bright red blood in vomit x3 large amount each with large amount of clots EMS was called and gave 400 ml IV fluids He continues to drink3-4beers per day and 12 pack on the weekends.  G.I. bleed/hemorrhagic shock/hypotension/lactic acidosis variceal bleed alcohol abuse/alcoholic liver disease -- continue aggressive IV fluids, pressor support if needed -- patient is status post 4 unit blood transfusion and 3 units FFP -- hemoglobin 6.1--- two unit blood transfusion-- 6.8--- two more units--- 8.5--8.1 -- patient underwent EGD that showed larger varices status post three bands -- IV Protonix for 72 hours--change to po bid -- IV Octreotide for 72 hours--d/ced now -- patient tolerating full liquid diet--advance to soft today -- started on beta-blocker nadolol today per G.I. recs. Ok to d/c later today  Elevated troponin suspect demand ischemia with G.I. bleed and hypotension -- cardiology consultation  with [facility] appreciated. -- monitor troponin 310--146 -- patient has no history of cardiac  disease -- echo of the heart noted EF of 60 to 65%. -- avoid antiplatelet due to G.I. bleed  Alcoholic liver disease suspected cirrhosis --pt advised to abstain form drinking --Hepatin labs c/w alcoholism  Thrombocytopenia secondary to chronic alcoholism -- platelet count 124-- 81 K--67K  Hyperglycemia could be secondary to current illness -- continue sliding scale -- A1c 4.4% -- patient will need close monitoring as outpatient   Overall improving  Procedures: EGD Family communication :none today Consults : G.I., ICU CODE STATUS: full DVT Prophylaxis : SCD Level of care: Med-Surg Status is: Inpatient  Dispo: The patient is from: Home  Anticipated d/c is to: Home  Patient currently is  medically best optimized and stable to d/c.              Difficult to place patient No  CONSULTS OBTAINED:  Treatment Team:  Lin Landsman, MD  DRUG ALLERGIES:  No Known Allergies  DISCHARGE MEDICATIONS:   Allergies as of 02/19/2021   No Known Allergies     Medication List    TAKE these medications   ferrous sulfate 325 (65 FE) MG tablet Take 1 tablet (325 mg total) by mouth 2 (two) times daily with a meal.   nadolol 20 MG tablet Commonly known as: CORGARD Take 1 tablet (20 mg total) by mouth daily. Start taking on: February 20, 2021   pantoprazole 40 MG tablet Commonly known as: Protonix Take 1 tablet (40 mg total) by mouth 2 (two) times daily before a meal.   vitamin B-12 1000 MCG tablet Commonly known as: CYANOCOBALAMIN Take 1 tablet (1,000 mcg total) by mouth daily.       If you experience worsening  of your admission symptoms, develop shortness of breath, life threatening emergency, suicidal or homicidal thoughts you must seek medical attention immediately by calling 911 or calling your MD immediately  if symptoms less severe.  You Must read complete instructions/literature along with all the possible adverse reactions/side  effects for all the Medicines you take and that have been prescribed to you. Take any new Medicines after you have completely understood and accept all the possible adverse reactions/side effects.   Please note  You were cared for by a hospitalist during your hospital stay. If you have any questions about your discharge medications or the care you received while you were in the hospital after you are discharged, you can call the unit and asked to speak with the hospitalist on call if the hospitalist that took care of you is not available. Once you are discharged, your primary care physician will handle any further medical issues. Please note that NO REFILLS for any discharge medications will be authorized once you are discharged, as it is imperative that you return to your primary care physician (or establish a relationship with a primary care physician if you do not have one) for your aftercare needs so that they can reassess your need for medications and monitor your lab values. Today   SUBJECTIVE   No Gi bleed. Tolerating po diet. Eager to try Soft diet  VITAL SIGNS:  Blood pressure (!) 146/82, pulse 77, temperature 98.6 F (37 C), resp. rate 20, height 5\' 7"  (1.702 m), weight 99.8 kg, SpO2 100 %.  I/O:    Intake/Output Summary (Last 24 hours) at 02/19/2021 1030 Last data filed at 02/19/2021 1001 Gross per 24 hour  Intake 603.22 ml  Output --  Net 603.22 ml    PHYSICAL EXAMINATION:  GENERAL:  55 y.o.-year-old patient lying in the bed with no acute distress.  LUNGS: Normal breath sounds bilaterally, no wheezing, rales,rhonchi or crepitation. No use of accessory muscles of respiration.  CARDIOVASCULAR: S1, S2 normal. No murmurs, rubs, or gallops.  ABDOMEN: Soft, non-tender, non-distended. Bowel sounds present. No organomegaly or mass.  EXTREMITIES: No pedal edema, cyanosis, or clubbing.  NEUROLOGIC: Cranial nerves II through XII are intact. Muscle strength 5/5 in all extremities.  Sensation intact. Gait not checked.  PSYCHIATRIC: The patient is alert and oriented x 3.  SKIN: No obvious rash, lesion, or ulcer.   DATA REVIEW:   CBC  Recent Labs  Lab 02/18/21 1207  WBC 12.0*  HGB 8.1*  HCT 23.8*  PLT 67*    Chemistries  Recent Labs  Lab 02/17/21 0646 02/18/21 0402  NA 140 143  K 4.7 4.4  CL 113* 115*  CO2 17* 23  GLUCOSE 174* 163*  BUN 32* 36*  CREATININE 1.58* 1.24  CALCIUM 7.3* 7.6*  MG 1.9  --   AST 62* 134*  ALT 26 48*  ALKPHOS 54 56  BILITOT 2.7* 2.9*    Microbiology Results   Recent Results (from the past 240 hour(s))  Resp Panel by RT-PCR (Flu A&B, Covid) Nasopharyngeal Swab     Status: None   Collection Time: 02/16/21 10:26 PM   Specimen: Nasopharyngeal Swab; Nasopharyngeal(NP) swabs in vial transport medium  Result Value Ref Range Status   SARS Coronavirus 2 by RT PCR NEGATIVE NEGATIVE Final    Comment: (NOTE) SARS-CoV-2 target nucleic acids are NOT DETECTED.  The SARS-CoV-2 RNA is generally detectable in upper respiratory specimens during the acute phase of infection. The lowest concentration of SARS-CoV-2 viral copies this  assay can detect is 138 copies/mL. A negative result does not preclude SARS-Cov-2 infection and should not be used as the sole basis for treatment or other patient management decisions. A negative result may occur with  improper specimen collection/handling, submission of specimen other than nasopharyngeal swab, presence of viral mutation(s) within the areas targeted by this assay, and inadequate number of viral copies(<138 copies/mL). A negative result must be combined with clinical observations, patient history, and epidemiological information. The expected result is Negative.  Fact Sheet for Patients:  EntrepreneurPulse.com.au  Fact Sheet for Healthcare Providers:  IncredibleEmployment.be  This test is no t yet approved or cleared by the [place] FDA and   has been authorized for detection and/or diagnosis of SARS-CoV-2 by FDA under an Emergency Use Authorization (EUA). This EUA will remain  in effect (meaning this test can be used) for the duration of the COVID-19 declaration under Section 564(b)(1) of the Act, 21 U.S.C.section 360bbb-3(b)(1), unless the authorization is terminated  or revoked sooner.       Influenza A by PCR NEGATIVE NEGATIVE Final   Influenza B by PCR NEGATIVE NEGATIVE Final    Comment: (NOTE) The Xpert Xpress SARS-CoV-2/FLU/RSV plus assay is intended as an aid in the diagnosis of influenza from Nasopharyngeal swab specimens and should not be used as a sole basis for treatment. Nasal washings and aspirates are unacceptable for Xpert Xpress SARS-CoV-2/FLU/RSV testing.  Fact Sheet for Patients: EntrepreneurPulse.com.au  Fact Sheet for Healthcare Providers: IncredibleEmployment.be  This test is not yet approved or cleared by the [place] FDA and has been authorized for detection and/or diagnosis of SARS-CoV-2 by FDA under an Emergency Use Authorization (EUA). This EUA will remain in effect (meaning this test can be used) for the duration of the COVID-19 declaration under Section 564(b)(1) of the Act, 21 U.S.C. section 360bbb-3(b)(1), unless the authorization is terminated or revoked.  Performed at [facility], [address] [place], [place] [address]   MRSA PCR Screening     Status: None   Collection Time: 02/17/21  2:01 AM   Specimen: Nasopharyngeal  Result Value Ref Range Status   MRSA by PCR NEGATIVE NEGATIVE Final    Comment:        The GeneXpert MRSA Assay (FDA approved for NASAL specimens only), is one component of a comprehensive MRSA colonization surveillance program. It is not intended to diagnose MRSA infection nor to guide or monitor treatment for MRSA infections. Performed at [facility], [address] [place], [place]  [address]     RADIOLOGY:  ECHOCARDIOGRAM COMPLETE  Result Date: 02/18/2021    ECHOCARDIOGRAM REPORT   Patient Name:   Terrence Martin Date of Exam: 02/17/2021 Medical Rec #:  [id number]   Height:       67.0 in Accession #:    [id number]  Weight:       220.0 lb Date of Birth:  1966-09-11   BSA:          2.106 m Patient Age:    27 years    BP:           98/46 mmHg Patient Gender: M           HR:           93 bpm. Exam Location:  [facility] Procedure: 2D Echo, Cardiac Doppler, Color Doppler and Intracardiac            Opacification Agent Indications:     Elevated Troponin  History:  Patient has no prior history of Echocardiogram examinations.  Sonographer:     Wilford Sports Rodgers-Jones Referring Phys:  [id number] Kate Sable Diagnosing Phys: Ida Rogue MD IMPRESSIONS  1. Left ventricular ejection fraction, by estimation, is 60 to 65%. The left ventricle has normal function. The left ventricle has no regional wall motion abnormalities. Left ventricular diastolic parameters are consistent with Grade I diastolic dysfunction (impaired relaxation).  2. Right ventricular systolic function is normal. The right ventricular size is normal. Tricuspid regurgitation signal is inadequate for assessing PA pressure.  3. Left atrial size was moderately dilated. FINDINGS  Left Ventricle: Left ventricular ejection fraction, by estimation, is 60 to 65%. The left ventricle has normal function. The left ventricle has no regional wall motion abnormalities. Definity contrast agent was given IV to delineate the left ventricular  endocardial borders. The left ventricular internal cavity size was normal in size. There is no left ventricular hypertrophy. Left ventricular diastolic parameters are consistent with Grade I diastolic dysfunction (impaired relaxation). Right Ventricle: The right ventricular size is normal. No increase in right ventricular wall thickness. Right ventricular systolic function is normal. Tricuspid regurgitation signal  is inadequate for assessing PA pressure. Left Atrium: Left atrial size was moderately dilated. Right Atrium: Right atrial size was normal in size. Pericardium: There is no evidence of pericardial effusion. Mitral Valve: The mitral valve is normal in structure. No evidence of mitral valve regurgitation. No evidence of mitral valve stenosis. Tricuspid Valve: The tricuspid valve is normal in structure. Tricuspid valve regurgitation is mild . No evidence of tricuspid stenosis. Aortic Valve: The aortic valve is normal in structure. Aortic valve regurgitation is not visualized. Mild aortic valve sclerosis is present, with no evidence of aortic valve stenosis. Aortic valve mean gradient measures 11.0 mmHg. Aortic valve peak gradient measures 21.4 mmHg. Aortic valve area, by VTI measures 1.98 cm. Pulmonic Valve: The pulmonic valve was normal in structure. Pulmonic valve regurgitation is not visualized. No evidence of pulmonic stenosis. Aorta: The aortic root is normal in size and structure. Venous: The inferior vena cava is normal in size with greater than 50% respiratory variability, suggesting right atrial pressure of 3 mmHg. IAS/Shunts: No atrial level shunt detected by color flow Doppler.  LEFT VENTRICLE PLAX 2D LVIDd:         4.48 cm  Diastology LVIDs:         2.98 cm  LV e' medial:    9.25 cm/s LV PW:         0.83 cm  LV E/e' medial:  7.3 LV IVS:        0.81 cm  LV e' lateral:   14.50 cm/s LVOT diam:     2.30 cm  LV E/e' lateral: 4.6 LV SV:         72 LV SV Index:   34 LVOT Area:     4.15 cm  RIGHT VENTRICLE RV Basal diam:  3.98 cm RV S prime:     20.00 cm/s TAPSE (M-mode): 2.6 cm LEFT ATRIUM             Index       RIGHT ATRIUM           Index LA diam:        4.80 cm 2.28 cm/m  RA Area:     13.60 cm LA Vol (A2C):   41.1 ml 19.52 ml/m RA Volume:   33.10 ml  15.72 ml/m LA Vol (A4C):   34.3 ml 16.29 ml/m LA Biplane  Vol: 38.3 ml 18.19 ml/m  AORTIC VALVE AV Area (Vmax):    1.68 cm AV Area (Vmean):   1.86 cm AV  Area (VTI):     1.98 cm AV Vmax:           231.25 cm/s AV Vmean:          155.000 cm/s AV VTI:            0.362 m AV Peak Grad:      21.4 mmHg AV Mean Grad:      11.0 mmHg LVOT Vmax:         93.25 cm/s LVOT Vmean:        69.450 cm/s LVOT VTI:          0.172 m LVOT/AV VTI ratio: 0.48  AORTA Ao Root diam: 3.10 cm MITRAL VALVE MV Area (PHT): 3.91 cm    SHUNTS MV Decel Time: 194 msec    Systemic VTI:  0.17 m MV E velocity: 67.30 cm/s  Systemic Diam: 2.30 cm MV A velocity: 86.50 cm/s MV E/A ratio:  0.78 Ida Rogue MD Electronically signed by Ida Rogue MD Signature Date/Time: 02/18/2021/11:53:16 AM    Final    US LIVER DOPPLER  Result Date: 02/18/2021 CLINICAL DATA:  Decompensated alcoholic cirrhosis, esophageal varices and variceal bleed. EXAM: DUPLEX ULTRASOUND OF LIVER TECHNIQUE: Color and duplex Doppler ultrasound was performed to evaluate the hepatic in-flow and out-flow vessels. COMPARISON:  Abdominal ultrasound on 09/03/2020 FINDINGS: Portal Vein Velocities Main:  153-238 cm/sec Right:  166 cm/sec Left:  159 cm/sec Hepatic Vein Velocities Right:  25 cm/sec Middle:  36 cm/sec Left:  32 cm/sec Hepatic Artery Velocity:  180 cm/sec Splenic Vein Velocity:  15 cm/sec Varices: No obvious varices. There are some prominent vessels near the splenic hilum. Ascites: Small amount of ascites is identified adjacent to the liver and in the right abdomen. The portal vein demonstrates pulsatile waveforms with flow towards the liver. Pulsatile waveforms can sometimes be secondary to tricuspid regurgitation or right-sided heart failure. No evidence of portal vein thrombus. No evidence of hepatic veno-occlusive disease. Hepatic vein and IVC waveforms appear within normal limits. The liver demonstrates nodular contour consistent with cirrhosis and the liver parenchyma is highly echogenic and heterogeneous. No obvious focal hepatic masses. There does appear to be progression of liver disease since prior ultrasound in 2021.  The spleen is not grossly enlarged. IMPRESSION: 1. No evidence of portal vein thrombus or diminished portal vein velocities. Portal vein velocities are actually elevated with pulsatile waveforms present. Pulsatility can sometimes be secondary to tricuspid regurgitation or right-sided heart failure. 2. Small amount of ascites in the peritoneal cavity. 3. Evidence of cirrhosis with progression of liver disease since the prior ultrasound in 2021. Electronically Signed   By: Aletta Edouard M.D.   On: 02/18/2021 15:58     CODE STATUS:     Code Status Orders  (From admission, onward)         Start     Ordered   02/17/21 0256  Full code  Continuous        02/17/21 0256        Code Status History    This patient has a current code status but no historical code status.   Advance Care Planning Activity       TOTAL TIME TAKING CARE OF THIS PATIENT: *40 minutes.    Fritzi Mandes M.D  [facility]    CC: Primary care physician; System, Provider Not In

## 2021-02-19 NOTE — Progress Notes (Signed)
Gave patient discharge instructions. He has all belongings and is awaiting work note to be sent home. He is aware of all F/U appointments and medication regimen. Bed in low position. All IV sites have been removed

## 2021-02-19 NOTE — TOC Initial Note (Signed)
Transition of Care [facility]) - Initial/Assessment Note    Patient Details  Name: Terrence Martin MRN: [id number] Date of Birth: [birth year]  [facility] CM/SW Contact:    Shelbie Hutching, RN Phone Number: 02/19/2021, 11:31 AM  Clinical Narrative:                 Patient admitted to the hospital with upper GI bleed.  Patient is now medically stable to discharge home.  Patient is independent, works at [employer], drives, and, he lives with his fiance.  Patient does not currently have a PCP but would like to get set up with [facility].  Patient reports that he does have insurance.  RNCM instructed patient that he just needs to call [facility] and ask them which providers are accepting new patient's and get set up with the earliest appointment.   RNCM also provided patient with substance abuse resources as he does endorse heavy alcohol use although he does not feel that he has a problem at this point.    Expected Discharge Plan: Home/Self Care Barriers to Discharge: No Barriers Identified   Patient Goals and CMS Choice        Expected Discharge Plan and Services Expected Discharge Plan: Home/Self Care   Discharge Planning Services: CM Consult   Living arrangements for the past 2 months: Apartment Expected Discharge Date: 02/19/21                                    Prior Living Arrangements/Services Living arrangements for the past 2 months: Apartment Lives with:: Significant Other Patient language and need for interpreter reviewed:: Yes Do you feel safe going back to the place where you live?: Yes      Need for Family Participation in Patient Care: Yes (Comment) Care giver support system in place?: Yes (comment)   Criminal Activity/Legal Involvement Pertinent to Current Situation/Hospitalization: No - Comment as needed  Activities of Daily Living Home Assistive Devices/Equipment: None ADL Screening (condition at time of admission) Patient's cognitive ability adequate to  safely complete daily activities?: Yes Is the patient deaf or have difficulty hearing?: No Does the patient have difficulty seeing, even when wearing glasses/contacts?: No Does the patient have difficulty concentrating, remembering, or making decisions?: No Patient able to express need for assistance with ADLs?: Yes Does the patient have difficulty dressing or bathing?: No Independently performs ADLs?: Yes (appropriate for developmental age) Does the patient have difficulty walking or climbing stairs?: No Weakness of Legs: None Weakness of Arms/Hands: None  Permission Sought/Granted Permission sought to share information with : Case Manager,Family Supports Permission granted to share information with : Yes, Verbal Permission Granted  Share Information with NAME: Marcene Brawn     Permission granted to share info w Relationship: fiance     Emotional Assessment Appearance:: Appears stated age Attitude/Demeanor/Rapport: Engaged Affect (typically observed): Accepting Orientation: : Oriented to Self,Oriented to Place,Oriented to  Time,Oriented to Situation Alcohol / Substance Use: Alcohol Use Psych Involvement: No (comment)  Admission diagnosis:  Upper GI bleed [K92.2] Gastrointestinal hemorrhage, unspecified gastrointestinal hemorrhage type [K92.2] Patient Active Problem List   Diagnosis Date Noted  . Alcoholic cirrhosis of liver without ascites ([place])   . Alcohol abuse 02/17/2021  . Hyperglycemia 02/17/2021  . Hypovolemic shock ([place]) 02/17/2021  . Acute blood loss anemia 02/17/2021  . Secondary esophageal varices with bleeding ([place])   . Pre-op evaluation   . Elevated troponin   .  Upper GI bleed 02/16/2021  . Anemia 02/16/2021  . IDA (iron deficiency anemia) 08/24/2020  . Alcohol use 08/24/2020  . Transaminitis 08/24/2020  . GIB (gastrointestinal bleeding) 06/08/2020   PCP:  System, Provider Not In Pharmacy:   [facility] [address], [place] - [place] [address] Phone: [phone] Fax: [phone]     Social Determinants of Health (SDOH) Interventions    Readmission Risk Interventions No flowsheet data found.

## 2021-02-19 NOTE — Progress Notes (Signed)
   02/19/21 1030  Clinical Encounter Type  Visited With Patient and family together  Visit Type Follow-up  Referral From Nurse  Consult/Referral To [facility] completed one AD for Terrence Martin, room 1C-105.

## 2021-02-19 NOTE — Progress Notes (Signed)
Terrence Darby, MD [address]  [place]  [place], [place] [address]  Main: [phone]  Fax: [phone] Pager: [phone]   Subjective: No acute events overnight.  He denies having any further episodes of melena, he did not have a BM today.  He is tolerating p.o. well.  He is getting ready to go home today  Objective: Vital signs in last 24 hours: Vitals:   02/18/21 2343 02/19/21 0311 02/19/21 0828 02/19/21 1114  BP: (!) 151/78 (!) 167/78 (!) 146/82 129/74  Pulse: 79 79 77 64  Resp: 18 18 20 16   Temp: 98.4 F (36.9 C) (!) 97.5 F (36.4 C) 98.6 F (37 C) 98.6 F (37 C)  TempSrc: Oral Oral Oral   SpO2: 100% 100% 100% 100%  Weight:      Height:       Weight change:   Intake/Output Summary (Last 24 hours) at 02/19/2021 1558 Last data filed at 02/19/2021 1356 Gross per 24 hour  Intake 960 ml  Output -  Net 960 ml     Exam: Heart:: Regular rate and rhythm, S1S2 present or without murmur or extra heart sounds Lungs: normal and clear to auscultation Abdomen: soft, nontender, normal bowel sounds   Lab Results: CBC Latest Ref Rng & Units 02/18/2021 02/18/2021 02/17/2021  WBC 4.0 - 10.5 K/uL 12.0(H) 9.1 9.1  Hemoglobin 13.0 - 17.0 g/dL 8.1(L) 7.7(L) 8.4(L)  Hematocrit 39.0 - 52.0 % 23.8(L) 22.1(L) 24.6(L)  Platelets 150 - 400 K/uL 67(L) 67(L) 67(L)   CMP Latest Ref Rng & Units 02/18/2021 02/17/2021 02/16/2021  Glucose 70 - 99 mg/dL 163(H) 174(H) 274(H)  BUN 6 - 20 mg/dL 36(H) 32(H) 28(H)  Creatinine 0.61 - 1.24 mg/dL 1.24 1.58(H) 1.11  Sodium 135 - 145 mmol/L 143 140 137  Potassium 3.5 - 5.1 mmol/L 4.4 4.7 4.4  Chloride 98 - 111 mmol/L 115(H) 113(H) 107  CO2 22 - 32 mmol/L 23 17(L) 19(L)  Calcium 8.9 - 10.3 mg/dL 7.6(L) 7.3(L) 8.1(L)  Total Protein 6.5 - 8.1 g/dL 5.1(L) 4.5(L) -  Total Bilirubin 0.3 - 1.2 mg/dL 2.9(H) 2.7(H) -  Alkaline Phos 38 - 126 U/L 56 54 -  AST 15 - 41 U/L 134(H) 62(H) -  ALT 0 - 44 U/L 48(H) 26 -    Micro Results: Recent  Results (from the past 240 hour(s))  Resp Panel by RT-PCR (Flu A&B, Covid) Nasopharyngeal Swab     Status: None   Collection Time: 02/16/21 10:26 PM   Specimen: Nasopharyngeal Swab; Nasopharyngeal(NP) swabs in vial transport medium  Result Value Ref Range Status   SARS Coronavirus 2 by RT PCR NEGATIVE NEGATIVE Final    Comment: (NOTE) SARS-CoV-2 target nucleic acids are NOT DETECTED.  The SARS-CoV-2 RNA is generally detectable in upper respiratory specimens during the acute phase of infection. The lowest concentration of SARS-CoV-2 viral copies this assay can detect is 138 copies/mL. A negative result does not preclude SARS-Cov-2 infection and should not be used as the sole basis for treatment or other patient management decisions. A negative result may occur with  improper specimen collection/handling, submission of specimen other than nasopharyngeal swab, presence of viral mutation(s) within the areas targeted by this assay, and inadequate number of viral copies(<138 copies/mL). A negative result must be combined with clinical observations, patient history, and epidemiological information. The expected result is Negative.  Fact Sheet for Patients:  EntrepreneurPulse.com.au  Fact Sheet for Healthcare Providers:  IncredibleEmployment.be  This test is no t yet approved  or cleared by the [place] and  has been authorized for detection and/or diagnosis of SARS-CoV-2 by FDA under an Emergency Use Authorization (EUA). This EUA will remain  in effect (meaning this test can be used) for the duration of the COVID-19 declaration under Section 564(b)(1) of the Act, 21 U.S.C.section 360bbb-3(b)(1), unless the authorization is terminated  or revoked sooner.       Influenza A by PCR NEGATIVE NEGATIVE Final   Influenza B by PCR NEGATIVE NEGATIVE Final    Comment: (NOTE) The Xpert Xpress SARS-CoV-2/FLU/RSV plus assay is intended as an aid in the  diagnosis of influenza from Nasopharyngeal swab specimens and should not be used as a sole basis for treatment. Nasal washings and aspirates are unacceptable for Xpert Xpress SARS-CoV-2/FLU/RSV testing.  Fact Sheet for Patients: EntrepreneurPulse.com.au  Fact Sheet for Healthcare Providers: IncredibleEmployment.be  This test is not yet approved or cleared by the [place] FDA and has been authorized for detection and/or diagnosis of SARS-CoV-2 by FDA under an Emergency Use Authorization (EUA). This EUA will remain in effect (meaning this test can be used) for the duration of the COVID-19 declaration under Section 564(b)(1) of the Act, 21 U.S.C. section 360bbb-3(b)(1), unless the authorization is terminated or revoked.  Performed at [facility], [address] [place], [place] [address]   MRSA PCR Screening     Status: None   Collection Time: 02/17/21  2:01 AM   Specimen: Nasopharyngeal  Result Value Ref Range Status   MRSA by PCR NEGATIVE NEGATIVE Final    Comment:        The GeneXpert MRSA Assay (FDA approved for NASAL specimens only), is one component of a comprehensive MRSA colonization surveillance program. It is not intended to diagnose MRSA infection nor to guide or monitor treatment for MRSA infections. Performed at [facility], [place], [place] [address]    Studies/Results: ECHOCARDIOGRAM COMPLETE  Result Date: 02/18/2021    ECHOCARDIOGRAM REPORT   Patient Name:   Terrence Martin Date of Exam: 02/17/2021 Medical Rec #:  [id number]   Height:       67.0 in Accession #:    [id number]  Weight:       220.0 lb Date of Birth:  Mar 19, 1966   BSA:          2.106 m Patient Age:    55 years    BP:           98/46 mmHg Patient Gender: M           HR:           93 bpm. Exam Location:  [facility] Procedure: 2D Echo, Cardiac Doppler, Color Doppler and Intracardiac            Opacification Agent Indications:      Elevated Troponin  History:         Patient has no prior history of Echocardiogram examinations.  Sonographer:     Wilford Sports Rodgers-Jones Referring Phys:  [id number] Kate Sable Diagnosing Phys: Ida Rogue MD IMPRESSIONS  1. Left ventricular ejection fraction, by estimation, is 60 to 65%. The left ventricle has normal function. The left ventricle has no regional wall motion abnormalities. Left ventricular diastolic parameters are consistent with Grade I diastolic dysfunction (impaired relaxation).  2. Right ventricular systolic function is normal. The right ventricular size is normal. Tricuspid regurgitation signal is inadequate for assessing PA pressure.  3. Left atrial size was moderately dilated. FINDINGS  Left Ventricle: Left  ventricular ejection fraction, by estimation, is 60 to 65%. The left ventricle has normal function. The left ventricle has no regional wall motion abnormalities. Definity contrast agent was given IV to delineate the left ventricular  endocardial borders. The left ventricular internal cavity size was normal in size. There is no left ventricular hypertrophy. Left ventricular diastolic parameters are consistent with Grade I diastolic dysfunction (impaired relaxation). Right Ventricle: The right ventricular size is normal. No increase in right ventricular wall thickness. Right ventricular systolic function is normal. Tricuspid regurgitation signal is inadequate for assessing PA pressure. Left Atrium: Left atrial size was moderately dilated. Right Atrium: Right atrial size was normal in size. Pericardium: There is no evidence of pericardial effusion. Mitral Valve: The mitral valve is normal in structure. No evidence of mitral valve regurgitation. No evidence of mitral valve stenosis. Tricuspid Valve: The tricuspid valve is normal in structure. Tricuspid valve regurgitation is mild . No evidence of tricuspid stenosis. Aortic Valve: The aortic valve is normal in structure. Aortic valve  regurgitation is not visualized. Mild aortic valve sclerosis is present, with no evidence of aortic valve stenosis. Aortic valve mean gradient measures 11.0 mmHg. Aortic valve peak gradient measures 21.4 mmHg. Aortic valve area, by VTI measures 1.98 cm. Pulmonic Valve: The pulmonic valve was normal in structure. Pulmonic valve regurgitation is not visualized. No evidence of pulmonic stenosis. Aorta: The aortic root is normal in size and structure. Venous: The inferior vena cava is normal in size with greater than 50% respiratory variability, suggesting right atrial pressure of 3 mmHg. IAS/Shunts: No atrial level shunt detected by color flow Doppler.  LEFT VENTRICLE PLAX 2D LVIDd:         4.48 cm  Diastology LVIDs:         2.98 cm  LV e' medial:    9.25 cm/s LV PW:         0.83 cm  LV E/e' medial:  7.3 LV IVS:        0.81 cm  LV e' lateral:   14.50 cm/s LVOT diam:     2.30 cm  LV E/e' lateral: 4.6 LV SV:         72 LV SV Index:   34 LVOT Area:     4.15 cm  RIGHT VENTRICLE RV Basal diam:  3.98 cm RV S prime:     20.00 cm/s TAPSE (M-mode): 2.6 cm LEFT ATRIUM             Index       RIGHT ATRIUM           Index LA diam:        4.80 cm 2.28 cm/m  RA Area:     13.60 cm LA Vol (A2C):   41.1 ml 19.52 ml/m RA Volume:   33.10 ml  15.72 ml/m LA Vol (A4C):   34.3 ml 16.29 ml/m LA Biplane Vol: 38.3 ml 18.19 ml/m  AORTIC VALVE AV Area (Vmax):    1.68 cm AV Area (Vmean):   1.86 cm AV Area (VTI):     1.98 cm AV Vmax:           231.25 cm/s AV Vmean:          155.000 cm/s AV VTI:            0.362 m AV Peak Grad:      21.4 mmHg AV Mean Grad:      11.0 mmHg LVOT Vmax:  93.25 cm/s LVOT Vmean:        69.450 cm/s LVOT VTI:          0.172 m LVOT/AV VTI ratio: 0.48  AORTA Ao Root diam: 3.10 cm MITRAL VALVE MV Area (PHT): 3.91 cm    SHUNTS MV Decel Time: 194 msec    Systemic VTI:  0.17 m MV E velocity: 67.30 cm/s  Systemic Diam: 2.30 cm MV A velocity: 86.50 cm/s MV E/A ratio:  0.78 Ida Rogue MD Electronically signed  by Ida Rogue MD Signature Date/Time: 02/18/2021/11:53:16 AM    Final    US LIVER DOPPLER  Result Date: 02/18/2021 CLINICAL DATA:  Decompensated alcoholic cirrhosis, esophageal varices and variceal bleed. EXAM: DUPLEX ULTRASOUND OF LIVER TECHNIQUE: Color and duplex Doppler ultrasound was performed to evaluate the hepatic in-flow and out-flow vessels. COMPARISON:  Abdominal ultrasound on 09/03/2020 FINDINGS: Portal Vein Velocities Main:  153-238 cm/sec Right:  166 cm/sec Left:  159 cm/sec Hepatic Vein Velocities Right:  25 cm/sec Middle:  36 cm/sec Left:  32 cm/sec Hepatic Artery Velocity:  180 cm/sec Splenic Vein Velocity:  15 cm/sec Varices: No obvious varices. There are some prominent vessels near the splenic hilum. Ascites: Small amount of ascites is identified adjacent to the liver and in the right abdomen. The portal vein demonstrates pulsatile waveforms with flow towards the liver. Pulsatile waveforms can sometimes be secondary to tricuspid regurgitation or right-sided heart failure. No evidence of portal vein thrombus. No evidence of hepatic veno-occlusive disease. Hepatic vein and IVC waveforms appear within normal limits. The liver demonstrates nodular contour consistent with cirrhosis and the liver parenchyma is highly echogenic and heterogeneous. No obvious focal hepatic masses. There does appear to be progression of liver disease since prior ultrasound in 2021. The spleen is not grossly enlarged. IMPRESSION: 1. No evidence of portal vein thrombus or diminished portal vein velocities. Portal vein velocities are actually elevated with pulsatile waveforms present. Pulsatility can sometimes be secondary to tricuspid regurgitation or right-sided heart failure. 2. Small amount of ascites in the peritoneal cavity. 3. Evidence of cirrhosis with progression of liver disease since the prior ultrasound in 2021. Electronically Signed   By: Aletta Edouard M.D.   On: 02/18/2021 15:58   Medications:  I have  reviewed the patient's current medications. Prior to Admission:  Medications Prior to Admission  Medication Sig Dispense Refill Last Dose  . ferrous sulfate 325 (65 FE) MG tablet Take 1 tablet (325 mg total) by mouth 2 (two) times daily with a meal. 60 tablet 3   . vitamin B-12 (CYANOCOBALAMIN) 1000 MCG tablet Take 1 tablet (1,000 mcg total) by mouth daily. 30 tablet 2    Scheduled: . Chlorhexidine Gluconate Cloth  6 each Topical Q0600  . feeding supplement  1 Container Oral TID BM  . nadolol  20 mg Oral Daily  . [START ON 02/20/2021] pantoprazole  40 mg Intravenous Q12H  . thiamine  100 mg Oral Daily   Or  . thiamine  100 mg Intravenous Daily   Continuous: . pantoprozole (PROTONIX) infusion Stopped (02/19/21 1420)   [id number], ondansetron **OR** ondansetron (ZOFRAN) IV Anti-infectives (From admission, onward)   Start     Dose/Rate Route Frequency Ordered Stop   02/17/21 2300  cefTRIAXone (ROCEPHIN) 1 g in sodium chloride 0.9 % 100 mL IVPB  Status:  Discontinued        1 g 200 mL/hr over 30 Minutes Intravenous Every 24 hours 02/17/21 0256 02/18/21 1415   02/16/21 2230  cefTRIAXone (ROCEPHIN) 1  g in sodium chloride 0.9 % 100 mL IVPB        1 g 200 mL/hr over 30 Minutes Intravenous  Once 02/16/21 2222 02/16/21 2327     Scheduled Meds: . Chlorhexidine Gluconate Cloth  6 each Topical Q0600  . feeding supplement  1 Container Oral TID BM  . nadolol  20 mg Oral Daily  . [START ON 02/20/2021] pantoprazole  40 mg Intravenous Q12H  . thiamine  100 mg Oral Daily   Or  . thiamine  100 mg Intravenous Daily   Continuous Infusions: . pantoprozole (PROTONIX) infusion Stopped (02/19/21 1420)   PRN Meds:.diazepam, ondansetron **OR** ondansetron (ZOFRAN) IV   Assessment: Active Problems:   Alcohol use   Upper GI bleed   Anemia   Alcohol abuse   Hyperglycemia   Hypovolemic shock (HCC)   Acute blood loss anemia   Secondary esophageal varices with bleeding (HCC)   Pre-op  evaluation   Elevated troponin   Alcoholic cirrhosis of liver without ascites (HCC) Decompensated alcoholic cirrhosis with hematemesis and melena, secondary to bleeding from esophageal varices s/p EGD on 3/23, variceal ligation x3  Plan: Hematemesis and melena secondary to variceal bleed status post EGD with variceal ligation x3 octreotide and pantoprazole drip for 72 hours, day 3/3 Low-sodium diet Started on nadolol 20 mg daily for secondary prophylaxis Patient will need repeat EGD in 4 weeks for variceal surveillance and ligation  Decompensated cirrhosis of liver secondary to alcohol use Acute viral hepatitis panel is negative Currently euvolemic Ultrasound liver with Doppler results did not reveal any portal vein thrombosis Discussed with patient about complete abstinence from alcohol use Strict low-sodium diet, patient is seen by dietitian for low-sodium diet serum AFP levels in process No evidence of HRS or PSE Patient should have close follow-up with GI for long-term care of cirrhosis, he can follow-up with me in 3 to 4 weeks after discharge  Patient being discharged home today    LOS: 3 days   Issabella Rix 02/19/2021, 3:58 PM

## 2021-02-19 NOTE — Discharge Instructions (Signed)
Abstain from drinking alcohol  Low Sodium Nutrition Therapy  Eating less sodium can help you if you have high blood pressure, heart failure, or kidney or liver disease.   Your body needs a little sodium, but too much sodium can cause your body to hold onto extra water. This extra water will raise your blood pressure and can cause damage to your heart, kidneys, or liver as they are forced to work harder.   Sometimes you can see how the extra fluid affects you because your hands, legs, or belly swell. You may also hold water around your heart and lungs, which makes it hard to breathe.   Even if you take medication for blood pressure or a water pill (diuretic) to remove fluid, it is still important to have less salt in your diet.   Check with your primary care provider before drinking alcohol since it may affect the amount of fluid in your body and how your heart, kidneys, or liver work. Sodium in Food A low-sodium meal plan limits the sodium that you get from food and beverages to 1,500-2,000 milligrams (mg) per day. Salt is the main source of sodium. Read the nutrition label on the package to find out how much sodium is in one serving of a food.  . Select foods with 140 milligrams (mg) of sodium or less per serving.  . You may be able to eat one or two servings of foods with a little more than 140 milligrams (mg) of sodium if you are closely watching how much sodium you eat in a day.  . Check the serving size on the label. The amount of sodium listed on the label shows the amount in one serving of the food. So, if you eat more than one serving, you will get more sodium than the amount listed.  Tips Cutting Back on Sodium . Eat more fresh foods.  . Fresh fruits and vegetables are low in sodium, as well as frozen vegetables and fruits that have no added juices or sauces.  . Fresh meats are lower in sodium than processed meats, such as bacon, sausage, and hotdogs.  . Not all processed foods are  unhealthy, but some processed foods may have too much sodium.  . Eat less salt at the table and when cooking. One of the ingredients in salt is sodium.  . One teaspoon of table salt has 2,300 milligrams of sodium.  . Leave the salt out of recipes for pasta, casseroles, and soups. . Be a smart shopper.  . Food packages that say "Salt-free", sodium-free", "very low sodium," and "low sodium" have less than 140 milligrams of sodium per serving.  . Beware of products identified as "Unsalted," "No Salt Added," "Reduced Sodium," or "Lower Sodium." These items may still be high in sodium. You should always check the nutrition label. . Add flavors to your food without adding sodium.  . Try lemon juice, lime juice, or vinegar.  . Dry or fresh herbs add flavor.  Sharyn Lull a sodium-free seasoning blend or make your own at home. . You can purchase salt-free or sodium-free condiments like barbeque sauce in stores and online. Ask your registered dietitian nutritionist for recommendations and where to find them.  .  Eating in Restaurants . Choose foods carefully when you eat outside your home. Restaurant foods can be very high in sodium. Many restaurants provide nutrition facts on their menus or their websites. If you cannot find that information, ask your server. Let your server know  that you want your food to be cooked without salt and that you would like your salad dressing and sauces to be served on the side.  .   . Foods Recommended . Food Group . Foods Recommended  . Grains . Bread, bagels, rolls without salted tops Homemade bread made with reduced-sodium baking powder Cold cereals, especially shredded wheat and puffed rice Oats, grits, or cream of wheat Pastas, quinoa, and rice Popcorn, pretzels or crackers without salt Corn tortillas  . Protein Foods . Fresh meats and fish; [place] bacon (check the nutrition labels - make sure they are not packaged in a sodium solution) Canned or packed tuna (no more  than 4 ounces at 1 serving) Beans and peas Soybeans) and tofu Eggs Nuts or nut butters without salt  . Dairy . Milk or milk powder Plant milks, such as rice and soy Yogurt, including Greek yogurt Small amounts of natural cheese (blocks of cheese) or reduced-sodium cheese can be used in moderation. (Swiss, ricotta, and fresh mozzarella cheese are lower in sodium than the others) Cream Cheese Low sodium cottage cheese  . Vegetables . Fresh and frozen vegetables without added sauces or salt Homemade soups (without salt) Low-sodium, salt-free or sodium-free canned vegetables and soups  . Fruit . Fresh and canned fruits Dried fruits, such as raisins, cranberries, and prunes  . Oils . Tub or liquid margarine, regular or without salt Canola, corn, peanut, olive, safflower, or sunflower oils  . Condiments . Fresh or dried herbs such as basil, bay leaf, dill, mustard (dry), nutmeg, paprika, parsley, rosemary, sage, or thyme.  Low sodium ketchup Vinegar  Lemon or lime juice Pepper, red pepper flakes, and cayenne. Hot sauce contains sodium, but if you use just a drop or two, it will not add up to much.  Salt-free or sodium-free seasoning mixes and marinades Simple salad dressings: vinegar and oil  .  Marland Kitchen Foods Not Recommended . Food Group . Foods Not Recommended  . Grains . Breads or crackers topped with salt Cereals (hot/cold) with more than 300 mg sodium per serving Biscuits, cornbread, and other "quick" breads prepared with baking soda Pre-packaged bread crumbs Seasoned and packaged rice and pasta mixes Self-rising flours  . Protein Foods . Cured meats: Bacon, ham, sausage, pepperoni and hot dogs Canned meats (chili, vienna sausage, or sardines) Smoked fish and meats Frozen meals that have more than 600 mg of sodium per serving Egg substitute (with added sodium)  . Dairy . Buttermilk Processed cheese spreads Cottage cheese (1 cup may have over 500 mg of sodium; look for  low-sodium.) American or feta cheese Shredded Cheese has more sodium than blocks of cheese String cheese  . Vegetables . Canned vegetables (unless they are salt-free, sodium-free or low sodium) Frozen vegetables with seasoning and sauces Sauerkraut and pickled vegetables Canned or dried soups (unless they are salt-free, sodium-free, or low sodium) [place] fries and onion rings  . Fruit . Dried fruits preserved with additives that have sodium  . Oils . Salted butter or margarine, all types of olives  . Condiments . Salt, sea salt, kosher salt, onion salt, and garlic salt Seasoning mixes with salt Bouillon cubes Ketchup Barbeque sauce and Worcestershire sauce unless low sodium Soy sauce Salsa, pickles, olives, relish Salad dressings: ranch, blue cheese, [place], and [place].  .  . Low Sodium Sample 1-Day Menu  . Breakfast . 1 cup cooked oatmeal  . 1 slice whole wheat bread toast  . 1 tablespoon peanut butter without salt  .  1 banana  . 1 cup 1% milk  . Lunch . Tacos made with: 2 corn tortillas  .  cup black beans, low sodium  .  cup roasted or grilled chicken (without skin)  .  avocado  . Squeeze of lime juice  . 1 cup salad greens  . 1 tablespoon low-sodium salad dressing  .  cup strawberries  . 1 orange  . Afternoon Snack . 1/3 cup grapes  . 6 ounces yogurt  . Evening Meal . 3 ounces herb-baked fish  . 1 baked potato  . 2 teaspoons olive oil  .  cup cooked carrots  . 2 thick slices tomatoes on:  . 2 lettuce leaves  . 1 teaspoon olive oil  . 1 teaspoon balsamic vinegar  . 1 cup 1% milk  . Evening Snack . 1 apple  .  cup almonds without salt  .  Marland Kitchen Low-Sodium Vegetarian (Lacto-Ovo) Sample 1-Day Menu  . Breakfast . 1 cup cooked oatmeal  . 1 slice whole wheat toast  . 1 tablespoon peanut butter without salt  . 1 banana  . 1 cup 1% milk  . Lunch . Tacos made with: 2 corn tortillas  .  cup black beans, low sodium  .  cup roasted or grilled chicken (without  skin)  .  avocado  . Squeeze of lime juice  . 1 cup salad greens  . 1 tablespoon low-sodium salad dressing  .  cup strawberries  . 1 orange  . Evening Meal . Stir fry made with:  cup tofu  . 1 cup brown rice  .  cup broccoli  .  cup green beans  .  cup peppers  .  tablespoon peanut oil  . 1 orange  . 1 cup 1% milk  . Evening Snack . 4 strips celery  . 2 tablespoons hummus  . 1 hard-boiled egg  .  Marland Kitchen Low-Sodium Vegan Sample 1-Day Menu  . Breakfast . 1 cup cooked oatmeal  . 1 tablespoon peanut butter without salt  . 1 cup blueberries  . 1 cup soymilk fortified with calcium, vitamin B12, and vitamin D  . Lunch . 1 small whole wheat pita  .  cup cooked lentils  . 2 tablespoons hummus  . 4 carrot sticks  . 1 medium apple  . 1 cup soymilk fortified with calcium, vitamin B12, and vitamin D  . Evening Meal . Stir fry made with:  cup tofu  . 1 cup brown rice  .  cup broccoli  .  cup green beans  .  cup peppers  .  tablespoon peanut oil  . 1 cup cantaloupe  . Evening Snack . 1 cup soy yogurt  .  cup mixed nuts  . Copyright 2020  Academy of Nutrition and Dietetics. All rights reserved .  Marland Kitchen Sodium Free Flavoring Tips .  Marland Kitchen When cooking, the following items may be used for flavoring instead of salt or seasonings that contain sodium. . Remember: A little bit of spice goes a long way! Be careful not to overseason. Marland Kitchen Spice Blend Recipe (makes about ? cup) . 5 teaspoons onion powder  . 2 teaspoons garlic powder  . 2 teaspoons paprika  . 2 teaspoon dry mustard  . 1 teaspoon crushed thyme leaves  .  teaspoon white pepper  .  teaspoon celery seed Food Item Flavorings  Beef Basil, bay leaf, caraway, curry, dill, dry mustard, garlic, grape jelly, green pepper, mace, marjoram, mushrooms (fresh), nutmeg, onion or  onion powder, parsley, pepper, rosemary, sage  Chicken Basil, cloves, cranberries, mace, mushrooms (fresh), nutmeg, oregano, paprika, parsley, pineapple,  saffron, sage, savory, tarragon, thyme, tomato, turmeric  Egg Chervil, curry, dill, dry mustard, garlic or garlic powder, green pepper, jelly, mushrooms (fresh), nutmeg, onion powder, paprika, parsley, rosemary, tarragon, tomato  Fish Basil, bay leaf, chervil, curry, dill, dry mustard, green pepper, lemon juice, marjoram, mushrooms (fresh), paprika, pepper, tarragon, tomato, turmeric  Lamb Cloves, curry, dill, garlic or garlic powder, mace, mint, mint jelly, onion, oregano, parsley, pineapple, rosemary, tarragon, thyme  Pork Applesauce, basil, caraway, chives, cloves, garlic or garlic powder, onion or onion powder, rosemary, thyme  Veal Apricots, basil, bay leaf, currant jelly, curry, ginger, marjoram, mushrooms (fresh), oregano, paprika  Vegetables Basil, dill, garlic or garlic powder, ginger, lemon juice, mace, marjoram, nutmeg, onion or onion powder, tarragon, tomato, sugar or sugar substitute, salt-free salad dressing, vinegar  Desserts Allspice, anise, cinnamon, cloves, ginger, mace, nutmeg, vanilla extract, other extracts   Copyright 2020  Academy of Nutrition and Dietetics. All rights reserved

## 2021-02-20 LAB — PREPARE FRESH FROZEN PLASMA: Unit division: 0

## 2021-02-20 LAB — BPAM FFP
Blood Product Expiration Date: 202203282359
Blood Product Expiration Date: 202203282359
ISSUE DATE / TIME: 202203231433
Unit Type and Rh: 5100
Unit Type and Rh: 5100

## 2021-02-20 LAB — PREPARE RBC (CROSSMATCH)

## 2021-02-20 LAB — AFP TUMOR MARKER: AFP, Serum, Tumor Marker: 4.2 ng/mL (ref 0.0–8.4)

## 2021-02-24 LAB — VITAMIN B1: Vitamin B1 (Thiamine): 94.3 nmol/L (ref 66.5–200.0)

## 2021-03-02 ENCOUNTER — Emergency Department: Payer: BC Managed Care – PPO

## 2021-03-02 ENCOUNTER — Other Ambulatory Visit: Payer: Self-pay

## 2021-03-02 ENCOUNTER — Emergency Department
Admission: EM | Admit: 2021-03-02 | Discharge: 2021-03-02 | Disposition: A | Discharge status: Home / Self Care | Payer: BC Managed Care – PPO | Attending: Emergency Medicine | Admitting: Emergency Medicine

## 2021-03-02 DIAGNOSIS — R770 Abnormality of albumin: Secondary | ICD-10-CM | POA: Diagnosis not present

## 2021-03-02 DIAGNOSIS — R600 Localized edema: Secondary | ICD-10-CM | POA: Diagnosis present

## 2021-03-02 DIAGNOSIS — R609 Edema, unspecified: Secondary | ICD-10-CM

## 2021-03-02 DIAGNOSIS — E8809 Other disorders of plasma-protein metabolism, not elsewhere classified: Secondary | ICD-10-CM

## 2021-03-02 LAB — HEPATIC FUNCTION PANEL
ALT: 41 U/L (ref 0–44)
AST: 78 U/L — ABNORMAL HIGH (ref 15–41)
Albumin: 2.4 g/dL — ABNORMAL LOW (ref 3.5–5.0)
Alkaline Phosphatase: 130 U/L — ABNORMAL HIGH (ref 38–126)
Bilirubin, Direct: 1.2 mg/dL — ABNORMAL HIGH (ref 0.0–0.2)
Indirect Bilirubin: 1.5 mg/dL — ABNORMAL HIGH (ref 0.3–0.9)
Total Bilirubin: 2.7 mg/dL — ABNORMAL HIGH (ref 0.3–1.2)
Total Protein: 6.4 g/dL — ABNORMAL LOW (ref 6.5–8.1)

## 2021-03-02 LAB — CBC
HCT: 25.1 % — ABNORMAL LOW (ref 39.0–52.0)
Hemoglobin: 8.3 g/dL — ABNORMAL LOW (ref 13.0–17.0)
MCH: 30.3 pg (ref 26.0–34.0)
MCHC: 33.1 g/dL (ref 30.0–36.0)
MCV: 91.6 fL (ref 80.0–100.0)
Platelets: 173 10*3/uL (ref 150–400)
RBC: 2.74 MIL/uL — ABNORMAL LOW (ref 4.22–5.81)
RDW: 16.9 % — ABNORMAL HIGH (ref 11.5–15.5)
WBC: 6.4 10*3/uL (ref 4.0–10.5)
nRBC: 0 % (ref 0.0–0.2)

## 2021-03-02 LAB — BASIC METABOLIC PANEL
Anion gap: 5 (ref 5–15)
BUN: 9 mg/dL (ref 6–20)
CO2: 22 mmol/L (ref 22–32)
Calcium: 8.1 mg/dL — ABNORMAL LOW (ref 8.9–10.3)
Chloride: 107 mmol/L (ref 98–111)
Creatinine, Ser: 0.97 mg/dL (ref 0.61–1.24)
GFR, Estimated: >60 mL/min (ref >60)
Glucose, Bld: 112 mg/dL — ABNORMAL HIGH (ref 70–99)
Potassium: 4.3 mmol/L (ref 3.5–5.1)
Sodium: 134 mmol/L — ABNORMAL LOW (ref 135–145)

## 2021-03-02 LAB — TROPONIN I (HIGH SENSITIVITY): Troponin I (High Sensitivity): 6 ng/L (ref <18)

## 2021-03-02 LAB — BRAIN NATRIURETIC PEPTIDE: B Natriuretic Peptide: 60.9 pg/mL (ref 0.0–100.0)

## 2021-03-02 NOTE — ED Notes (Signed)
See triage note, pt reports increased generalized swelling x3 days. Denies shob. Swelling noted to BLE

## 2021-03-02 NOTE — ED Provider Notes (Signed)
[name]  ____________________________________________   Event Date/Time   First MD Initiated Contact with Patient 03/02/21 1427     (approximate)  I have reviewed the triage vital signs and the nursing notes.   HISTORY  Chief Complaint Edema and Shortness of Breath   HPI Terrence Martin is a 55 y.o. male with past medical history of alcohol abuse recently quit after 3/22-3/25 for upper GI bleed secondary to esophageal variceal bleeding status post banding discharged on nadolol iron and Protonix who presents for assessment of 3 days of bilateral lower extremity swelling that has progressively become more proximal up to the mid abdomen by today.  No prior similar episodes.  Patient denies any associated other symptoms including shortness of breath, chest pain, cough, fevers, [place] pain, back pain, diarrhea, constipation, change in urine output, other urinary symptoms, rash, focal extremity weakness numbness pain or any history of CHF.  States he has not had any alcohol since he was discharged and does not use any illegal drugs.  Denies any other acute concerns at this time.         Past Medical History:  Diagnosis Date  . GIB (gastrointestinal bleeding)   . IDA (iron deficiency anemia) 08/24/2020  . PUD (peptic ulcer disease)     Patient Active Problem List   Diagnosis Date Noted  . Alcoholic cirrhosis of liver without ascites ([place])   . Alcohol abuse 02/17/2021  . Hyperglycemia 02/17/2021  . Hypovolemic shock ([place]) 02/17/2021  . Acute blood loss anemia 02/17/2021  . Secondary esophageal varices with bleeding ([place])   . Pre-op evaluation   . Elevated troponin   . Upper GI bleed 02/16/2021  . Anemia 02/16/2021  . IDA (iron deficiency anemia) 08/24/2020  . Alcohol use 08/24/2020  . Transaminitis 08/24/2020  . GIB (gastrointestinal bleeding) 06/08/2020    Past Surgical History:  Procedure Laterality Date  .  ESOPHAGOGASTRODUODENOSCOPY (EGD) WITH PROPOFOL N/A 02/17/2021   Procedure: ESOPHAGOGASTRODUODENOSCOPY (EGD) WITH PROPOFOL;  Surgeon: Lin Landsman, MD;  Location: [place];  Service: Gastroenterology;  Laterality: N/A;    Prior to Admission medications   Medication Sig Start Date End Date Taking? Authorizing Provider  ferrous sulfate 325 (65 FE) MG tablet Take 1 tablet (325 mg total) by mouth 2 (two) times daily with a meal. 08/19/20 12/17/20  Harvest Dark, MD  nadolol (CORGARD) 20 MG tablet Take 1 tablet (20 mg total) by mouth daily. 02/20/21   Fritzi Mandes, MD  pantoprazole (PROTONIX) 40 MG tablet Take 1 tablet (40 mg total) by mouth 2 (two) times daily before a meal. 02/19/21 02/19/22  Fritzi Mandes, MD  vitamin B-12 (CYANOCOBALAMIN) 1000 MCG tablet Take 1 tablet (1,000 mcg total) by mouth daily. 08/24/20   Earlie Server, MD    Allergies Patient has no known allergies.  Family History  Problem Relation Age of Onset  . Lung cancer Mother   . Cancer Father   . Heart attack Brother     Social History Social History   Tobacco Use  . Smoking status: Never Smoker  . Smokeless tobacco: Never Used  Vaping Use  . Vaping Use: Never used  Substance Use Topics  . Alcohol use: Yes    Alcohol/week: 72.0 standard drinks    Types: 72 Cans of beer per week  . Drug use: Never    Review of Systems  Review of Systems  Constitutional: Negative for chills and fever.  HENT: Negative for sore throat.  Eyes: Negative for pain.  Respiratory: Negative for cough and stridor.   Cardiovascular: Negative for chest pain.  Gastrointestinal: Negative for vomiting.  Genitourinary: Negative for dysuria.  Musculoskeletal: Negative for myalgias.  Skin: Negative for rash.  Neurological: Negative for seizures, loss of consciousness and headaches.  Psychiatric/Behavioral: Negative for suicidal ideas.  All other systems reviewed and are negative.      ____________________________________________   PHYSICAL EXAM:  VITAL SIGNS: ED Triage Vitals  Enc Vitals Group     BP 03/02/21 1353 (!) 142/84     Pulse Rate 03/02/21 1353 82     Resp 03/02/21 1353 18     Temp 03/02/21 1354 97.8 F (36.6 C)     Temp Source 03/02/21 1353 Oral     SpO2 03/02/21 1353 100 %     Weight 03/02/21 1354 225 lb (102.1 kg)     Height 03/02/21 1354 5\' 8"  (1.727 m)     Head Circumference --      Peak Flow --      Pain Score 03/02/21 1354 0     Pain Loc --      Pain Edu? --      Excl. in [place]? --    Vitals:   03/02/21 1353 03/02/21 1354  BP: (!) 142/84   Pulse: 82   Resp: 18   Temp:  97.8 F (36.6 C)  SpO2: 100%    Physical Exam Vitals and nursing note reviewed.  Constitutional:      Appearance: He is well-developed.  HENT:     Head: Normocephalic and atraumatic.     Right Ear: External ear normal.     Left Ear: External ear normal.     Nose: Nose normal.  Eyes:     Conjunctiva/sclera: Conjunctivae normal.  Cardiovascular:     Rate and Rhythm: Normal rate and regular rhythm.     Heart sounds: No murmur heard.   Pulmonary:     Effort: Pulmonary effort is normal. No respiratory distress.     Breath sounds: Normal breath sounds.  Abdominal:     Palpations: Abdomen is soft.     Tenderness: There is no abdominal tenderness.  Musculoskeletal:     Cervical back: Neck supple.     Right lower leg: Edema present.     Left lower leg: Edema present.  Skin:    General: Skin is warm and dry.     Capillary Refill: Capillary refill takes less than 2 seconds.  Neurological:     Mental Status: He is alert and oriented to person, place, and time.  Psychiatric:        Mood and Affect: Mood normal.     2+ bilateral radial DP pulses.  Edema does extend up to the mid abdomen.  Sensation is intact light touch of all extremities.  Patient has full strength in his lower extremities and was able to ambulate without any difficulty bearing weight to both  lower extremities.  ____________________________________________   LABS (all labs ordered are listed, but only abnormal results are displayed)  Labs Reviewed  BASIC METABOLIC PANEL - Abnormal; Notable for the following components:      Result Value   Sodium 134 (*)    Glucose, Bld 112 (*)    Calcium 8.1 (*)    All other components within normal limits  CBC - Abnormal; Notable for the following components:   RBC 2.74 (*)    Hemoglobin 8.3 (*)    HCT 25.1 (*)  RDW 16.9 (*)    All other components within normal limits  HEPATIC FUNCTION PANEL - Abnormal; Notable for the following components:   Total Protein 6.4 (*)    Albumin 2.4 (*)    AST 78 (*)    Alkaline Phosphatase 130 (*)    Total Bilirubin 2.7 (*)    Bilirubin, Direct 1.2 (*)    Indirect Bilirubin 1.5 (*)    All other components within normal limits  BRAIN NATRIURETIC PEPTIDE  TROPONIN I (HIGH SENSITIVITY)   ____________________________________________  EKG  Sinus rhythm with a ventricular rate of 85, normal axis, unremarkable intervals and no clear evidence of acute ischemia although some nonspecific change in lead III and aVF without other changes noted.  No significant underlying arrhythmia. ____________________________________________  RADIOLOGY  ED MD interpretation: No evidence of pulmonary edema, effusion, no thorax or any other clear acute thoracic process.  Official radiology report(s): DG Chest 2 View  Result Date: 03/02/2021 CLINICAL DATA:  Shortness of breath EXAM: CHEST - 2 VIEW COMPARISON:  November 09, 2016. FINDINGS: The heart size and mediastinal contours are within normal limits. No focal consolidation or overt pulmonary edema. No pleural effusion. No pneumothorax. Thoracic spondylosis. IMPRESSION: No active cardiopulmonary disease. Electronically Signed   By: Dahlia Bailiff MD   On: 03/02/2021 14:38    ____________________________________________   PROCEDURES  Procedure(s) performed  (including Critical Care):  Procedures   ____________________________________________   INITIAL IMPRESSION / ASSESSMENT AND PLAN / ED COURSE        Patient presents with above-stated history exam for assessment of progressive swelling the last 3 days initially beginning his feet now up to his mid abdomen.  On arrival he is afebrile and hemodynamically stable.  He does appear edematous up to the mid abdomen but his abdomen is soft nontender and he is otherwise neurovascular intact of his bilateral lower extremities.    No history of recent trauma.  Swelling is very symmetric and there is no pain overall very low suspicion for DVT.  No findings on exam to suggest cellulitis.  Patient has no shortness of breath although differential does include CHF versus nephrotic syndrome versus low albumin production from cirrhosis.  Chest x-ray shows no evidence of pulmonary edema and given BNP of 60.9 Evalose patient for heart failure as etiology of patient's lower extremity edema.  CBC shows evidence of anemia with hemoglobin close to baseline as it was 7.311 days ago at 8.3 today without evidence of acute anemia.  Troponin is nonelevated and ECG shows no clear ischemia suspicion for acute ischemic cardiomyopathy.  BMP shows no significant acute or metabolic derangements and kidney function with normal limits.  No evidence of kidney injury at this time.  However hepatic function panel does show some persistent hypoalbuminemia with a recent admission today at 2.4 compared to 2.212 days ago with slightly elevated T bili although downtrending from 2.9-2.7 today.  Suspect patient's edema could certainly be related to some decreased albumin production from cirrhosis given his extensive EtOH history.  However given he is not hypoxic or in any significant distress I believe he is safe for discharge with plan for outpatient PCP and GI follow-up.  Patient discharged stable condition.  Strict return precautions advised  and discussed.  ____________________________________________   FINAL CLINICAL IMPRESSION(S) / ED DIAGNOSES  Final diagnoses:  Edema, unspecified type  Hypoalbuminemia    Medications - No data to display   ED Discharge Orders    None  Note:  This document was prepared using Dragon voice recognition software and may include unintentional dictation errors.   Lucrezia Starch, MD 03/02/21 [phone]

## 2021-03-02 NOTE — ED Triage Notes (Signed)
Pt states he had a procedure to stop esophageal verasies bleeding 02/18/2021. States since he is having increased generalized edema from his chest down into his feet with some SOB.

## 2021-03-08 ENCOUNTER — Other Ambulatory Visit: Payer: Self-pay

## 2021-03-08 ENCOUNTER — Ambulatory Visit (INDEPENDENT_AMBULATORY_CARE_PROVIDER_SITE_OTHER): Payer: BC Managed Care – PPO | Admitting: Cardiology

## 2021-03-08 ENCOUNTER — Encounter: Payer: Self-pay | Admitting: Cardiology

## 2021-03-08 VITALS — BP 136/70 | HR 73 | Ht 68.0 in | Wt 252.0 lb

## 2021-03-08 DIAGNOSIS — R600 Localized edema: Secondary | ICD-10-CM | POA: Diagnosis not present

## 2021-03-08 DIAGNOSIS — K7030 Alcoholic cirrhosis of liver without ascites: Secondary | ICD-10-CM | POA: Diagnosis not present

## 2021-03-08 DIAGNOSIS — R778 Other specified abnormalities of plasma proteins: Secondary | ICD-10-CM | POA: Diagnosis not present

## 2021-03-08 DIAGNOSIS — R011 Cardiac murmur, unspecified: Secondary | ICD-10-CM

## 2021-03-08 MED ORDER — FUROSEMIDE 40 MG PO TABS: 20.0000 mg | ORAL_TABLET | Freq: Every day | ORAL | 3 refills | Status: DC

## 2021-03-08 NOTE — Progress Notes (Signed)
Cardiology Office Note:    Date:  03/08/2021   ID:  Terrence Martin, DOB [birth year], MRN [id number]  PCP:  Pcp, No   [place]  Cardiologist:  Kate Sable, MD  Advanced Practice Provider:  No care team member to display Electrophysiologist:  None       Referring MD: No ref. provider found   Chief Complaint  Patient presents with  . Other    [place] follow up. Patient c.o being swollen from Abdomen to knees with worse being his private areas. Meds reviewed verbally with patient.     History of Present Illness:    Terrence Martin is a 55 y.o. male with a hx of alcohol abuse, cirrhosis GI bleed s/p gastric banding, who presents for follow-up.  Patient initially seen in the [facility] on 02/17/2021 after being admitted for blood in stool.  Hemoglobin in the hospital was as low as 6.6.  Underwent EGD with esophageal varices noted s/p gastric banding.  Ultrasound of the liver obtained 01/2021 showed evidence of cirrhosis with progression since prior in 2021.  Troponins were noted to be elevated, more consistent with demand supply mismatch.  Echocardiogram obtained on 02/17/2021 showed normal systolic function, EF 60 to 65%.  Troponins obtained 6 days ago was normal.  Currently on iron, Protonix and nadolol.  Has not had a drink over the past 3 weeks.  States having abdominal distention, lower extremity edema.  Past Medical History:  Diagnosis Date  . GIB (gastrointestinal bleeding)   . IDA (iron deficiency anemia) 08/24/2020  . PUD (peptic ulcer disease)     Past Surgical History:  Procedure Laterality Date  . ESOPHAGOGASTRODUODENOSCOPY (EGD) WITH PROPOFOL N/A 02/17/2021   Procedure: ESOPHAGOGASTRODUODENOSCOPY (EGD) WITH PROPOFOL;  Surgeon: Lin Landsman, MD;  Location: [facility];  Service: Gastroenterology;  Laterality: N/A;    Current Medications: Current Meds  Medication Sig  . ferrous sulfate 325 (65 FE) MG tablet Take 1 tablet (325 mg total)  by mouth 2 (two) times daily with a meal.  . furosemide (LASIX) 40 MG tablet Take 0.5 tablets (20 mg total) by mouth daily.  . nadolol (CORGARD) 20 MG tablet Take 1 tablet (20 mg total) by mouth daily.  . pantoprazole (PROTONIX) 40 MG tablet Take 1 tablet (40 mg total) by mouth 2 (two) times daily before a meal.  . vitamin B-12 (CYANOCOBALAMIN) 1000 MCG tablet Take 1 tablet (1,000 mcg total) by mouth daily.     Allergies:   Patient has no known allergies.   Social History   Socioeconomic History  . Marital status: Single    Spouse name: Not on file  . Number of children: Not on file  . Years of education: Not on file  . Highest education level: Not on file  Occupational History  . Not on file  Tobacco Use  . Smoking status: Never Smoker  . Smokeless tobacco: Never Used  Vaping Use  . Vaping Use: Never used  Substance and Sexual Activity  . Alcohol use: Yes    Alcohol/week: 72.0 standard drinks    Types: 72 Cans of beer per week  . Drug use: Never  . Sexual activity: Not on file  Other Topics Concern  . Not on file  Social History Narrative   Lives locally.  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  Does not routinely exercise.   Social Determinants of Health   Financial Resource Strain: Not on file  Food Insecurity: Not on file  Transportation Needs: Not on  file  Physical Activity: Not on file  Stress: Not on file  Social Connections: Not on file     Family History: The patient's family history includes Cancer in his father; Heart attack in his brother; Lung cancer in his mother.  ROS:   Please see the history of present illness.     All other systems reviewed and are negative.  EKGs/Labs/Other Studies Reviewed:    The following studies were reviewed today:   EKG:  EKG is  ordered today.  The ekg ordered today demonstrates normal sinus rhythm, normal ECG.  Recent Labs: 02/17/2021: Magnesium 1.9; TSH 0.870 03/02/2021: ALT 41; B Natriuretic Peptide 60.9; BUN 9; Creatinine, Ser  0.97; Hemoglobin 8.3; Platelets 173; Potassium 4.3; Sodium 134  Recent Lipid Panel    Component Value Date/Time   CHOL 120 02/19/2021 0524   TRIG 106 02/19/2021 0524   HDL 17 (L) 02/19/2021 0524   CHOLHDL 7.1 02/19/2021 0524   VLDL 21 02/19/2021 0524   LDLCALC 82 02/19/2021 0524     Risk Assessment/Calculations:      Physical Exam:    VS:  BP 136/70 (BP Location: Left Arm, Patient Position: Sitting, Cuff Size: Normal)   Pulse 73   Ht 5\' 8"  (1.727 m)   Wt 252 lb (114.3 kg)   SpO2 99%   BMI 38.32 kg/m     Wt Readings from Last 3 Encounters:  03/08/21 252 lb (114.3 kg)  03/02/21 225 lb (102.1 kg)  02/17/21 220 lb (99.8 kg)     GEN:  Well nourished, well developed in no acute distress HEENT: Normal NECK: No JVD; No carotid bruits LYMPHATICS: No lymphadenopathy CARDIAC: RRR, 2/6 systolic murmur, rubs, gallops RESPIRATORY:  Clear to auscultation without rales, wheezing or rhonchi  ABDOMEN: Soft, non-tender, distended MUSCULOSKELETAL:  No edema; No deformity  SKIN: Warm and dry NEUROLOGIC:  Alert and oriented x 3 PSYCHIATRIC:  Normal affect   ASSESSMENT:    1. Elevated troponin   2. Alcoholic cirrhosis, unspecified whether ascites present (HCC)   3. Leg edema   4. Systolic murmur    PLAN:    In order of problems listed above:  1. Elevated troponins, secondary to demand supply mismatch.  Last troponins 6 days ago was normal, echocardiogram 01/2021 showed preserved ejection fraction, no wall motion abnormalities.  Denies chest pain, stress test not indicated at this time. 2. Cirrhosis, keep appointment with gastroenterology.  On nadolol, Protonix. 3. Lower extremity edema, abdominal distention.  Likely secondary to hypoalbuminemia, cirrhosis.  BNP 6 days ago was normal.  Start Lasix 40 mg daily.  Check potassium in 2 weeks, replete as needed. 4. Systolic murmur noted on exam, echo with preserved ejection fraction, aortic sclerosis which is likely etiology for  murmur.  Follow-up in 3 months.     Medication Adjustments/Labs and Tests Ordered: Current medicines are reviewed at length with the patient today.  Concerns regarding medicines are outlined above.  Orders Placed This Encounter  Procedures  . Basic metabolic panel  . EKG 12-Lead   Meds ordered this encounter  Medications  . furosemide (LASIX) 40 MG tablet    Sig: Take 0.5 tablets (20 mg total) by mouth daily.    Dispense:  30 tablet    Refill:  3    Patient Instructions  Medication Instructions:  Your physician has recommended you make the following change in your medication:   1.  START taking Lasix (Furosemide) 40 MG once daily.  *If you need a refill on  your cardiac medications before your next appointment, please call your pharmacy*   Lab Work: Your physician recommends that you return for lab work in: In one week.  - Please go to the [facility]. You will check in at the front desk to the right as you walk into the atrium. Valet Parking is offered if needed. - No appointment needed. You may go any day between 7 am and 6 pm.    Testing/Procedures: None ordered   Follow-Up: At [facility], you and your health needs are our priority.  As part of our continuing mission to provide you with exceptional heart care, we have created designated Provider Care Teams.  These Care Teams include your primary Cardiologist (physician) and Advanced Practice Providers (APPs -  Physician Assistants and Nurse Practitioners) who all work together to provide you with the care you need, when you need it.  We recommend signing up for the patient portal called "MyChart".  Sign up information is provided on this After Visit Summary.  MyChart is used to connect with patients for Virtual Visits (Telemedicine).  Patients are able to view lab/test results, encounter notes, upcoming appointments, etc.  Non-urgent messages can be sent to your provider as well.   To learn more about what  you can do with MyChart, go to [email or web address].    Your next appointment:   3 month(s)  The format for your next appointment:   In Person  Provider:   Kate Sable, MD   Other Instructions      Signed, Kate Sable, MD  03/08/2021 5:04 PM    [place]

## 2021-03-08 NOTE — Patient Instructions (Signed)
Medication Instructions:  Your physician has recommended you make the following change in your medication:   1.  START taking Lasix (Furosemide) 40 MG once daily.  *If you need a refill on your cardiac medications before your next appointment, please call your pharmacy*   Lab Work: Your physician recommends that you return for lab work in: In one week.  - Please go to the [facility]. You will check in at the front desk to the right as you walk into the atrium. Valet Parking is offered if needed. - No appointment needed. You may go any day between 7 am and 6 pm.    Testing/Procedures: None ordered   Follow-Up: At [facility], you and your health needs are our priority.  As part of our continuing mission to provide you with exceptional heart care, we have created designated Provider Care Teams.  These Care Teams include your primary Cardiologist (physician) and Advanced Practice Providers (APPs -  Physician Assistants and Nurse Practitioners) who all work together to provide you with the care you need, when you need it.  We recommend signing up for the patient portal called "MyChart".  Sign up information is provided on this After Visit Summary.  MyChart is used to connect with patients for Virtual Visits (Telemedicine).  Patients are able to view lab/test results, encounter notes, upcoming appointments, etc.  Non-urgent messages can be sent to your provider as well.   To learn more about what you can do with MyChart, go to [email or web address].    Your next appointment:   3 month(s)  The format for your next appointment:   In Person  Provider:   Kate Sable, MD   Other Instructions

## 2021-03-12 ENCOUNTER — Telehealth: Payer: Self-pay | Admitting: Cardiology

## 2021-03-12 NOTE — Telephone Encounter (Signed)
Called patient back and the phone disconnected on me.

## 2021-03-12 NOTE — Telephone Encounter (Signed)
Pt c/o swelling: STAT is pt has developed SOB within 24 hours  1) How much weight have you gained and in what time span? Swelling has been going on since 3/25 after hospital stay   2) If swelling, where is the swelling located? In feet, ankles, legs and stomach  3) Are you currently taking a fluid pill? Yes, started on 4/11. Lasix 40 mg once daily  4) Are you currently SOB? No   5) Do you have a log of your daily weights (if so, list)? no  6) Have you gained 3 pounds in a day or 5 pounds in a week? states "he doesn't know"  7) Have you traveled recently? No  Patient was recently seen in office 4/11

## 2021-03-16 ENCOUNTER — Other Ambulatory Visit: Payer: Self-pay

## 2021-03-16 ENCOUNTER — Ambulatory Visit (INDEPENDENT_AMBULATORY_CARE_PROVIDER_SITE_OTHER): Payer: BC Managed Care – PPO | Admitting: Gastroenterology

## 2021-03-16 ENCOUNTER — Encounter: Payer: Self-pay | Admitting: Gastroenterology

## 2021-03-16 VITALS — BP 144/83 | HR 87 | Temp 98.1°F | Ht 68.0 in | Wt 245.0 lb

## 2021-03-16 DIAGNOSIS — K7290 Hepatic failure, unspecified without coma: Secondary | ICD-10-CM | POA: Diagnosis not present

## 2021-03-16 DIAGNOSIS — Z23 Encounter for immunization: Secondary | ICD-10-CM

## 2021-03-16 DIAGNOSIS — K7460 Unspecified cirrhosis of liver: Secondary | ICD-10-CM | POA: Diagnosis not present

## 2021-03-16 DIAGNOSIS — I8500 Esophageal varices without bleeding: Secondary | ICD-10-CM

## 2021-03-16 DIAGNOSIS — I8511 Secondary esophageal varices with bleeding: Secondary | ICD-10-CM | POA: Diagnosis not present

## 2021-03-16 DIAGNOSIS — Z1211 Encounter for screening for malignant neoplasm of colon: Secondary | ICD-10-CM

## 2021-03-16 MED ORDER — SPIRONOLACTONE 50 MG PO TABS: 150.0000 mg | ORAL_TABLET | Freq: Every day | ORAL | 2 refills | Status: DC

## 2021-03-16 MED ORDER — NADOLOL 20 MG PO TABS: 20.0000 mg | ORAL_TABLET | Freq: Every day | ORAL | 2 refills | Status: AC

## 2021-03-16 MED ORDER — FUROSEMIDE 20 MG PO TABS: 60.0000 mg | ORAL_TABLET | Freq: Every day | ORAL | 2 refills | Status: DC

## 2021-03-16 MED ORDER — SHINGRIX 50 MCG/0.5ML IM SUSR: 0.5000 mL | Freq: Once | INTRAMUSCULAR | 0 refills | Status: AC

## 2021-03-16 MED ORDER — NA SULFATE-K SULFATE-MG SULF 17.5-3.13-1.6 GM/177ML PO SOLN: 354.0000 mL | Freq: Once | ORAL | 0 refills | Status: AC

## 2021-03-16 NOTE — Patient Instructions (Addendum)
1. Repeat lab in 1 week. Can come to our office or any [facility] facility. Our lab is open Tuesday 8:30-4:pm, Wednesday 8:30am to 4:00pm and Thursday 1:00pm to 4:00pm.  2. Sent Shingrix vaccine to the pharmacy and medications  3. Come back in 4 weeks for a nurse visit for 2nd HepA and Hep B  4. Start Lasix/furosemide 60 mg daily and spironolactone 150 mg daily.  If your swelling has significantly decreased in next 1 to 2 weeks, then decrease Lasix to 40 mg daily and spironolactone to 100 mg daily 5. Start nadolol 20 mg daily  Terrence Darby, MD [address]  [place]  [place], [place] [address]  Main: [phone]  Fax: [phone] Pager: [phone]  Low-Sodium Eating Plan Sodium, which is an element that makes up salt, helps you maintain a healthy balance of fluids in your body. Too much sodium can increase your blood pressure and cause fluid and waste to be held in your body. Your health care provider or dietitian may recommend following this plan if you have high blood pressure (hypertension), kidney disease, liver disease, or heart failure. Eating less sodium can help lower your blood pressure, reduce swelling, and protect your heart, liver, and kidneys. What are tips for following this plan? Reading food labels  The Nutrition Facts label lists the amount of sodium in one serving of the food. If you eat more than one serving, you must multiply the listed amount of sodium by the number of servings.  Choose foods with less than 140 mg of sodium per serving.  Avoid foods with 300 mg of sodium or more per serving. Shopping  Look for lower-sodium products, often labeled as "low-sodium" or "no salt added."  Always check the sodium content, even if foods are labeled as "unsalted" or "no salt added."  Buy fresh foods. ? Avoid canned foods and pre-made or frozen meals. ? Avoid canned, cured, or processed meats.  Buy breads that have less than 80 mg of sodium per slice.    Cooking  Eat more home-cooked food and less restaurant, buffet, and fast food.  Avoid adding salt when cooking. Use salt-free seasonings or herbs instead of table salt or sea salt. Check with your health care provider or pharmacist before using salt substitutes.  Cook with plant-based oils, such as canola, sunflower, or olive oil.   Meal planning  When eating at a restaurant, ask that your food be prepared with less salt or no salt, if possible. Avoid dishes labeled as brined, pickled, cured, smoked, or made with soy sauce, miso, or teriyaki sauce.  Avoid foods that contain MSG (monosodium glutamate). MSG is sometimes added to [place] food, bouillon, and some canned foods.  Make meals that can be grilled, baked, poached, roasted, or steamed. These are generally made with less sodium. General information Most people on this plan should limit their sodium intake to 1,500-2,000 mg (milligrams) of sodium each day. What foods should I eat? Fruits Fresh, frozen, or canned fruit. Fruit juice. Vegetables Fresh or frozen vegetables. "No salt added" canned vegetables. "No salt added" tomato sauce and paste. Low-sodium or reduced-sodium tomato and vegetable juice. Grains Low-sodium cereals, including oats, puffed wheat and rice, and shredded wheat. Low-sodium crackers. Unsalted rice. Unsalted pasta. Low-sodium bread. Whole-grain breads and whole-grain pasta. Meats and other proteins Fresh or frozen (no salt added) meat, poultry, seafood, and fish. Low-sodium canned tuna and salmon. Unsalted nuts. Dried peas, beans, and lentils without added salt. Unsalted canned beans. Eggs.  Unsalted nut butters. Dairy Milk. Soy milk. Cheese that is naturally low in sodium, such as ricotta cheese, fresh mozzarella, or Swiss cheese. Low-sodium or reduced-sodium cheese. Cream cheese. Yogurt. Seasonings and condiments Fresh and dried herbs and spices. Salt-free seasonings. Low-sodium mustard and ketchup. Sodium-free  salad dressing. Sodium-free light mayonnaise. Fresh or refrigerated horseradish. Lemon juice. Vinegar. Other foods Homemade, reduced-sodium, or low-sodium soups. Unsalted popcorn and pretzels. Low-salt or salt-free chips. The items listed above may not be a complete list of foods and beverages you can eat. Contact a dietitian for more information. What foods should I avoid? Vegetables Sauerkraut, pickled vegetables, and relishes. Olives. [place] fries. Onion rings. Regular canned vegetables (not low-sodium or reduced-sodium). Regular canned tomato sauce and paste (not low-sodium or reduced-sodium). Regular tomato and vegetable juice (not low-sodium or reduced-sodium). Frozen vegetables in sauces. Grains Instant hot cereals. Bread stuffing, pancake, and biscuit mixes. Croutons. Seasoned rice or pasta mixes. Noodle soup cups. Boxed or frozen macaroni and cheese. Regular salted crackers. Self-rising flour. Meats and other proteins Meat or fish that is salted, canned, smoked, spiced, or pickled. Precooked or cured meat, such as sausages or meat loaves. Berniece Salines. Ham. Pepperoni. Hot dogs. Corned beef. Chipped beef. Salt pork. Jerky. Pickled herring. Anchovies and sardines. Regular canned tuna. Salted nuts. Dairy Processed cheese and cheese spreads. Hard cheeses. Cheese curds. Blue cheese. Feta cheese. String cheese. Regular cottage cheese. Buttermilk. Canned milk. Fats and oils Salted butter. Regular margarine. Ghee. Bacon fat. Seasonings and condiments Onion salt, garlic salt, seasoned salt, table salt, and sea salt. Canned and packaged gravies. Worcestershire sauce. Tartar sauce. Barbecue sauce. Teriyaki sauce. Soy sauce, including reduced-sodium. Steak sauce. Fish sauce. Oyster sauce. Cocktail sauce. Horseradish that you find on the shelf. Regular ketchup and mustard. Meat flavorings and tenderizers. Bouillon cubes. Hot sauce. Pre-made or packaged marinades. Pre-made or packaged taco seasonings. Relishes.  Regular salad dressings. Salsa. Other foods Salted popcorn and pretzels. Corn chips and puffs. Potato and tortilla chips. Canned or dried soups. Pizza. Frozen entrees and pot pies. The items listed above may not be a complete list of foods and beverages you should avoid. Contact a dietitian for more information. Summary  Eating less sodium can help lower your blood pressure, reduce swelling, and protect your heart, liver, and kidneys.  Most people on this plan should limit their sodium intake to 1,500-2,000 mg (milligrams) of sodium each day.  Canned, boxed, and frozen foods are high in sodium. Restaurant foods, fast foods, and pizza are also very high in sodium. You also get sodium by adding salt to food.  Try to cook at home, eat more fresh fruits and vegetables, and eat less fast food and canned, processed, or prepared foods. This information is not intended to replace advice given to you by your health care provider. Make sure you discuss any questions you have with your health care provider. Document Revised: 12/20/2019 Document Reviewed: 10/16/2019 Elsevier Patient Education  2021 [employer].

## 2021-03-16 NOTE — Progress Notes (Signed)
Cephas Darby, MD [address]  [place]  [place], [place] [address]  Main: [phone]  Fax: [phone]    Gastroenterology Consultation  Referring Provider:     No ref. provider found Primary Care Physician:  Pcp, No Primary Gastroenterologist:  Dr. Cephas Darby Reason for Consultation:     Decompensated alcoholic cirrhosis        HPI:   Terrence Martin is a 55 y.o. male referred by Dr. Merryl Hacker, No  for consultation & management of decompensated alcoholic cirrhosis.  Patient is admitted to [facility] on 02/17/2021 secondary to hematemesis, hemorrhagic shock, acute blood loss anemia.  He is found to have decompensated cirrhosis, requiring pressor support, was medically treated appropriately, underwent upper endoscopy, found to have large esophageal varices and 3 bands were placed.  He did have severe portal hypertensive gastropathy.  Patient was discharged on nadolol, apparently he said he took it for 3 days postdischarge.  He went to the ER since discharge due to swelling of legs from lower abdomen below down to feet, symmetric.  He was seen by cardiology, started on Lasix 40 mg daily.  Patient reports that the swelling has more or less remained the same.  He reports that he has received counseling for low-sodium diet when he was hospitalized.  He denies any further episodes of melena, hematemesis, rectal bleeding.  He denies any abdominal pain.  His main concern today is swelling of legs including scrotal edema as well as abdominal swelling.  He reports that he did not drink alcohol since discharge. Patient's hemoglobin since discharge improved, 8.3, platelets 173 He works as a Librarian, academic at [employer]  NSAIDs: None  Antiplts/Anticoagulants/Anti thrombotics: None  GI Procedures: EGD 02/17/2021 - Normal duodenal bulb and second portion of the duodenum. - Portal hypertensive gastropathy. - Normal gastric body, incisura and antrum. - Large (> 5 mm) esophageal varices with stigmata of recent  bleeding. Incompletely eradicated. Banded x 3. - No specimens collected.  Past Medical History:  Diagnosis Date  . GIB (gastrointestinal bleeding)   . IDA (iron deficiency anemia) 08/24/2020  . PUD (peptic ulcer disease)     Past Surgical History:  Procedure Laterality Date  . ESOPHAGOGASTRODUODENOSCOPY (EGD) WITH PROPOFOL N/A 02/17/2021   Procedure: ESOPHAGOGASTRODUODENOSCOPY (EGD) WITH PROPOFOL;  Surgeon: Lin Landsman, MD;  Location: [place];  Service: Gastroenterology;  Laterality: N/A;    Current Outpatient Medications:  .  furosemide (LASIX) 20 MG tablet, Take 3 tablets (60 mg total) by mouth daily., Disp: 90 tablet, Rfl: 2 .  Na Sulfate-K Sulfate-Mg Sulf 17.5-3.13-1.6 GM/177ML SOLN, Take 354 mLs by mouth once for 1 dose., Disp: 354 mL, Rfl: 0 .  nadolol (CORGARD) 20 MG tablet, Take 1 tablet (20 mg total) by mouth daily., Disp: 30 tablet, Rfl: 2 .  spironolactone (ALDACTONE) 50 MG tablet, Take 3 tablets (150 mg total) by mouth daily., Disp: 90 tablet, Rfl: 2 .  Zoster Vaccine Adjuvanted [facility]) injection, Inject 0.5 mLs into the muscle once for 1 dose., Disp: 0.5 mL, Rfl: 0   Family History  Problem Relation Age of Onset  . Lung cancer Mother   . Cancer Father   . Heart attack Brother      Social History   Tobacco Use  . Smoking status: Never Smoker  . Smokeless tobacco: Never Used  Vaping Use  . Vaping Use: Never used  Substance Use Topics  . Alcohol use: Not Currently    Alcohol/week: 72.0 standard drinks    Types:  72 Cans of beer per week  . Drug use: Never    Allergies as of 03/16/2021  . (No Known Allergies)    Review of Systems:    All systems reviewed and negative except where noted in HPI.   Physical Exam:  BP (!) 144/83 (BP Location: Left Arm, Patient Position: Sitting, Cuff Size: Normal)   Pulse 87   Temp 98.1 F (36.7 C) (Oral)   Ht 5\' 8"  (1.727 m)   Wt 245 lb (111.1 kg)   BMI 37.25 kg/m  No LMP for male patient.  General:    Alert,  Well-developed, well-nourished, pleasant and cooperative in NAD Head:  Normocephalic and atraumatic. Eyes:  Sclera clear, no icterus.   Conjunctiva pink. Ears:  Normal auditory acuity. Nose:  No deformity, discharge, or lesions. Mouth:  No deformity or lesions,oropharynx pink & moist. Neck:  Supple; no masses or thyromegaly. Lungs:  Respirations even and unlabored.  Clear throughout to auscultation.   No wheezes, crackles, or rhonchi. No acute distress. Heart:  Regular rate and rhythm; no murmurs, clicks, rubs, or gallops. Abdomen:  Normal bowel sounds. Soft, non-tender and non-distended without masses, hepatosplenomegaly or hernias noted.  No guarding or rebound tenderness.   Rectal: Not performed Msk:  Symmetrical without gross deformities. Good, equal movement & strength bilaterally. Pulses:  Normal pulses noted. Extremities:  clubbing and 3+ edema.  No cyanosis. Neurologic:  Alert and oriented x3;  grossly normal neurologically. Skin:  Intact without significant lesions or rashes. No jaundice. Psych:  Alert and cooperative. Normal mood and affect.  Imaging Studies: Reviewed  Assessment and Plan:   Terrence Martin is a 55 y.o. male with decompensated alcoholic cirrhosis with esophageal variceal bleed s/p EGD 3/23, variceal ligation x3  Esophageal varices Recommend repeat EGD for variceal surveillance and ligation if needed Restart nadolol 20 mg daily Ultrasound Dopplers did not reveal portal vein thrombosis Recheck CBC, PT/INR today  Decompensated alcoholic cirrhosis Viral hepatitis panel negative, recommend Twinrix vaccine No evidence of thrombocytopenia, does have anemia, recheck iron panel Volume overload with swelling of legs: Increase Lasix to 60 mg daily and add spironolactone 150 mg daily.  Advised patient to decrease to 40 mg 100 mg if the swelling has significantly improved in next 1 to 2 weeks.  Check BMP, magnesium today.  Recheck in 1 week.  Discussed about  strict low-sodium diet, information provided HRS none PSE: None HCC screening: No evidence of liver lesions, AFP normal, recommend ultrasound liver in 6 months Recommend Shingrix vaccine and pneumonia vaccine, annual influenza vaccine Reiterated on complete abstinence from alcohol use  Colon cancer screening Recommend screening colonoscopy and patient is agreeable  I have discussed alternative options, risks & benefits,  which include, but are not limited to, bleeding, infection, perforation,respiratory complication & drug reaction.  The patient agrees with this plan & written consent will be obtained.     Follow up in 3 months   Cephas Darby, MD

## 2021-03-17 ENCOUNTER — Other Ambulatory Visit: Payer: Self-pay

## 2021-03-17 ENCOUNTER — Telehealth: Payer: Self-pay

## 2021-03-17 ENCOUNTER — Other Ambulatory Visit: Payer: Self-pay | Admitting: Gastroenterology

## 2021-03-17 DIAGNOSIS — D649 Anemia, unspecified: Secondary | ICD-10-CM

## 2021-03-17 DIAGNOSIS — E559 Vitamin D deficiency, unspecified: Secondary | ICD-10-CM

## 2021-03-17 LAB — COMPREHENSIVE METABOLIC PANEL
ALT: 23 IU/L (ref 0–44)
AST: 60 IU/L — ABNORMAL HIGH (ref 0–40)
Albumin/Globulin Ratio: 0.7 — ABNORMAL LOW (ref 1.2–2.2)
Albumin: 2.7 g/dL — ABNORMAL LOW (ref 3.8–4.9)
Alkaline Phosphatase: 146 IU/L — ABNORMAL HIGH (ref 44–121)
BUN/Creatinine Ratio: 13 (ref 9–20)
BUN: 10 mg/dL (ref 6–24)
Bilirubin Total: 1.3 mg/dL — ABNORMAL HIGH (ref 0.0–1.2)
CO2: 21 mmol/L (ref 20–29)
Calcium: 8.3 mg/dL — ABNORMAL LOW (ref 8.7–10.2)
Chloride: 107 mmol/L — ABNORMAL HIGH (ref 96–106)
Creatinine, Ser: 0.75 mg/dL — ABNORMAL LOW (ref 0.76–1.27)
Globulin, Total: 3.8 g/dL (ref 1.5–4.5)
Glucose: 104 mg/dL — ABNORMAL HIGH (ref 65–99)
Potassium: 4 mmol/L (ref 3.5–5.2)
Sodium: 140 mmol/L (ref 134–144)
Total Protein: 6.5 g/dL (ref 6.0–8.5)
eGFR: 105 mL/min/{1.73_m2} (ref >59)

## 2021-03-17 LAB — CBC
Hematocrit: 27.9 % — ABNORMAL LOW (ref 37.5–51.0)
Hemoglobin: 8.8 g/dL — ABNORMAL LOW (ref 13.0–17.7)
MCH: 28.2 pg (ref 26.6–33.0)
MCHC: 31.5 g/dL (ref 31.5–35.7)
MCV: 89 fL (ref 79–97)
Platelets: 151 10*3/uL (ref 150–450)
RBC: 3.12 x10E6/uL — ABNORMAL LOW (ref 4.14–5.80)
RDW: 14.8 % (ref 11.6–15.4)
WBC: 4.5 10*3/uL (ref 3.4–10.8)

## 2021-03-17 LAB — IRON,TIBC AND FERRITIN PANEL
Ferritin: 60 ng/mL (ref 30–400)
Iron Saturation: 8 % — CL (ref 15–55)
Iron: 21 ug/dL — ABNORMAL LOW (ref 38–169)
Total Iron Binding Capacity: 250 ug/dL (ref 250–450)
UIBC: 229 ug/dL (ref 111–343)

## 2021-03-17 LAB — PROTIME-INR
INR: 1.2 (ref 0.9–1.2)
Prothrombin Time: 12 s (ref 9.1–12.0)

## 2021-03-17 LAB — VITAMIN D 25 HYDROXY (VIT D DEFICIENCY, FRACTURES): Vit D, 25-Hydroxy: 4.6 ng/mL — ABNORMAL LOW (ref 30.0–100.0)

## 2021-03-17 LAB — MAGNESIUM: Magnesium: 1.8 mg/dL (ref 1.6–2.3)

## 2021-03-17 MED ORDER — VITAMIN D (ERGOCALCIFEROL) 1.25 MG (50000 UNIT) PO CAPS: 50000.0000 [IU] | ORAL_CAPSULE | ORAL | 1 refills | Status: AC

## 2021-03-17 MED ORDER — VITAMIN D (ERGOCALCIFEROL) 1.25 MG (50000 UNIT) PO CAPS: 50000.0000 [IU] | ORAL_CAPSULE | ORAL | 0 refills | Status: AC

## 2021-03-17 NOTE — Telephone Encounter (Signed)
Patient verbalized understanding and will pick medications at the pharmacy

## 2021-03-17 NOTE — Telephone Encounter (Signed)
-----   Message from Lin Landsman, MD sent at 03/17/2021  1:14 PM EDT ----- He is still anemic but not worse.  Has severe vitamin D deficiency, sent in prescription for 50K units once a week to his pharmacy.  Also, I recommend to start taking vitamin B12 supplements 109mcg daily, OTC  [name]

## 2021-03-31 ENCOUNTER — Encounter: Payer: Self-pay | Admitting: Gastroenterology

## 2021-04-01 ENCOUNTER — Encounter: Payer: Self-pay | Admitting: Gastroenterology

## 2021-04-01 ENCOUNTER — Ambulatory Visit
Admission: RE | Admit: 2021-04-01 | Discharge: 2021-04-01 | Disposition: A | Discharge status: Home / Self Care | Payer: BC Managed Care – PPO | Attending: Gastroenterology | Admitting: Gastroenterology

## 2021-04-01 ENCOUNTER — Telehealth: Payer: Self-pay

## 2021-04-01 ENCOUNTER — Encounter
Admission: RE | Disposition: A | Discharge status: Home / Self Care | Payer: Self-pay | Source: Home / Self Care | Attending: Gastroenterology

## 2021-04-01 ENCOUNTER — Ambulatory Visit: Payer: BC Managed Care – PPO | Admitting: Registered Nurse

## 2021-04-01 DIAGNOSIS — K766 Portal hypertension: Secondary | ICD-10-CM | POA: Insufficient documentation

## 2021-04-01 DIAGNOSIS — Z809 Family history of malignant neoplasm, unspecified: Secondary | ICD-10-CM | POA: Insufficient documentation

## 2021-04-01 DIAGNOSIS — Z79899 Other long term (current) drug therapy: Secondary | ICD-10-CM | POA: Insufficient documentation

## 2021-04-01 DIAGNOSIS — Z801 Family history of malignant neoplasm of trachea, bronchus and lung: Secondary | ICD-10-CM | POA: Insufficient documentation

## 2021-04-01 DIAGNOSIS — Z8249 Family history of ischemic heart disease and other diseases of the circulatory system: Secondary | ICD-10-CM | POA: Insufficient documentation

## 2021-04-01 DIAGNOSIS — K5730 Diverticulosis of large intestine without perforation or abscess without bleeding: Secondary | ICD-10-CM | POA: Diagnosis not present

## 2021-04-01 DIAGNOSIS — I10 Essential (primary) hypertension: Secondary | ICD-10-CM | POA: Insufficient documentation

## 2021-04-01 DIAGNOSIS — K644 Residual hemorrhoidal skin tags: Secondary | ICD-10-CM | POA: Diagnosis not present

## 2021-04-01 DIAGNOSIS — I8500 Esophageal varices without bleeding: Secondary | ICD-10-CM | POA: Insufficient documentation

## 2021-04-01 DIAGNOSIS — K3189 Other diseases of stomach and duodenum: Secondary | ICD-10-CM | POA: Insufficient documentation

## 2021-04-01 DIAGNOSIS — D120 Benign neoplasm of cecum: Secondary | ICD-10-CM | POA: Diagnosis not present

## 2021-04-01 DIAGNOSIS — Z8711 Personal history of peptic ulcer disease: Secondary | ICD-10-CM | POA: Insufficient documentation

## 2021-04-01 DIAGNOSIS — Z1211 Encounter for screening for malignant neoplasm of colon: Secondary | ICD-10-CM | POA: Diagnosis not present

## 2021-04-01 DIAGNOSIS — K635 Polyp of colon: Secondary | ICD-10-CM

## 2021-04-01 DIAGNOSIS — D124 Benign neoplasm of descending colon: Secondary | ICD-10-CM | POA: Insufficient documentation

## 2021-04-01 HISTORY — PX: ESOPHAGOGASTRODUODENOSCOPY (EGD) WITH PROPOFOL: SHX5813

## 2021-04-01 HISTORY — DX: Essential (primary) hypertension: I10

## 2021-04-01 HISTORY — PX: COLONOSCOPY WITH PROPOFOL: SHX5780

## 2021-04-01 LAB — BASIC METABOLIC PANEL
Anion gap: 7 (ref 5–15)
BUN: 19 mg/dL (ref 6–20)
CO2: 21 mmol/L — ABNORMAL LOW (ref 22–32)
Calcium: 8.9 mg/dL (ref 8.9–10.3)
Chloride: 105 mmol/L (ref 98–111)
Creatinine, Ser: 1.05 mg/dL (ref 0.61–1.24)
GFR, Estimated: >60 mL/min (ref >60)
Glucose, Bld: 147 mg/dL — ABNORMAL HIGH (ref 70–99)
Potassium: 4.2 mmol/L (ref 3.5–5.1)
Sodium: 133 mmol/L — ABNORMAL LOW (ref 135–145)

## 2021-04-01 SURGERY — COLONOSCOPY WITH PROPOFOL
Anesthesia: General

## 2021-04-01 MED ORDER — GLYCOPYRROLATE 0.2 MG/ML IJ SOLN
INTRAMUSCULAR | Status: DC | PRN
  Administered 2021-04-01: .1 mg via INTRAVENOUS

## 2021-04-01 MED ORDER — DEXMEDETOMIDINE HCL 200 MCG/2ML IV SOLN
INTRAVENOUS | Status: DC | PRN
  Administered 2021-04-01: 20 ug via INTRAVENOUS

## 2021-04-01 MED ORDER — SODIUM CHLORIDE 0.9 % IV SOLN
INTRAVENOUS | Status: DC
Start: 2021-04-01 — End: 2021-04-01

## 2021-04-01 MED ORDER — EPHEDRINE SULFATE 50 MG/ML IJ SOLN
INTRAMUSCULAR | Status: DC | PRN
  Administered 2021-04-01: 7.5 mg via INTRAVENOUS
  Administered 2021-04-01: 5 mg via INTRAVENOUS
  Administered 2021-04-01: 15 mg via INTRAVENOUS

## 2021-04-01 MED ORDER — LIDOCAINE HCL (CARDIAC) PF 100 MG/5ML IV SOSY
PREFILLED_SYRINGE | INTRAVENOUS | Status: DC | PRN
  Administered 2021-04-01: 100 mg via INTRAVENOUS

## 2021-04-01 MED ORDER — PHENYLEPHRINE HCL (PRESSORS) 10 MG/ML IV SOLN
INTRAVENOUS | Status: DC | PRN
  Administered 2021-04-01 (×2): 200 ug via INTRAVENOUS
  Administered 2021-04-01: 100 ug via INTRAVENOUS
  Administered 2021-04-01: 200 ug via INTRAVENOUS

## 2021-04-01 MED ORDER — PROPOFOL 10 MG/ML IV BOLUS
INTRAVENOUS | Status: AC
  Filled 2021-04-01: qty 20

## 2021-04-01 MED ORDER — PROPOFOL 500 MG/50ML IV EMUL
INTRAVENOUS | Status: DC | PRN
  Administered 2021-04-01: 150 ug/kg/min via INTRAVENOUS

## 2021-04-01 MED ORDER — PROPOFOL 10 MG/ML IV BOLUS
INTRAVENOUS | Status: DC | PRN
  Administered 2021-04-01: 90 mg via INTRAVENOUS
  Administered 2021-04-01: 10 mg via INTRAVENOUS

## 2021-04-01 MED ORDER — EPHEDRINE 5 MG/ML INJ
INTRAVENOUS | Status: AC
  Filled 2021-04-01: qty 10

## 2021-04-01 NOTE — Op Note (Signed)
[facility] Gastroenterology Patient Name: Terrence Martin Procedure Date: 04/01/2021 9:32 AM MRN: [id number] Account #: [id number] Date of Birth: 23-Jul-1966 Admit Type: Outpatient Age: 55 Room: [facility] ENDO ROOM 4 Gender: Male Note Status: Finalized Procedure:             Upper GI endoscopy Indications:           Follow-up of esophageal varices Providers:             Lin Landsman MD, MD Referring MD:          No Local Md, MD (Referring MD) Medicines:             General Anesthesia Complications:         No immediate complications. Estimated blood loss: None. Procedure:             Pre-Anesthesia Assessment:                        - Prior to the procedure, a History and Physical was                         performed, and patient medications and allergies were                         reviewed. The patient is competent. The risks and                         benefits of the procedure and the sedation options and                         risks were discussed with the patient. All questions                         were answered and informed consent was obtained.                         Patient identification and proposed procedure were                         verified by the physician, the nurse, the                         anesthesiologist, the anesthetist and the technician                         in the pre-procedure area in the procedure room in the                         endoscopy suite. Mental Status Examination: alert and                         oriented. Airway Examination: normal oropharyngeal                         airway and neck mobility. Respiratory Examination:                         clear to auscultation. CV Examination: normal.  Prophylactic Antibiotics: The patient does not require                         prophylactic antibiotics. Prior Anticoagulants: The                         patient has taken no previous anticoagulant or                          antiplatelet agents. ASA Grade Assessment: III - A                         patient with severe systemic disease. After reviewing                         the risks and benefits, the patient was deemed in                         satisfactory condition to undergo the procedure. The                         anesthesia plan was to use general anesthesia.                         Immediately prior to administration of medications,                         the patient was re-assessed for adequacy to receive                         sedatives. The heart rate, respiratory rate, oxygen                         saturations, blood pressure, adequacy of pulmonary                         ventilation, and response to care were monitored                         throughout the procedure. The physical status of the                         patient was re-assessed after the procedure.                        After obtaining informed consent, the endoscope was                         passed under direct vision. Throughout the procedure,                         the patient's blood pressure, pulse, and oxygen                         saturations were monitored continuously. The Endoscope                         was introduced through the mouth, and advanced to the  second part of duodenum. The upper GI endoscopy was                         accomplished without difficulty. The patient tolerated                         the procedure well. Findings:      The duodenal bulb and second portion of the duodenum were normal.      Mild, diffuse portal hypertensive gastropathy was found in the entire       examined stomach.      Three columns of small (< 5 mm) varices with no bleeding and no stigmata       of recent bleeding were found in the mid esophagus and in the distal       esophagus,. No red wale signs were present. Scarring from prior       treatment was visible. Evidence of  partial eradication was visible. The       varices appeared smaller than they were at prior exam. Impression:            - Normal duodenal bulb and second portion of the                         duodenum.                        - Portal hypertensive gastropathy.                        - Small (< 5 mm) esophageal varices with no bleeding                         and no stigmata of recent bleeding.                        - No specimens collected. Recommendation:        - Continue present medications.                        - Repeat upper endoscopy in 6 months for surveillance.                        - Proceed with colonoscopy as scheduled                        See colonoscopy report Procedure Code(s):     --- Professional ---                        [phone], Esophagogastroduodenoscopy, flexible,                         transoral; diagnostic, including collection of                         specimen(s) by brushing or washing, when performed                         (separate procedure) Diagnosis Code(s):     --- Professional ---  K76.6, Portal hypertension                        K31.89, Other diseases of stomach and duodenum                        I85.00, Esophageal varices without bleeding CPT copyright 2019 American Medical Association. All rights reserved. The codes documented in this report are preliminary and upon coder review may  be revised to meet current compliance requirements. Dr. Ulyess Mort Lin Landsman MD, MD 04/01/2021 9:48:10 AM This report has been signed electronically. Number of Addenda: 0 Note Initiated On: 04/01/2021 9:32 AM Estimated Blood Loss:  Estimated blood loss: none.      [facility]

## 2021-04-01 NOTE — Anesthesia Postprocedure Evaluation (Signed)
Anesthesia Post Note  Patient: Terrence Martin  Procedure(s) Performed: COLONOSCOPY WITH PROPOFOL (N/A ) ESOPHAGOGASTRODUODENOSCOPY (EGD) WITH PROPOFOL (N/A )  Patient location during evaluation: Endoscopy Anesthesia Type: General Level of consciousness: awake and alert and oriented Pain management: pain level controlled Vital Signs Assessment: post-procedure vital signs reviewed and stable Respiratory status: spontaneous breathing Cardiovascular status: blood pressure returned to baseline Anesthetic complications: no   No complications documented.   Last Vitals:  Vitals:   04/01/21 1033 04/01/21 1043  BP: 119/81 118/69  Pulse: 69 64  Resp: 17 18  Temp: (!) 35.6 C   SpO2: 100% 100%    Last Pain:  Vitals:   04/01/21 1043  TempSrc:   PainSc: 0-No pain                 Rosana Farnell

## 2021-04-01 NOTE — Op Note (Signed)
[facility] Gastroenterology Patient Name: Terrence Martin Procedure Date: 04/01/2021 9:31 AM MRN: [id number] Account #: [id number] Date of Birth: 1966-05-29 Admit Type: Outpatient Age: 55 Room: [facility] ENDO ROOM 4 Gender: Male Note Status: Finalized Procedure:             Colonoscopy Indications:           Screening for colorectal malignant neoplasm, This is                         the patient's first colonoscopy Providers:             Lin Landsman MD, MD Referring MD:          No Local Md, MD (Referring MD) Medicines:             General Anesthesia Complications:         No immediate complications. Estimated blood loss: None. Procedure:             Pre-Anesthesia Assessment:                        - Prior to the procedure, a History and Physical was                         performed, and patient medications and allergies were                         reviewed. The patient is competent. The risks and                         benefits of the procedure and the sedation options and                         risks were discussed with the patient. All questions                         were answered and informed consent was obtained.                         Patient identification and proposed procedure were                         verified by the physician, the nurse, the                         anesthesiologist, the anesthetist and the technician                         in the pre-procedure area in the procedure room in the                         endoscopy suite. Mental Status Examination: alert and                         oriented. Airway Examination: normal oropharyngeal                         airway and neck mobility. Respiratory Examination:  clear to auscultation. CV Examination: normal.                         Prophylactic Antibiotics: The patient does not require                         prophylactic antibiotics. Prior Anticoagulants: The                          patient has taken no previous anticoagulant or                         antiplatelet agents. ASA Grade Assessment: III - A                         patient with severe systemic disease. After reviewing                         the risks and benefits, the patient was deemed in                         satisfactory condition to undergo the procedure. The                         anesthesia plan was to use general anesthesia.                         Immediately prior to administration of medications,                         the patient was re-assessed for adequacy to receive                         sedatives. The heart rate, respiratory rate, oxygen                         saturations, blood pressure, adequacy of pulmonary                         ventilation, and response to care were monitored                         throughout the procedure. The physical status of the                         patient was re-assessed after the procedure.                        After obtaining informed consent, the colonoscope was                         passed under direct vision. Throughout the procedure,                         the patient's blood pressure, pulse, and oxygen                         saturations were monitored continuously. The  Colonoscope was introduced through the anus and                         advanced to the the terminal ileum, with                         identification of the appendiceal orifice and IC                         valve. The colonoscopy was performed without                         difficulty. The patient tolerated the procedure well.                         The quality of the bowel preparation was evaluated                         using the BBPS [facility] Bowel Preparation Scale) with                         scores of: Right Colon = 3, Transverse Colon = 3 and                         Left Colon = 3 (entire mucosa seen well with no                          residual staining, small fragments of stool or opaque                         liquid). The total BBPS score equals 9. Findings:      The perianal and digital rectal examinations were normal. Pertinent       negatives include normal sphincter tone and no palpable rectal lesions.      Three sessile polyps were found in the descending colon and cecum. The       polyps were 3 to 5 mm in size. These polyps were removed with a cold       snare. Resection and retrieval were complete. Estimated blood loss: none.      Multiple diverticula were found in the sigmoid colon.      Non-bleeding external hemorrhoids were found during retroflexion. The       hemorrhoids were medium-sized. Impression:            - Three 3 to 5 mm polyps in the descending colon and                         in the cecum, removed with a cold snare. Resected and                         retrieved.                        - Diverticulosis in the sigmoid colon.                        - Non-bleeding external hemorrhoids. Recommendation:        - Discharge patient to home (with  escort).                        - Low sodium diet.                        - Continue present medications.                        - Await pathology results.                        - Repeat colonoscopy in 7 years for surveillance.                        - Return to my office as previously scheduled. Procedure Code(s):     --- Professional ---                        [phone], Colonoscopy, flexible; with removal of                         tumor(s), polyp(s), or other lesion(s) by snare                         technique Diagnosis Code(s):     --- Professional ---                        Z12.11, Encounter for screening for malignant neoplasm                         of colon                        K63.5, Polyp of colon                        K64.4, Residual hemorrhoidal skin tags                        K57.30, Diverticulosis of large intestine without                          perforation or abscess without bleeding CPT copyright 2019 American Medical Association. All rights reserved. The codes documented in this report are preliminary and upon coder review may  be revised to meet current compliance requirements. Dr. Ulyess Mort Lin Landsman MD, MD 04/01/2021 10:13:51 AM This report has been signed electronically. Number of Addenda: 0 Note Initiated On: 04/01/2021 9:31 AM Scope Withdrawal Time: 0 hours 16 minutes 28 seconds  Total Procedure Duration: 0 hours 19 minutes 23 seconds  Estimated Blood Loss:  Estimated blood loss: none.      [facility]

## 2021-04-01 NOTE — Anesthesia Preprocedure Evaluation (Addendum)
Anesthesia Evaluation  Patient identified by MRN, date of birth, ID band Patient awake    Reviewed: Allergy & Precautions, H&P , NPO status , Patient's Chart, lab work & pertinent test results, reviewed documented beta blocker date and time   History of Anesthesia Complications Negative for: history of anesthetic complications  Airway Mallampati: III  TM Distance: >3 FB Neck ROM: full    Dental  (+) Dental Advidsory Given, Missing, Teeth Intact   Pulmonary neg shortness of breath, asthma (as a child) , neg sleep apnea, neg COPD, Recent URI  (COVID in early February),    Pulmonary exam normal breath sounds clear to auscultation       Cardiovascular Exercise Tolerance: Good hypertension, Normal cardiovascular exam Rhythm:regular Rate:Normal     Neuro/Psych PSYCHIATRIC DISORDERS negative neurological ROS     GI/Hepatic PUD, neg GERD  ,(+) Cirrhosis   Esophageal Varices    ,   Endo/Other  negative endocrine ROS  Renal/GU negative Renal ROS  negative genitourinary   Musculoskeletal   Abdominal   Peds negative pediatric ROS (+)  Hematology negative hematology ROS (+) anemia ,   Anesthesia Other Findings Past Medical History: No date: GIB (gastrointestinal bleeding) 08/24/2020: IDA (iron deficiency anemia) No date: PUD (peptic ulcer disease)   Reproductive/Obstetrics negative OB ROS                            Anesthesia Physical  Anesthesia Plan  ASA: III  Anesthesia Plan: General   Post-op Pain Management:    Induction: Intravenous  PONV Risk Score and Plan: 2 and TIVA and Propofol infusion  Airway Management Planned: Natural Airway and Nasal Cannula  Additional Equipment:   Intra-op Plan:   Post-operative Plan:   Informed Consent: I have reviewed the patients History and Physical, chart, labs and discussed the procedure including the risks, benefits and alternatives for the  proposed anesthesia with the patient or authorized representative who has indicated his/her understanding and acceptance.     Dental Advisory Given  Plan Discussed with: Anesthesiologist, CRNA and Surgeon  Anesthesia Plan Comments:         Anesthesia Quick Evaluation

## 2021-04-01 NOTE — Telephone Encounter (Signed)
Patient verbalized understanding. He states he is taking 60mg  of the Furosamide and 150mg  of spironolactone. He will go down to taking 40mg  of furosemide and 100mg  of spironolactone

## 2021-04-01 NOTE — H&P (Signed)
Terrence Darby, MD [address]  [place]  [place], [place] [address]  Main: [phone]  Fax: [phone] Pager: [phone]  Primary Care Physician:  Pcp, No Primary Gastroenterologist:  Dr. Cephas Martin  Pre-Procedure History & Physical: HPI:  Terrence Martin is a 55 y.o. male is here for an endoscopy and colonoscopy.   Past Medical History:  Diagnosis Date  . GIB (gastrointestinal bleeding)   . Hypertension   . IDA (iron deficiency anemia) 08/24/2020  . PUD (peptic ulcer disease)     Past Surgical History:  Procedure Laterality Date  . ESOPHAGOGASTRODUODENOSCOPY (EGD) WITH PROPOFOL N/A 02/17/2021   Procedure: ESOPHAGOGASTRODUODENOSCOPY (EGD) WITH PROPOFOL;  Surgeon: Lin Landsman, MD;  Location: [facility];  Service: Gastroenterology;  Laterality: N/A;    Prior to Admission medications   Medication Sig Start Date End Date Taking? Authorizing Provider  furosemide (LASIX) 20 MG tablet Take 3 tablets (60 mg total) by mouth daily. 03/16/21 04/15/21  Lin Landsman, MD  nadolol (CORGARD) 20 MG tablet Take 1 tablet (20 mg total) by mouth daily. 03/16/21 04/15/21  Lin Landsman, MD  spironolactone (ALDACTONE) 50 MG tablet Take 3 tablets (150 mg total) by mouth daily. 03/16/21 04/15/21  Lin Landsman, MD  Vitamin D, Ergocalciferol, (DRISDOL) 1.25 MG (50000 UNIT) CAPS capsule Take 1 capsule (50,000 Units total) by mouth every 7 (seven) days for 16 doses. 03/17/21 07/01/21  Lin Landsman, MD  Vitamin D, Ergocalciferol, (DRISDOL) 1.25 MG (50000 UNIT) CAPS capsule Take 1 capsule (50,000 Units total) by mouth every 7 (seven) days. 03/17/21   Lin Landsman, MD    Allergies as of 03/16/2021  . (No Known Allergies)    Family History  Problem Relation Age of Onset  . Lung cancer Mother   . Cancer Father   . Heart attack Brother     Social History   Socioeconomic History  . Marital status: Single    Spouse name: Not on file  . Number of  children: Not on file  . Years of education: Not on file  . Highest education level: Not on file  Occupational History  . Not on file  Tobacco Use  . Smoking status: Never Smoker  . Smokeless tobacco: Never Used  Vaping Use  . Vaping Use: Never used  Substance and Sexual Activity  . Alcohol use: Not Currently    Alcohol/week: 72.0 standard drinks    Types: 72 Cans of beer per week  . Drug use: Never  . Sexual activity: Not on file  Other Topics Concern  . Not on file  Social History Narrative   Lives locally.  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  Does not routinely exercise.   Social Determinants of Health   Financial Resource Strain: Not on file  Food Insecurity: Not on file  Transportation Needs: Not on file  Physical Activity: Not on file  Stress: Not on file  Social Connections: Not on file  Intimate Partner Violence: Not on file    Review of Systems: See HPI, otherwise negative ROS  Physical Exam: BP 121/75   Pulse 64   Temp (!) 97.3 F (36.3 C) (Temporal)   Resp 17   Ht 5\' 8"  (1.727 m)   Wt 104.3 kg   SpO2 100%   BMI 34.97 kg/m  General:   Alert,  pleasant and cooperative in NAD Head:  Normocephalic and atraumatic. Neck:  Supple; no masses or thyromegaly. Lungs:  Clear throughout to auscultation.  Heart:  Regular rate and rhythm. Abdomen:  Soft, nontender and nondistended. Normal bowel sounds, without guarding, and without rebound.   Neurologic:  Alert and  oriented x4;  grossly normal neurologically.  Impression/Plan: Terrence Martin is here for an endoscopy and colonoscopy to be performed for follow up of varices and colon cancer screening  Risks, benefits, limitations, and alternatives regarding  endoscopy and colonoscopy have been reviewed with the patient.  Questions have been answered.  All parties agreeable.   Sherri Sear, MD  04/01/2021, 9:32 AM

## 2021-04-01 NOTE — Transfer of Care (Signed)
Immediate Anesthesia Transfer of Care Note  Patient: Terrence Martin  Procedure(s) Performed: COLONOSCOPY WITH PROPOFOL (N/A ) ESOPHAGOGASTRODUODENOSCOPY (EGD) WITH PROPOFOL (N/A )  Patient Location: Endoscopy Unit  Anesthesia Type:General  Level of Consciousness: drowsy  Airway & Oxygen Therapy: Patient Spontanous Breathing  Post-op Assessment: Report given to RN and Post -op Vital signs reviewed and stable  Post vital signs: Reviewed and stable  Last Vitals:  Vitals Value Taken Time  BP 76/50 04/01/21 1015  Temp    Pulse 67 04/01/21 1016  Resp 21 04/01/21 1016  SpO2 99 % 04/01/21 1016  Vitals shown include unvalidated device data.  Last Pain:  Vitals:   04/01/21 1013  TempSrc:   PainSc: Asleep         Complications: No complications documented.   Hypotension treated with phenylephrine and ephedrine boluses.

## 2021-04-01 NOTE — Telephone Encounter (Signed)
-----   Message from Lin Landsman, MD sent at 04/01/2021  1:49 PM EDT ----- His kidney function is overall normal.  Sodium levels are slightly low.  Please check with him how much dose of Lasix and spironolactone he is taking.  If he is already taking 40 mg and 100 mg, he can continue the same.  If he is taking higher dose, please ask him to reduce to this dose  RV

## 2021-04-02 ENCOUNTER — Encounter: Payer: Self-pay | Admitting: Gastroenterology

## 2021-04-02 LAB — SURGICAL PATHOLOGY

## 2021-04-08 LAB — B12 AND FOLATE PANEL
Folate: 4 ng/mL (ref >3.0)
Vitamin B-12: 891 pg/mL (ref 232–1245)

## 2021-04-08 LAB — SPECIMEN STATUS REPORT

## 2021-04-13 ENCOUNTER — Ambulatory Visit: Payer: BC Managed Care – PPO

## 2021-04-13 ENCOUNTER — Encounter: Payer: Self-pay | Admitting: *Deleted

## 2021-05-10 ENCOUNTER — Ambulatory Visit: Payer: BC Managed Care – PPO | Admitting: Adult Health

## 2021-05-10 DIAGNOSIS — Z0289 Encounter for other administrative examinations: Secondary | ICD-10-CM

## 2021-06-10 ENCOUNTER — Ambulatory Visit: Payer: BC Managed Care – PPO | Admitting: Cardiology

## 2021-06-11 ENCOUNTER — Encounter: Payer: Self-pay | Admitting: Cardiology

## 2021-07-05 ENCOUNTER — Ambulatory Visit: Payer: BC Managed Care – PPO | Admitting: Gastroenterology

## 2021-07-12 ENCOUNTER — Other Ambulatory Visit: Payer: Self-pay

## 2021-07-13 ENCOUNTER — Ambulatory Visit: Payer: BC Managed Care – PPO | Admitting: Gastroenterology

## 2021-08-23 ENCOUNTER — Other Ambulatory Visit: Payer: Self-pay

## 2021-08-23 ENCOUNTER — Inpatient Hospital Stay
Admission: EM | Admit: 2021-08-23 | Discharge: 2021-09-01 | DRG: 070 | Disposition: A | Discharge status: Rehabilitation Facility | Payer: BC Managed Care – PPO | Attending: Internal Medicine | Admitting: Internal Medicine

## 2021-08-23 DIAGNOSIS — E875 Hyperkalemia: Secondary | ICD-10-CM | POA: Diagnosis present

## 2021-08-23 DIAGNOSIS — D62 Acute posthemorrhagic anemia: Secondary | ICD-10-CM | POA: Diagnosis present

## 2021-08-23 DIAGNOSIS — I82612 Acute embolism and thrombosis of superficial veins of left upper extremity: Secondary | ICD-10-CM | POA: Diagnosis not present

## 2021-08-23 DIAGNOSIS — I10 Essential (primary) hypertension: Secondary | ICD-10-CM | POA: Diagnosis present

## 2021-08-23 DIAGNOSIS — K7460 Unspecified cirrhosis of liver: Secondary | ICD-10-CM | POA: Diagnosis not present

## 2021-08-23 DIAGNOSIS — Z888 Allergy status to other drugs, medicaments and biological substances status: Secondary | ICD-10-CM | POA: Diagnosis not present

## 2021-08-23 DIAGNOSIS — K7030 Alcoholic cirrhosis of liver without ascites: Secondary | ICD-10-CM | POA: Diagnosis present

## 2021-08-23 DIAGNOSIS — K766 Portal hypertension: Secondary | ICD-10-CM | POA: Diagnosis present

## 2021-08-23 DIAGNOSIS — N179 Acute kidney failure, unspecified: Secondary | ICD-10-CM | POA: Diagnosis not present

## 2021-08-23 DIAGNOSIS — R609 Edema, unspecified: Secondary | ICD-10-CM

## 2021-08-23 DIAGNOSIS — K7200 Acute and subacute hepatic failure without coma: Secondary | ICD-10-CM | POA: Diagnosis not present

## 2021-08-23 DIAGNOSIS — Z801 Family history of malignant neoplasm of trachea, bronchus and lung: Secondary | ICD-10-CM

## 2021-08-23 DIAGNOSIS — E872 Acidosis, unspecified: Secondary | ICD-10-CM | POA: Diagnosis not present

## 2021-08-23 DIAGNOSIS — I82622 Acute embolism and thrombosis of deep veins of left upper extremity: Secondary | ICD-10-CM | POA: Diagnosis not present

## 2021-08-23 DIAGNOSIS — G9341 Metabolic encephalopathy: Principal | ICD-10-CM | POA: Diagnosis present

## 2021-08-23 DIAGNOSIS — K7682 Hepatic encephalopathy: Secondary | ICD-10-CM

## 2021-08-23 DIAGNOSIS — Z8249 Family history of ischemic heart disease and other diseases of the circulatory system: Secondary | ICD-10-CM | POA: Diagnosis not present

## 2021-08-23 DIAGNOSIS — K7290 Hepatic failure, unspecified without coma: Secondary | ICD-10-CM | POA: Diagnosis not present

## 2021-08-23 DIAGNOSIS — M79602 Pain in left arm: Secondary | ICD-10-CM

## 2021-08-23 DIAGNOSIS — F1010 Alcohol abuse, uncomplicated: Secondary | ICD-10-CM | POA: Diagnosis not present

## 2021-08-23 DIAGNOSIS — K7040 Alcoholic hepatic failure without coma: Secondary | ICD-10-CM | POA: Diagnosis present

## 2021-08-23 DIAGNOSIS — R531 Weakness: Secondary | ICD-10-CM

## 2021-08-23 DIAGNOSIS — I8511 Secondary esophageal varices with bleeding: Secondary | ICD-10-CM | POA: Diagnosis present

## 2021-08-23 DIAGNOSIS — Z86718 Personal history of other venous thrombosis and embolism: Secondary | ICD-10-CM

## 2021-08-23 DIAGNOSIS — D696 Thrombocytopenia, unspecified: Secondary | ICD-10-CM | POA: Diagnosis present

## 2021-08-23 DIAGNOSIS — Z20822 Contact with and (suspected) exposure to covid-19: Secondary | ICD-10-CM | POA: Diagnosis present

## 2021-08-23 DIAGNOSIS — I82409 Acute embolism and thrombosis of unspecified deep veins of unspecified lower extremity: Secondary | ICD-10-CM

## 2021-08-23 DIAGNOSIS — K3189 Other diseases of stomach and duodenum: Secondary | ICD-10-CM | POA: Diagnosis present

## 2021-08-23 HISTORY — DX: Thrombocytopenia, unspecified: D69.6

## 2021-08-23 HISTORY — DX: Hyperkalemia: E87.5

## 2021-08-23 LAB — CBC WITH DIFFERENTIAL/PLATELET
Abs Immature Granulocytes: 0.08 10*3/uL — ABNORMAL HIGH (ref 0.00–0.07)
Basophils Absolute: 0 10*3/uL (ref 0.0–0.1)
Basophils Relative: 0 %
Eosinophils Absolute: 0 10*3/uL (ref 0.0–0.5)
Eosinophils Relative: 0 %
HCT: 19.4 % — ABNORMAL LOW (ref 39.0–52.0)
Hemoglobin: 6.6 g/dL — ABNORMAL LOW (ref 13.0–17.0)
Immature Granulocytes: 1 %
Lymphocytes Relative: 16 %
Lymphs Abs: 1.8 10*3/uL (ref 0.7–4.0)
MCH: 28.8 pg (ref 26.0–34.0)
MCHC: 34 g/dL (ref 30.0–36.0)
MCV: 84.7 fL (ref 80.0–100.0)
Monocytes Absolute: 1.4 10*3/uL — ABNORMAL HIGH (ref 0.1–1.0)
Monocytes Relative: 13 %
Neutro Abs: 7.6 10*3/uL (ref 1.7–7.7)
Neutrophils Relative %: 70 %
Platelets: 104 10*3/uL — ABNORMAL LOW (ref 150–400)
RBC: 2.29 MIL/uL — ABNORMAL LOW (ref 4.22–5.81)
RDW: 18.9 % — ABNORMAL HIGH (ref 11.5–15.5)
Smear Review: DECREASED
WBC: 10.8 10*3/uL — ABNORMAL HIGH (ref 4.0–10.5)
nRBC: 0 % (ref 0.0–0.2)

## 2021-08-23 LAB — LACTIC ACID, PLASMA
Lactic Acid, Venous: 3.2 mmol/L (ref 0.5–1.9)
Lactic Acid, Venous: 3.3 mmol/L (ref 0.5–1.9)

## 2021-08-23 LAB — COMPREHENSIVE METABOLIC PANEL
ALT: 32 U/L (ref 0–44)
AST: 72 U/L — ABNORMAL HIGH (ref 15–41)
Albumin: 2.1 g/dL — ABNORMAL LOW (ref 3.5–5.0)
Alkaline Phosphatase: 72 U/L (ref 38–126)
Anion gap: 5 (ref 5–15)
BUN: 28 mg/dL — ABNORMAL HIGH (ref 6–20)
CO2: 21 mmol/L — ABNORMAL LOW (ref 22–32)
Calcium: 7.8 mg/dL — ABNORMAL LOW (ref 8.9–10.3)
Chloride: 108 mmol/L (ref 98–111)
Creatinine, Ser: 0.99 mg/dL (ref 0.61–1.24)
GFR, Estimated: >60 mL/min (ref >60)
Glucose, Bld: 197 mg/dL — ABNORMAL HIGH (ref 70–99)
Potassium: 5.2 mmol/L — ABNORMAL HIGH (ref 3.5–5.1)
Sodium: 134 mmol/L — ABNORMAL LOW (ref 135–145)
Total Bilirubin: 2.2 mg/dL — ABNORMAL HIGH (ref 0.3–1.2)
Total Protein: 5.9 g/dL — ABNORMAL LOW (ref 6.5–8.1)

## 2021-08-23 LAB — PROTIME-INR
INR: 1.4 — ABNORMAL HIGH (ref 0.8–1.2)
Prothrombin Time: 17.1 seconds — ABNORMAL HIGH (ref 11.4–15.2)

## 2021-08-23 LAB — PREPARE RBC (CROSSMATCH)

## 2021-08-23 LAB — RESP PANEL BY RT-PCR (FLU A&B, COVID) ARPGX2
Influenza A by PCR: NEGATIVE
Influenza B by PCR: NEGATIVE
SARS Coronavirus 2 by RT PCR: NEGATIVE

## 2021-08-23 LAB — MAGNESIUM: Magnesium: 1.5 mg/dL — ABNORMAL LOW (ref 1.7–2.4)

## 2021-08-23 MED ORDER — PANTOPRAZOLE 80MG IVPB - SIMPLE MED
80.0000 mg | Freq: Once | INTRAVENOUS | Status: AC
  Administered 2021-08-23: 80 mg via INTRAVENOUS
  Filled 2021-08-23: qty 100

## 2021-08-23 MED ORDER — SODIUM CHLORIDE 0.9 % IV SOLN
2.0000 g | Freq: Once | INTRAVENOUS | Status: AC
  Administered 2021-08-23: 2 g via INTRAVENOUS
  Filled 2021-08-23: qty 20

## 2021-08-23 MED ORDER — ONDANSETRON HCL 4 MG/2ML IJ SOLN
4.0000 mg | Freq: Once | INTRAMUSCULAR | Status: AC
  Administered 2021-08-23: 4 mg via INTRAVENOUS
  Filled 2021-08-23: qty 2

## 2021-08-23 MED ORDER — SODIUM CHLORIDE 0.9 % IV SOLN
50.0000 ug/h | INTRAVENOUS | Status: DC
  Administered 2021-08-23 – 2021-08-27 (×8): 50 ug/h via INTRAVENOUS
  Filled 2021-08-23 (×14): qty 1

## 2021-08-23 MED ORDER — PANTOPRAZOLE INFUSION (NEW) - SIMPLE MED
8.0000 mg/h | INTRAVENOUS | Status: AC
Start: 2021-08-23 — End: 2021-08-26
  Administered 2021-08-23 – 2021-08-26 (×5): 8 mg/h via INTRAVENOUS
  Filled 2021-08-23: qty 80
  Filled 2021-08-23 (×5): qty 100
  Filled 2021-08-23: qty 80

## 2021-08-23 MED ORDER — SODIUM CHLORIDE 0.9 % IV SOLN
10.0000 mL/h | Freq: Once | INTRAVENOUS | Status: AC
  Administered 2021-08-23: 10 mL/h via INTRAVENOUS

## 2021-08-23 MED ORDER — OCTREOTIDE LOAD VIA INFUSION
50.0000 ug | Freq: Once | INTRAVENOUS | Status: AC
  Administered 2021-08-23: 50 ug via INTRAVENOUS
  Filled 2021-08-23: qty 25

## 2021-08-23 NOTE — ED Notes (Signed)
Blood bank contacted about released blood ~5 mins til ready.

## 2021-08-23 NOTE — H&P (Signed)
History and Physical   Lion Fernandez VEH:[id number] DOB: Feb 11, 1966 DOA: 08/23/2021  Referring MD/NP/PA: Dr. Ellender Hose  PCP: Pcp, No   Outpatient Specialists: Dr. Marius Ditch  Patient coming from: Home   Chief Complaint: Vomiting blood  HPI: Terrence Martin is a 55 y.o. male with medical history significant of alcohol abuse with liver cirrhosis and variceal bleed in the past, iron deficiency anemia, peptic ulcer disease, essential hypertension, who presents to the ER with 3 episode of grossly bloody emesis.  He woke up this morning with the same.  He has been feeling lightheaded dizzy since then.  Patient also reported black tarry stools throughout the day.  No abdominal pain.  He had 2 additional episode of grossly bloody's emesis again this evening and came in to the ER where he is now being evaluated.  His hemoglobin has dropped to 6.6.  No current anticoagulation.  No fever no chills.  Denied bright red blood per rectum.  Patient at this point diagnosed with variceal bleed.  GI consulted.  Started on octreotide and IV Protonix and being admitted to the hospitalist service for further evaluation..  ED Course: Temperature 100 blood pressure 94/55, pulse 122 respirate 27 oxygen sats 98% room air.  Sodium is 131 potassium 5.2 chloride 108 CO2 21 glucose 197 BUN 28 creatinine 0.99 calcium 7.8.  Magnesium 1.5.  372 albumin 2.0.  White count 10.8 hemoglobin 6.7 platelets 104.  Lactic acid 3.2.  PT 7 troponin INR 1.4.  2 units of packed red blood cells ordered in the ER patient being admitted with variceal bleed.  Review of Systems: As per HPI otherwise 10 point review of systems negative.    Past Medical History:  Diagnosis Date   GIB (gastrointestinal bleeding)    Hypertension    IDA (iron deficiency anemia) 08/24/2020   PUD (peptic ulcer disease)     Past Surgical History:  Procedure Laterality Date   COLONOSCOPY WITH PROPOFOL N/A 04/01/2021   Procedure: COLONOSCOPY WITH PROPOFOL;  Surgeon: Lin Landsman, MD;  Location[facility] ENDOSCOPY;  Service: Gastroenterology;  Laterality: N/A;   ESOPHAGOGASTRODUODENOSCOPY (EGD) WITH PROPOFOL N/A 02/17/2021   Procedure: ESOPHAGOGASTRODUODENOSCOPY (EGD) WITH PROPOFOL;  Surgeon: Lin Landsman, MD;  Location: [facility] ENDOSCOPY;  Service: Gastroenterology;  Laterality: N/A;   ESOPHAGOGASTRODUODENOSCOPY (EGD) WITH PROPOFOL N/A 04/01/2021   Procedure: ESOPHAGOGASTRODUODENOSCOPY (EGD) WITH PROPOFOL;  Surgeon: Lin Landsman, MD;  Location: [facility] ENDOSCOPY;  Service: Gastroenterology;  Laterality: N/A;     reports that he has never smoked. He has never used smokeless tobacco. He reports that he does not currently use alcohol after a past usage of about 72.0 standard drinks per week. He reports that he does not use drugs.  No Known Allergies  Family History  Problem Relation Age of Onset   Lung cancer Mother    Cancer Father    Heart attack Brother      Prior to Admission medications   Medication Sig Start Date End Date Taking? Authorizing Provider  furosemide (LASIX) 20 MG tablet Take 3 tablets (60 mg total) by mouth daily. 03/16/21 04/15/21  Lin Landsman, MD  nadolol (CORGARD) 20 MG tablet Take 1 tablet (20 mg total) by mouth daily. 03/16/21 04/15/21  Lin Landsman, MD  spironolactone (ALDACTONE) 50 MG tablet Take 3 tablets (150 mg total) by mouth daily. Patient not taking: Reported on 04/01/2021 03/16/21 04/15/21  Lin Landsman, MD  SV VITAMIN B-12 ER 1000 MCG TBCR Take 1 tablet by mouth daily. 05/03/21  [provider]  Vitamin D, Ergocalciferol, (DRISDOL) 1.25 MG (50000 UNIT) CAPS capsule Take 1 capsule (50,000 Units total) by mouth every 7 (seven) days. Patient not taking: Reported on 04/01/2021 03/17/21   Lin Landsman, MD    Physical Exam: Vitals:   08/23/21 2128 08/23/21 2143 08/23/21 2200 08/23/21 2243  BP: (!) 108/56 (!) 102/59 (!) 100/56 (!) 94/55  Pulse: (!) 112 (!) 113 (!) 113 (!) 110  Resp: 18 20 (!)  25 18  Temp: 99.7 F (37.6 C) (S) 100 F (37.8 C)  98.9 F (37.2 C)  TempSrc: Oral Oral  Oral  SpO2:  100% 100% 100%  Weight:      Height:          Constitutional: Acutely ill looking, no acute distress Vitals:   08/23/21 2128 08/23/21 2143 08/23/21 2200 08/23/21 2243  BP: (!) 108/56 (!) 102/59 (!) 100/56 (!) 94/55  Pulse: (!) 112 (!) 113 (!) 113 (!) 110  Resp: 18 20 (!) 25 18  Temp: 99.7 F (37.6 C) (S) 100 F (37.8 C)  98.9 F (37.2 C)  TempSrc: Oral Oral  Oral  SpO2:  100% 100% 100%  Weight:      Height:       Eyes: PERRL, lids and conjunctivae pale ENMT: Mucous membranes are moist. Posterior pharynx clear of any exudate or lesions.Normal dentition.  Neck: normal, supple, no masses, no thyromegaly Respiratory: clear to auscultation bilaterally, no wheezing, no crackles. Normal respiratory effort. No accessory muscle use.  Cardiovascular: Sinus tachycardia, no murmurs / rubs / gallops. No extremity edema. 2+ pedal pulses. No carotid bruits.  Abdomen: Mildly distended no tenderness, no masses palpated. No hepatosplenomegaly. Bowel sounds positive.  Musculoskeletal: no clubbing / cyanosis. No joint deformity upper and lower extremities. Good ROM, no contractures. Normal muscle tone.  Skin: no rashes, lesions, ulcers. No induration Neurologic: CN 2-12 grossly intact. Sensation intact, DTR normal. Strength 5/5 in all 4.  Psychiatric: Normal judgment and insight. Alert and oriented x 3. Normal mood.     Labs on Admission: I have personally reviewed following labs and imaging studies  CBC: Recent Labs  Lab 08/23/21 2006  WBC 10.8*  NEUTROABS 7.6  HGB 6.6*  HCT 19.4*  MCV 84.7  PLT 502*   Basic Metabolic Panel: Recent Labs  Lab 08/23/21 2006  NA 134*  K 5.2*  CL 108  CO2 21*  GLUCOSE 197*  BUN 28*  CREATININE 0.99  CALCIUM 7.8*  MG 1.5*   GFR: Estimated Creatinine Clearance: 99.6 mL/min (by C-G formula based on SCr of 0.99 mg/dL). Liver Function  Tests: Recent Labs  Lab 08/23/21 2006  AST 72*  ALT 32  ALKPHOS 72  BILITOT 2.2*  PROT 5.9*  ALBUMIN 2.1*   No results for input(s): LIPASE, AMYLASE in the last 168 hours. No results for input(s): AMMONIA in the last 168 hours. Coagulation Profile: Recent Labs  Lab 08/23/21 2006  INR 1.4*   Cardiac Enzymes: No results for input(s): CKTOTAL, CKMB, CKMBINDEX, TROPONINI in the last 168 hours. BNP (last 3 results) No results for input(s): PROBNP in the last 8760 hours. HbA1C: No results for input(s): HGBA1C in the last 72 hours. CBG: No results for input(s): GLUCAP in the last 168 hours. Lipid Profile: No results for input(s): CHOL, HDL, LDLCALC, TRIG, CHOLHDL, LDLDIRECT in the last 72 hours. Thyroid Function Tests: No results for input(s): TSH, T4TOTAL, FREET4, T3FREE, THYROIDAB in the last 72 hours. Anemia Panel: No results for input(s): VITAMINB12,  FOLATE, FERRITIN, TIBC, IRON, RETICCTPCT in the last 72 hours. Urine analysis:    Component Value Date/Time   COLORURINE YELLOW (A) 11/09/2016 1849   APPEARANCEUR CLEAR (A) 11/09/2016 1849   LABSPEC 1.018 11/09/2016 1849   PHURINE 6.0 11/09/2016 1849   GLUCOSEU NEGATIVE 11/09/2016 1849   HGBUR NEGATIVE 11/09/2016 1849   BILIRUBINUR NEGATIVE 11/09/2016 1849   KETONESUR NEGATIVE 11/09/2016 1849   PROTEINUR NEGATIVE 11/09/2016 1849   NITRITE NEGATIVE 11/09/2016 1849   LEUKOCYTESUR NEGATIVE 11/09/2016 1849   Sepsis Labs: @LABRCNTIP (procalcitonin:4,lacticidven:4) ) Recent Results (from the past 240 hour(s))  Resp Panel by RT-PCR (Flu A&B, Covid) Nasopharyngeal Swab     Status: None   Collection Time: 08/23/21  8:12 PM   Specimen: Nasopharyngeal Swab; Nasopharyngeal(NP) swabs in vial transport medium  Result Value Ref Range Status   SARS Coronavirus 2 by RT PCR NEGATIVE NEGATIVE Final    Comment: (NOTE) SARS-CoV-2 target nucleic acids are NOT DETECTED.  The SARS-CoV-2 RNA is generally detectable in upper  respiratory specimens during the acute phase of infection. The lowest concentration of SARS-CoV-2 viral copies this assay can detect is 138 copies/mL. A negative result does not preclude SARS-Cov-2 infection and should not be used as the sole basis for treatment or other patient management decisions. A negative result may occur with  improper specimen collection/handling, submission of specimen other than nasopharyngeal swab, presence of viral mutation(s) within the areas targeted by this assay, and inadequate number of viral copies(<138 copies/mL). A negative result must be combined with clinical observations, patient history, and epidemiological information. The expected result is Negative.  Fact Sheet for Patients:  EntrepreneurPulse.com.au  Fact Sheet for Healthcare Providers:  IncredibleEmployment.be  This test is no t yet approved or cleared by the [place] FDA and  has been authorized for detection and/or diagnosis of SARS-CoV-2 by FDA under an Emergency Use Authorization (EUA). This EUA will remain  in effect (meaning this test can be used) for the duration of the COVID-19 declaration under Section 564(b)(1) of the Act, 21 U.S.C.section 360bbb-3(b)(1), unless the authorization is terminated  or revoked sooner.       Influenza A by PCR NEGATIVE NEGATIVE Final   Influenza B by PCR NEGATIVE NEGATIVE Final    Comment: (NOTE) The Xpert Xpress SARS-CoV-2/FLU/RSV plus assay is intended as an aid in the diagnosis of influenza from Nasopharyngeal swab specimens and should not be used as a sole basis for treatment. Nasal washings and aspirates are unacceptable for Xpert Xpress SARS-CoV-2/FLU/RSV testing.  Fact Sheet for Patients: EntrepreneurPulse.com.au  Fact Sheet for Healthcare Providers: IncredibleEmployment.be  This test is not yet approved or cleared by the [place] FDA and has been  authorized for detection and/or diagnosis of SARS-CoV-2 by FDA under an Emergency Use Authorization (EUA). This EUA will remain in effect (meaning this test can be used) for the duration of the COVID-19 declaration under Section 564(b)(1) of the Act, 21 U.S.C. section 360bbb-3(b)(1), unless the authorization is terminated or revoked.  Performed at [facility], [address], [place], [place] [address]      Radiological Exams on Admission: No results found.  EKG: Independently reviewed.  Sinus tachycardia otherwise no significant findings  Assessment/Plan Principal Problem:   Secondary esophageal varices with bleeding (HCC) Active Problems:   Alcohol abuse   Acute blood loss anemia   Alcoholic cirrhosis of liver without ascites (HCC)   Hyperkalemia   Hypomagnesemia   Thrombocytopenia (HCC)     #1 acute upper GI bleed: Most  likely variceal bleed.  Patient started on octreotide.  Admit to stepdown unit.  Serial H&H every 6 hours.  GI consulted.  Monitor closely.  Continue blood transfusion.  #2 history of alcohol abuse: Denies active alcohol right now.  Initiate CIWA protocol if symptoms of withdrawal stopped  #3 acute blood loss anemia: Secondary to acute blood loss.  Continue H&H.  #4 hyperkalemia: Probably reactive.  Monitor closely.  Probably due to hemolysis.  #5 hypomagnesemia: Secondary to alcoholism.  Replete.  #6 thrombocytopenia: Platelets 104.  Continue to monitor.  Secondary to cirrhosis.    DVT prophylaxis: SCD Code Status: Full code Family Communication: No family at bedside Disposition Plan: Home Consults called: GI Dr. Vicente Males Admission status: Inpatient  Severity of Illness: The appropriate patient status for this patient is INPATIENT. Inpatient status is judged to be reasonable and necessary in order to provide the required intensity of service to ensure the patient's safety. The patient's presenting symptoms, physical exam findings, and  initial radiographic and laboratory data in the context of their chronic comorbidities is felt to place them at high risk for further clinical deterioration. Furthermore, it is not anticipated that the patient will be medically stable for discharge from the hospital within 2 midnights of admission. The following factors support the patient status of inpatient.   " The patient's presenting symptoms include vomiting blood. " The worrisome physical exam findings include sinus tachycardia with follow. " The initial radiographic and laboratory data are worrisome because of hemoglobin 6.6. " The chronic co-morbidities include alcoholism.   * I certify that at the point of admission it is my clinical judgment that the patient will require inpatient hospital care spanning beyond 2 midnights from the point of admission due to high intensity of service, high risk for further deterioration and high frequency of surveillance required.Barbette Merino MD [facility] Pager [phone]  If 7PM-7AM, please contact night-coverage [email or web address] Password [email or web address]  08/23/2021, 10:47 PM

## 2021-08-23 NOTE — ED Provider Notes (Signed)
[facility] Emergency Department Provider Note  ____________________________________________   Event Date/Time   First MD Initiated Contact with Patient 08/23/21 1959     (approximate)  I have reviewed the triage vital signs and the nursing notes.   HISTORY  Chief Complaint Hematemesis    HPI Montray Kliebert is a 55 y.o. male  with h/o esophageal varices here with hematemesis.  Patient reports he woke up this morning and had 3 episodes of grossly bloody emesis.  He felt mildly lightheaded afterwards.  He also noticed dark, black stool.  Throughout the day today, he had black stool as well, but no overt abdominal pain.  This afternoon, he had 2 additional episodes of grossly bloody emesis.  This started and remained bloody.  He subsequent presents for evaluation.  States he feels now back to baseline.  Denies any abdominal pain.  He states he had some black stool a week or 2 ago but this resolved spontaneously.  He has a history of varices.  Denies any blood thinner use.  No current chest pain.  No current abdominal pain.       Past Medical History:  Diagnosis Date   GIB (gastrointestinal bleeding)    Hypertension    IDA (iron deficiency anemia) 08/24/2020   PUD (peptic ulcer disease)     Patient Active Problem List   Diagnosis Date Noted   Esophageal varices without bleeding ([place])    Screening for colon cancer    Alcoholic cirrhosis of liver without ascites ([place])    Alcohol abuse 02/17/2021   Hyperglycemia 02/17/2021   Hypovolemic shock ([place]) 02/17/2021   Acute blood loss anemia 02/17/2021   Secondary esophageal varices with bleeding ([facility])    Pre-op evaluation    Elevated troponin    Upper GI bleed 02/16/2021   Anemia 02/16/2021   IDA (iron deficiency anemia) 08/24/2020   Alcohol use 08/24/2020   Transaminitis 08/24/2020   GIB (gastrointestinal bleeding) 06/08/2020    Past Surgical History:  Procedure Laterality Date   COLONOSCOPY WITH  PROPOFOL N/A 04/01/2021   Procedure: COLONOSCOPY WITH PROPOFOL;  Surgeon: Lin Landsman, MD;  Location: [facility] ENDOSCOPY;  Service: Gastroenterology;  Laterality: N/A;   ESOPHAGOGASTRODUODENOSCOPY (EGD) WITH PROPOFOL N/A 02/17/2021   Procedure: ESOPHAGOGASTRODUODENOSCOPY (EGD) WITH PROPOFOL;  Surgeon: Lin Landsman, MD;  Location: [facility] ENDOSCOPY;  Service: Gastroenterology;  Laterality: N/A;   ESOPHAGOGASTRODUODENOSCOPY (EGD) WITH PROPOFOL N/A 04/01/2021   Procedure: ESOPHAGOGASTRODUODENOSCOPY (EGD) WITH PROPOFOL;  Surgeon: Lin Landsman, MD;  Location: [facility] ENDOSCOPY;  Service: Gastroenterology;  Laterality: N/A;    Prior to Admission medications   Medication Sig Start Date End Date Taking? Authorizing Provider  furosemide (LASIX) 20 MG tablet Take 3 tablets (60 mg total) by mouth daily. 03/16/21 04/15/21  Lin Landsman, MD  nadolol (CORGARD) 20 MG tablet Take 1 tablet (20 mg total) by mouth daily. 03/16/21 04/15/21  Lin Landsman, MD  spironolactone (ALDACTONE) 50 MG tablet Take 3 tablets (150 mg total) by mouth daily. Patient not taking: Reported on 04/01/2021 03/16/21 04/15/21  Lin Landsman, MD  SV VITAMIN B-12 ER 1000 MCG TBCR Take 1 tablet by mouth daily. 05/03/21   [provider]  Vitamin D, Ergocalciferol, (DRISDOL) 1.25 MG (50000 UNIT) CAPS capsule Take 1 capsule (50,000 Units total) by mouth every 7 (seven) days. Patient not taking: Reported on 04/01/2021 03/17/21   Lin Landsman, MD    Allergies Patient has no known allergies.  Family History  Problem Relation Age  of Onset   Lung cancer Mother    Cancer Father    Heart attack Brother     Social History Social History   Tobacco Use   Smoking status: Never   Smokeless tobacco: Never  Vaping Use   Vaping Use: Never used  Substance Use Topics   Alcohol use: Not Currently    Alcohol/week: 72.0 standard drinks    Types: 72 Cans of beer per week   Drug use: Never    Review of Systems   Review of Systems  Constitutional:  Negative for chills, fatigue and fever.  HENT:  Negative for sore throat.   Respiratory:  Negative for shortness of breath.   Cardiovascular:  Negative for chest pain.  Gastrointestinal:  Positive for blood in stool and vomiting. Negative for abdominal pain.  Genitourinary:  Negative for flank pain.  Musculoskeletal:  Negative for neck pain.  Skin:  Negative for rash and wound.  Allergic/Immunologic: Negative for immunocompromised state.  Neurological:  Positive for light-headedness. Negative for weakness and numbness.  Hematological:  Does not bruise/bleed easily.  All other systems reviewed and are negative.   ____________________________________________  PHYSICAL EXAM:      VITAL SIGNS: ED Triage Vitals  Enc Vitals Group     BP 08/23/21 1954 117/66     Pulse Rate 08/23/21 1954 (!) 120     Resp 08/23/21 1954 18     Temp 08/23/21 1954 99.1 F (37.3 C)     Temp Source 08/23/21 1954 Oral     SpO2 08/23/21 1953 98 %     Weight 08/23/21 1954 230 lb (104.3 kg)     Height 08/23/21 1954 5\' 10"  (1.778 m)     Head Circumference --      Peak Flow --      Pain Score 08/23/21 1954 0     Pain Loc --      Pain Edu? --      Excl. in [name]? --      Physical Exam Vitals and nursing note reviewed.  Constitutional:      General: He is not in acute distress.    Appearance: He is well-developed.  HENT:     Head: Normocephalic and atraumatic.  Eyes:     Conjunctiva/sclera: Conjunctivae normal.  Cardiovascular:     Rate and Rhythm: Regular rhythm. Tachycardia present.     Heart sounds: Normal heart sounds. No murmur heard.   No friction rub.  Pulmonary:     Effort: Pulmonary effort is normal. No respiratory distress.     Breath sounds: Normal breath sounds. No wheezing or rales.  Abdominal:     General: There is no distension.     Palpations: Abdomen is soft.     Tenderness: There is no abdominal tenderness.  Musculoskeletal:     Cervical back:  Neck supple.  Skin:    General: Skin is warm.     Capillary Refill: Capillary refill takes less than 2 seconds.  Neurological:     Mental Status: He is alert and oriented to person, place, and time.     Motor: No abnormal muscle tone.      ____________________________________________   LABS (all labs ordered are listed, but only abnormal results are displayed)  Labs Reviewed  CBC WITH DIFFERENTIAL/PLATELET - Abnormal; Notable for the following components:      Result Value   WBC 10.8 (*)    RBC 2.29 (*)    Hemoglobin 6.6 (*)    HCT 19.4 (*)  RDW 18.9 (*)    Platelets 104 (*)    Monocytes Absolute 1.4 (*)    Abs Immature Granulocytes 0.08 (*)    All other components within normal limits  COMPREHENSIVE METABOLIC PANEL - Abnormal; Notable for the following components:   Sodium 134 (*)    Potassium 5.2 (*)    CO2 21 (*)    Glucose, Bld 197 (*)    BUN 28 (*)    Calcium 7.8 (*)    Total Protein 5.9 (*)    Albumin 2.1 (*)    AST 72 (*)    Total Bilirubin 2.2 (*)    All other components within normal limits  PROTIME-INR - Abnormal; Notable for the following components:   Prothrombin Time 17.1 (*)    INR 1.4 (*)    All other components within normal limits  LACTIC ACID, PLASMA - Abnormal; Notable for the following components:   Lactic Acid, Venous 3.2 (*)    All other components within normal limits  MAGNESIUM - Abnormal; Notable for the following components:   Magnesium 1.5 (*)    All other components within normal limits  RESP PANEL BY RT-PCR (FLU A&B, COVID) ARPGX2  LACTIC ACID, PLASMA  TYPE AND SCREEN  PREPARE RBC (CROSSMATCH)    ____________________________________________  EKG: Sinus tachycardia, ventricular rate 118.  PR 119, QRS 80, QTc 477.  No acute ST elevations or depressions. ________________________________________  RADIOLOGY All imaging, including plain films, CT scans, and ultrasounds, independently reviewed by me, and interpretations confirmed  via formal radiology reads.  ED MD interpretation:     Official radiology report(s): No results found.  ____________________________________________  PROCEDURES   Procedure(s) performed (including Critical Care):  .Critical Care Performed by: Duffy Bruce, MD Authorized by: Duffy Bruce, MD   Critical care provider statement:    Critical care time (minutes):  35   Critical care time was exclusive of:  Separately billable procedures and treating other patients and teaching time   Critical care was necessary to treat or prevent imminent or life-threatening deterioration of the following conditions:  Circulatory failure, cardiac failure and respiratory failure   Critical care was time spent personally by me on the following activities:  Development of treatment plan with patient or surrogate, discussions with consultants, evaluation of patient's response to treatment, examination of patient, obtaining history from patient or surrogate, ordering and performing treatments and interventions, ordering and review of laboratory studies, ordering and review of radiographic studies, pulse oximetry, re-evaluation of patient's condition and review of old charts   I assumed direction of critical care for this patient from another provider in my specialty: no    ____________________________________________  INITIAL IMPRESSION / MDM / [place] / ED COURSE  As part of my medical decision making, I reviewed the following data within the [facility] notes reviewed and incorporated, Old chart reviewed, Notes from prior ED visits, and [place] Controlled Substance Database       *Allan Minotti was evaluated in Emergency Department on 08/23/2021 for the symptoms described in the history of present illness. He was evaluated in the context of the global COVID-19 pandemic, which necessitated consideration that the patient might be at risk for infection with the SARS-CoV-2  virus that causes COVID-19. Institutional protocols and algorithms that pertain to the evaluation of patients at risk for COVID-19 are in a state of rapid change based on information released by regulatory bodies including the CDC and federal and state organizations. These policies and  algorithms were followed during the patient's care in the ED.  Some ED evaluations and interventions may be delayed as a result of limited staffing during the pandemic.*     Medical Decision Making: 55 year old male here with hematemesis which I suspect is due to esophageal varices.  Reviewed his previous EGDs which show esophageal varices.  He is tachycardic here with hemoglobin 6.6.  Will transfuse 1 unit empirically.  He has had no hematemesis in the ED and is otherwise asymptomatic.  Will start empirically on Protonix, octreotide, and give empiric Rocephin.  Type and screen sent and he has been started on 1 unit.  Discussed with Dr. Vicente Males of GI.  Will admit to medicine.  Otherwise, he is on no anticoagulation.  CMP at baseline.  White count 10.8.  INR 1.4 likely due to his underlying liver disease.  ____________________________________________  FINAL CLINICAL IMPRESSION(S) / ED DIAGNOSES  Final diagnoses:  Secondary esophageal varices with bleeding ([place])     MEDICATIONS GIVEN DURING THIS VISIT:  Medications  octreotide (SANDOSTATIN) 2 mcg/mL load via infusion 50 mcg (50 mcg Intravenous Bolus from Bag 08/23/21 2105)    And  octreotide (SANDOSTATIN) 500 mcg in sodium chloride 0.9 % 250 mL (2 mcg/mL) infusion (50 mcg/hr Intravenous New Bag/Given 08/23/21 2105)  pantoprazole (PROTONIX) 80 mg /NS 100 mL IVPB (has no administration in time range)  pantoprozole (PROTONIX) 80 mg /NS 100 mL infusion (8 mg/hr Intravenous New Bag/Given 08/23/21 2044)  0.9 %  sodium chloride infusion (0 mL/hr Intravenous Stopped 08/23/21 2106)  ondansetron (ZOFRAN) injection 4 mg (4 mg Intravenous Given 08/23/21 2020)  cefTRIAXone  (ROCEPHIN) 2 g in sodium chloride 0.9 % 100 mL IVPB (0 g Intravenous Stopped 08/23/21 2053)     ED Discharge Orders     None        Note:  This document was prepared using Dragon voice recognition software and may include unintentional dictation errors.   Duffy Bruce, MD 08/23/21 2119

## 2021-08-23 NOTE — ED Notes (Signed)
Blood bank contacted about blood transfusion - T& S processing at this time.

## 2021-08-24 ENCOUNTER — Inpatient Hospital Stay: Payer: BC Managed Care – PPO | Admitting: Anesthesiology

## 2021-08-24 ENCOUNTER — Encounter
Admission: EM | Disposition: A | Discharge status: Rehabilitation Facility | Payer: Self-pay | Source: Home / Self Care | Attending: Internal Medicine

## 2021-08-24 ENCOUNTER — Encounter: Payer: Self-pay | Admitting: Internal Medicine

## 2021-08-24 DIAGNOSIS — K7030 Alcoholic cirrhosis of liver without ascites: Secondary | ICD-10-CM | POA: Diagnosis not present

## 2021-08-24 DIAGNOSIS — D62 Acute posthemorrhagic anemia: Secondary | ICD-10-CM | POA: Diagnosis not present

## 2021-08-24 DIAGNOSIS — I8511 Secondary esophageal varices with bleeding: Secondary | ICD-10-CM | POA: Diagnosis not present

## 2021-08-24 DIAGNOSIS — F1010 Alcohol abuse, uncomplicated: Secondary | ICD-10-CM

## 2021-08-24 HISTORY — PX: ESOPHAGOGASTRODUODENOSCOPY (EGD) WITH PROPOFOL: SHX5813

## 2021-08-24 LAB — CBC
HCT: 21.2 % — ABNORMAL LOW (ref 39.0–52.0)
HCT: 25.5 % — ABNORMAL LOW (ref 39.0–52.0)
HCT: 26.6 % — ABNORMAL LOW (ref 39.0–52.0)
HCT: 27 % — ABNORMAL LOW (ref 39.0–52.0)
Hemoglobin: 7.2 g/dL — ABNORMAL LOW (ref 13.0–17.0)
Hemoglobin: 9 g/dL — ABNORMAL LOW (ref 13.0–17.0)
Hemoglobin: 9 g/dL — ABNORMAL LOW (ref 13.0–17.0)
Hemoglobin: 9.1 g/dL — ABNORMAL LOW (ref 13.0–17.0)
MCH: 28.8 pg (ref 26.0–34.0)
MCH: 28.8 pg (ref 26.0–34.0)
MCH: 29.4 pg (ref 26.0–34.0)
MCH: 30.2 pg (ref 26.0–34.0)
MCHC: 33.3 g/dL (ref 30.0–36.0)
MCHC: 33.8 g/dL (ref 30.0–36.0)
MCHC: 34 g/dL (ref 30.0–36.0)
MCHC: 35.7 g/dL (ref 30.0–36.0)
MCV: 84.7 fL (ref 80.0–100.0)
MCV: 85 fL (ref 80.0–100.0)
MCV: 86.5 fL (ref 80.0–100.0)
MCV: 86.5 fL (ref 80.0–100.0)
Platelets: 81 10*3/uL — ABNORMAL LOW (ref 150–400)
Platelets: 92 10*3/uL — ABNORMAL LOW (ref 150–400)
Platelets: 93 10*3/uL — ABNORMAL LOW (ref 150–400)
Platelets: 98 10*3/uL — ABNORMAL LOW (ref 150–400)
RBC: 2.45 MIL/uL — ABNORMAL LOW (ref 4.22–5.81)
RBC: 3.01 MIL/uL — ABNORMAL LOW (ref 4.22–5.81)
RBC: 3.12 MIL/uL — ABNORMAL LOW (ref 4.22–5.81)
RBC: 3.13 MIL/uL — ABNORMAL LOW (ref 4.22–5.81)
RDW: 16.4 % — ABNORMAL HIGH (ref 11.5–15.5)
RDW: 17 % — ABNORMAL HIGH (ref 11.5–15.5)
RDW: 17.2 % — ABNORMAL HIGH (ref 11.5–15.5)
RDW: 17.8 % — ABNORMAL HIGH (ref 11.5–15.5)
WBC: 10.4 10*3/uL (ref 4.0–10.5)
WBC: 11.7 10*3/uL — ABNORMAL HIGH (ref 4.0–10.5)
WBC: 8 10*3/uL (ref 4.0–10.5)
WBC: 9.6 10*3/uL (ref 4.0–10.5)
nRBC: 0 % (ref 0.0–0.2)
nRBC: 0 % (ref 0.0–0.2)
nRBC: 0 % (ref 0.0–0.2)
nRBC: 0.2 % (ref 0.0–0.2)

## 2021-08-24 LAB — URINE DRUG SCREEN, QUALITATIVE (ARMC ONLY)
Amphetamines, Ur Screen: NOT DETECTED
Barbiturates, Ur Screen: NOT DETECTED
Benzodiazepine, Ur Scrn: NOT DETECTED
Cannabinoid 50 Ng, Ur ~~LOC~~: NOT DETECTED
Cocaine Metabolite,Ur ~~LOC~~: NOT DETECTED
MDMA (Ecstasy)Ur Screen: NOT DETECTED
Methadone Scn, Ur: NOT DETECTED
Opiate, Ur Screen: NOT DETECTED
Phencyclidine (PCP) Ur S: NOT DETECTED
Tricyclic, Ur Screen: NOT DETECTED

## 2021-08-24 LAB — COMPREHENSIVE METABOLIC PANEL
ALT: 30 U/L (ref 0–44)
AST: 59 U/L — ABNORMAL HIGH (ref 15–41)
Albumin: 2 g/dL — ABNORMAL LOW (ref 3.5–5.0)
Alkaline Phosphatase: 53 U/L (ref 38–126)
Anion gap: 7 (ref 5–15)
BUN: 42 mg/dL — ABNORMAL HIGH (ref 6–20)
CO2: 22 mmol/L (ref 22–32)
Calcium: 7.5 mg/dL — ABNORMAL LOW (ref 8.9–10.3)
Chloride: 109 mmol/L (ref 98–111)
Creatinine, Ser: 1.22 mg/dL (ref 0.61–1.24)
GFR, Estimated: >60 mL/min (ref >60)
Glucose, Bld: 147 mg/dL — ABNORMAL HIGH (ref 70–99)
Potassium: 5.2 mmol/L — ABNORMAL HIGH (ref 3.5–5.1)
Sodium: 138 mmol/L (ref 135–145)
Total Bilirubin: 1.9 mg/dL — ABNORMAL HIGH (ref 0.3–1.2)
Total Protein: 5.5 g/dL — ABNORMAL LOW (ref 6.5–8.1)

## 2021-08-24 LAB — MAGNESIUM: Magnesium: 1.7 mg/dL (ref 1.7–2.4)

## 2021-08-24 LAB — PREPARE RBC (CROSSMATCH)

## 2021-08-24 LAB — GLUCOSE, CAPILLARY: Glucose-Capillary: 131 mg/dL — ABNORMAL HIGH (ref 70–99)

## 2021-08-24 LAB — POTASSIUM: Potassium: 4.6 mmol/L (ref 3.5–5.1)

## 2021-08-24 LAB — MRSA NEXT GEN BY PCR, NASAL: MRSA by PCR Next Gen: NOT DETECTED

## 2021-08-24 SURGERY — ESOPHAGOGASTRODUODENOSCOPY (EGD) WITH PROPOFOL
Anesthesia: General

## 2021-08-24 MED ORDER — PROPOFOL 10 MG/ML IV BOLUS
INTRAVENOUS | Status: DC | PRN
  Administered 2021-08-24: 60 mg via INTRAVENOUS

## 2021-08-24 MED ORDER — FENTANYL CITRATE (PF) 100 MCG/2ML IJ SOLN
INTRAMUSCULAR | Status: DC | PRN
  Administered 2021-08-24 (×2): 50 ug via INTRAVENOUS

## 2021-08-24 MED ORDER — MORPHINE SULFATE (PF) 2 MG/ML IV SOLN
1.0000 mg | INTRAVENOUS | Status: DC | PRN
  Administered 2021-08-27: 1 mg via INTRAVENOUS
  Filled 2021-08-24: qty 1

## 2021-08-24 MED ORDER — FENTANYL CITRATE (PF) 100 MCG/2ML IJ SOLN
INTRAMUSCULAR | Status: AC
  Filled 2021-08-24: qty 2

## 2021-08-24 MED ORDER — CHLORHEXIDINE GLUCONATE CLOTH 2 % EX PADS
6.0000 | MEDICATED_PAD | Freq: Every day | CUTANEOUS | Status: DC
Start: 2021-08-24 — End: 2021-08-31
  Administered 2021-08-24 – 2021-08-30 (×7): 6 via TOPICAL

## 2021-08-24 MED ORDER — SODIUM CHLORIDE 0.9% IV SOLUTION
Freq: Once | INTRAVENOUS | Status: AC
  Filled 2021-08-24: qty 250

## 2021-08-24 MED ORDER — MIDAZOLAM HCL 2 MG/2ML IJ SOLN
INTRAMUSCULAR | Status: AC
  Filled 2021-08-24: qty 2

## 2021-08-24 MED ORDER — LORAZEPAM 2 MG/ML IJ SOLN
1.0000 mg | INTRAMUSCULAR | Status: DC | PRN
  Administered 2021-08-25 – 2021-08-26 (×7): 2 mg via INTRAVENOUS
  Filled 2021-08-24 (×7): qty 1

## 2021-08-24 MED ORDER — SODIUM CHLORIDE 0.9 % IV SOLN: INTRAVENOUS | Status: DC

## 2021-08-24 MED ORDER — MIDAZOLAM HCL 2 MG/2ML IJ SOLN
INTRAMUSCULAR | Status: DC | PRN
Start: 2021-08-24 — End: 2021-08-24
  Administered 2021-08-24: 2 mg via INTRAVENOUS

## 2021-08-24 MED ORDER — NALOXONE HCL 0.4 MG/ML IJ SOLN
0.4000 mg | INTRAMUSCULAR | Status: DC | PRN
  Administered 2021-08-24: 0.4 mg via INTRAVENOUS
  Filled 2021-08-24: qty 1

## 2021-08-24 MED ORDER — LIDOCAINE HCL (CARDIAC) PF 100 MG/5ML IV SOSY
PREFILLED_SYRINGE | INTRAVENOUS | Status: DC | PRN
  Administered 2021-08-24: 100 mg via INTRAVENOUS

## 2021-08-24 MED ORDER — DOCUSATE SODIUM 100 MG PO CAPS: 200.0000 mg | ORAL_CAPSULE | Freq: Two times a day (BID) | ORAL | Status: DC | PRN

## 2021-08-24 MED ORDER — SODIUM CHLORIDE 0.9 % IV SOLN
8.0000 mg | Freq: Three times a day (TID) | INTRAVENOUS | Status: DC | PRN
  Filled 2021-08-24: qty 4

## 2021-08-24 MED ORDER — GLYCOPYRROLATE 0.2 MG/ML IJ SOLN
INTRAMUSCULAR | Status: DC | PRN
  Administered 2021-08-24: .2 mg via INTRAVENOUS

## 2021-08-24 MED ORDER — SODIUM CHLORIDE 0.9 % IV SOLN
2.0000 g | INTRAVENOUS | Status: DC
  Administered 2021-08-24 – 2021-08-27 (×4): 2 g via INTRAVENOUS
  Filled 2021-08-24 (×2): qty 2
  Filled 2021-08-24: qty 20
  Filled 2021-08-24 (×2): qty 2

## 2021-08-24 MED ORDER — PROPOFOL 500 MG/50ML IV EMUL
INTRAVENOUS | Status: DC | PRN
  Administered 2021-08-24: 150 ug/kg/min via INTRAVENOUS

## 2021-08-24 MED ORDER — SODIUM CHLORIDE 0.9 % IV SOLN: 1.0000 g | INTRAVENOUS | Status: DC

## 2021-08-24 MED ORDER — PHENYLEPHRINE HCL (PRESSORS) 10 MG/ML IV SOLN
INTRAVENOUS | Status: DC | PRN
Start: 2021-08-24 — End: 2021-08-24
  Administered 2021-08-24 (×2): 100 ug via INTRAVENOUS
  Administered 2021-08-24: 200 ug via INTRAVENOUS

## 2021-08-24 NOTE — ED Triage Notes (Signed)
Pt presents from home via EMS with a complaints of hematemesis and "dark black" stools. Pt has a hx of alcoholism and esophageal varices. Pt denies any recent alcohol consumption. Denies nausea, CP, or SOB.

## 2021-08-24 NOTE — Transfer of Care (Signed)
Immediate Anesthesia Transfer of Care Note  Patient: Terrence Martin  Procedure(s) Performed: ESOPHAGOGASTRODUODENOSCOPY (EGD) WITH PROPOFOL  Patient Location: Endoscopy Unit  Anesthesia Type:General  Level of Consciousness: drowsy  Airway & Oxygen Therapy: Patient Spontanous Breathing  Post-op Assessment: Report given to RN and Post -op Vital signs reviewed and stable  Post vital signs: Reviewed and stable  Last Vitals:  Vitals Value Taken Time  BP 102/59 08/24/21 1727  Temp 35.8 C 08/24/21 1726  Pulse 88 08/24/21 1728  Resp 21 08/24/21 1728  SpO2 100 % 08/24/21 1728  Vitals shown include unvalidated device data.  Last Pain:  Vitals:   08/24/21 1726  TempSrc: Temporal  PainSc: Asleep         Complications: No notable events documented.

## 2021-08-24 NOTE — Anesthesia Procedure Notes (Signed)
Date/Time: 08/24/2021 5:12 PM Performed by: Lily Peer, Honor Fairbank, CRNA Pre-anesthesia Checklist: Patient identified, Emergency Drugs available, Suction available, Patient being monitored and Timeout performed Patient Re-evaluated:Patient Re-evaluated prior to induction Oxygen Delivery Method: Simple face mask Induction Type: IV induction

## 2021-08-24 NOTE — ED Notes (Signed)
Informed RN bed assigned 

## 2021-08-24 NOTE — Anesthesia Preprocedure Evaluation (Signed)
Anesthesia Evaluation  Patient identified by MRN, date of birth, ID band Patient awake    Reviewed: Allergy & Precautions, H&P , NPO status , Patient's Chart, lab work & pertinent test results, reviewed documented beta blocker date and time   History of Anesthesia Complications Negative for: history of anesthetic complications  Airway Mallampati: III  TM Distance: >3 FB Neck ROM: full    Dental  (+) Dental Advidsory Given, Missing, Teeth Intact   Pulmonary neg shortness of breath, asthma (as a child) , neg sleep apnea, neg COPD, Recent URI  (COVID in early February),    Pulmonary exam normal breath sounds clear to auscultation       Cardiovascular Exercise Tolerance: Good hypertension, Normal cardiovascular exam Rhythm:regular Rate:Normal     Neuro/Psych PSYCHIATRIC DISORDERS negative neurological ROS     GI/Hepatic PUD, neg GERD  ,(+) Cirrhosis   Esophageal Varices    , Severe portal hypertensive gastropathy hematemesis, varices   Endo/Other  negative endocrine ROS  Renal/GU negative Renal ROS  negative genitourinary   Musculoskeletal   Abdominal   Peds negative pediatric ROS (+)  Hematology  (+) anemia , Acute on chronic anemia  Thrombocytopenia   Anesthesia Other Findings Pt with variceal bleed with hemoglobin 6.6 on admission from 8.8 since 4/22.  He received 2 units of PRBCs and is now on octreotide and PPI.  Hyperkalemia Hypomagnesemia    Past Medical History: No date: GIB (gastrointestinal bleeding) 08/24/2020: IDA (iron deficiency anemia) No date: PUD (peptic ulcer disease)   Reproductive/Obstetrics negative OB ROS                             Anesthesia Physical  Anesthesia Plan  ASA: III  Anesthesia Plan: General   Post-op Pain Management:    Induction: Intravenous  PONV Risk Score and Plan: 2 and TIVA and Propofol infusion  Airway Management Planned:  Natural Airway and Nasal Cannula  Additional Equipment:   Intra-op Plan:   Post-operative Plan:   Informed Consent:   Plan Discussed with:   Anesthesia Plan Comments:         Anesthesia Quick Evaluation

## 2021-08-24 NOTE — Progress Notes (Addendum)
PROGRESS NOTE   HPI was taken from Dr. Jonelle Sidle: Terrence Martin is a 55 y.o. male with medical history significant of alcohol abuse with liver cirrhosis and variceal bleed in the past, iron deficiency anemia, peptic ulcer disease, essential hypertension, who presents to the ER with 3 episode of grossly bloody emesis.  He woke up this morning with the same.  He has been feeling lightheaded dizzy since then.  Patient also reported black tarry stools throughout the day.  No abdominal pain.  He had 2 additional episode of grossly bloody's emesis again this evening and came in to the ER where he is now being evaluated.  His hemoglobin has dropped to 6.6.  No current anticoagulation.  No fever no chills.  Denied bright red blood per rectum.  Patient at this point diagnosed with variceal bleed.  GI consulted.  Started on octreotide and IV Protonix and being admitted to the hospitalist service for further evaluation..   ED Course: Temperature 100 blood pressure 94/55, pulse 122 respirate 27 oxygen sats 98% room air.  Sodium is 131 potassium 5.2 chloride 108 CO2 21 glucose 197 BUN 28 creatinine 0.99 calcium 7.8.  Magnesium 1.5.  372 albumin 2.0.  White count 10.8 hemoglobin 6.7 platelets 104.  Lactic acid 3.2.  PT 7 troponin INR 1.4.  2 units of packed red blood cells ordered in the ER patient being admitted with variceal bleed.   Vian Fluegel  LNL:[id number] DOB: 1966/07/29 DOA: 08/23/2021 PCP: Pcp, No    Assessment & Plan:   Principal Problem:   Secondary esophageal varices with bleeding ([place]) Active Problems:   Alcohol abuse   Acute blood loss anemia   Alcoholic cirrhosis of liver without ascites (HCC)   Hyperkalemia   Hypomagnesemia   Thrombocytopenia (HCC)  Likely acute upper GI bleed: etiology unclear, possibly variceal bleed.  Continue on octreotide. Continue on IV PPI. Started on rocephin for SBP prophylaxis. Monitor H&H. Pt denies cirrhosis dx. GI consulted    Hx of alcohol abuse: continue on CIWA  protocol. Alcohol cessation counseling. Last drink on 08/22/21 as per pt. Urine drug screen ordered   Acute blood loss anemia: etiology unclear, possibly secondary variceal bleed. GI consulted.    Hyperkalemia: possibly secondary to hemolysis, will repeat K level.    Hypomagnesemia: WNL    Thrombocytopenia: secondary to cirrhosis. Pt denies cirrhosis dx    DVT prophylaxis: SCDs secondary to above Code Status: full  Family Communication:  Disposition Plan: likely d/c back home   Level of care: Stepdown  Status is: Inpatient  Remains inpatient appropriate because:Ongoing diagnostic testing needed not appropriate for outpatient work up, IV treatments appropriate due to intensity of illness or inability to take PO, and Inpatient level of care appropriate due to severity of illness  Dispo: The patient is from: Home              Anticipated d/c is to: Home              Patient currently is not medically stable to d/c.   Difficult to place patient : unclear   Consultants:  GI  Procedures:   Antimicrobials: rocephin    Subjective: Pt c/o abd pain   Objective: Vitals:   08/24/21 0515 08/24/21 0530 08/24/21 0545 08/24/21 0630  BP: 108/69 103/64 116/67 104/65  Pulse: (!) 106 (!) 108 (!) 106 100  Resp: (!) 21 20 (!) 22 (!) 21  Temp:      TempSrc:      SpO2: 100%  100% 99% 99%  Weight:      Height:        Intake/Output Summary (Last 24 hours) at 08/24/2021 0816 Last data filed at 08/24/2021 0300 Gross per 24 hour  Intake 1572.51 ml  Output --  Net 1572.51 ml   Filed Weights   08/23/21 1954  Weight: 104.3 kg    Examination:  General exam: Appears calm but uncomfortable  Respiratory system: Clear to auscultation. Respiratory effort normal. Cardiovascular system: S1 & S2 +. No rubs, gallops or clicks.  Gastrointestinal system: Abdomen is obese, soft and tenderness to palpation, Normal bowel sounds heard. Central nervous system: Alert and oriented. Moves all  extremities  Psychiatry: Judgement and insight appear normal. Flat mood and affect    Data Reviewed: I have personally reviewed following labs and imaging studies  CBC: Recent Labs  Lab 08/23/21 2006 08/24/21 0010 08/24/21 0632  WBC 10.8* 11.7* 10.4  NEUTROABS 7.6  --   --   HGB 6.6* 7.2* 9.1*  HCT 19.4* 21.2* 25.5*  MCV 84.7 86.5 84.7  PLT 104* 98* 93*   Basic Metabolic Panel: Recent Labs  Lab 08/23/21 2006 08/24/21 0632  NA 134* 138  K 5.2* 5.2*  CL 108 109  CO2 21* 22  GLUCOSE 197* 147*  BUN 28* 42*  CREATININE 0.99 1.22  CALCIUM 7.8* 7.5*  MG 1.5*  --    GFR: Estimated Creatinine Clearance: 80.8 mL/min (by C-G formula based on SCr of 1.22 mg/dL). Liver Function Tests: Recent Labs  Lab 08/23/21 2006 08/24/21 0632  AST 72* 59*  ALT 32 30  ALKPHOS 72 53  BILITOT 2.2* 1.9*  PROT 5.9* 5.5*  ALBUMIN 2.1* 2.0*   No results for input(s): LIPASE, AMYLASE in the last 168 hours. No results for input(s): AMMONIA in the last 168 hours. Coagulation Profile: Recent Labs  Lab 08/23/21 2006  INR 1.4*   Cardiac Enzymes: No results for input(s): CKTOTAL, CKMB, CKMBINDEX, TROPONINI in the last 168 hours. BNP (last 3 results) No results for input(s): PROBNP in the last 8760 hours. HbA1C: No results for input(s): HGBA1C in the last 72 hours. CBG: No results for input(s): GLUCAP in the last 168 hours. Lipid Profile: No results for input(s): CHOL, HDL, LDLCALC, TRIG, CHOLHDL, LDLDIRECT in the last 72 hours. Thyroid Function Tests: No results for input(s): TSH, T4TOTAL, FREET4, T3FREE, THYROIDAB in the last 72 hours. Anemia Panel: No results for input(s): VITAMINB12, FOLATE, FERRITIN, TIBC, IRON, RETICCTPCT in the last 72 hours. Sepsis Labs: Recent Labs  Lab 08/23/21 2009 08/23/21 2320  LATICACIDVEN 3.2* 3.3*    Recent Results (from the past 240 hour(s))  Resp Panel by RT-PCR (Flu A&B, Covid) Nasopharyngeal Swab     Status: None   Collection Time: 08/23/21   8:12 PM   Specimen: Nasopharyngeal Swab; Nasopharyngeal(NP) swabs in vial transport medium  Result Value Ref Range Status   SARS Coronavirus 2 by RT PCR NEGATIVE NEGATIVE Final    Comment: (NOTE) SARS-CoV-2 target nucleic acids are NOT DETECTED.  The SARS-CoV-2 RNA is generally detectable in upper respiratory specimens during the acute phase of infection. The lowest concentration of SARS-CoV-2 viral copies this assay can detect is 138 copies/mL. A negative result does not preclude SARS-Cov-2 infection and should not be used as the sole basis for treatment or other patient management decisions. A negative result may occur with  improper specimen collection/handling, submission of specimen other than nasopharyngeal swab, presence of viral mutation(s) within the areas targeted by this assay, and  inadequate number of viral copies(<138 copies/mL). A negative result must be combined with clinical observations, patient history, and epidemiological information. The expected result is Negative.  Fact Sheet for Patients:  EntrepreneurPulse.com.au  Fact Sheet for Healthcare Providers:  IncredibleEmployment.be  This test is no t yet approved or cleared by the [place] FDA and  has been authorized for detection and/or diagnosis of SARS-CoV-2 by FDA under an Emergency Use Authorization (EUA). This EUA will remain  in effect (meaning this test can be used) for the duration of the COVID-19 declaration under Section 564(b)(1) of the Act, 21 U.S.C.section 360bbb-3(b)(1), unless the authorization is terminated  or revoked sooner.       Influenza A by PCR NEGATIVE NEGATIVE Final   Influenza B by PCR NEGATIVE NEGATIVE Final    Comment: (NOTE) The Xpert Xpress SARS-CoV-2/FLU/RSV plus assay is intended as an aid in the diagnosis of influenza from Nasopharyngeal swab specimens and should not be used as a sole basis for treatment. Nasal washings and aspirates  are unacceptable for Xpert Xpress SARS-CoV-2/FLU/RSV testing.  Fact Sheet for Patients: EntrepreneurPulse.com.au  Fact Sheet for Healthcare Providers: IncredibleEmployment.be  This test is not yet approved or cleared by the [place] FDA and has been authorized for detection and/or diagnosis of SARS-CoV-2 by FDA under an Emergency Use Authorization (EUA). This EUA will remain in effect (meaning this test can be used) for the duration of the COVID-19 declaration under Section 564(b)(1) of the Act, 21 U.S.C. section 360bbb-3(b)(1), unless the authorization is terminated or revoked.  Performed at [facility], [address] [place], [place] [address]          Radiology Studies: No results found.      Scheduled Meds: Continuous Infusions:  octreotide  (SANDOSTATIN)    IV infusion 50 mcg/hr (08/24/21 0536)   pantoprazole 8 mg/hr (08/24/21 0537)     LOS: 1 day    Time spent: 33 mins     Wyvonnia Dusky, MD [facility] Pager [phone]  If 7PM-7AM, please contact night-coverage 08/24/2021, 8:16 AM

## 2021-08-24 NOTE — Progress Notes (Signed)
..  Patient Assist/Replace for the following has been terminated. Medication: Feraheme (ferumoxytol) Reason for Termination: Patient has [place] no longer Self Pay. Last DOS: 09/25/2020. Marland KitchenJuan Quam, CPhT IV Drug Replacement Specialist [place] Phone: [phone]

## 2021-08-24 NOTE — Consult Note (Signed)
Terrence Darby, MD [address]  [place], [place] [address]  Main: [phone]  Fax: [phone]    Gastroenterology Consultation  Referring Provider:     No ref. provider found Primary Care Physician:  Pcp, No Primary Gastroenterologist:  Dr. Cephas Martin Reason for Consultation:     Decompensated alcoholic cirrhosis, hematemesis        HPI:   Terrence Martin is a 55 y.o. male referred by Dr. Merryl Hacker, No  for consultation & management of hematemesis and melena that started yesterday morning.  Patient had 3 episodes of hematemesis yesterday morning when he woke up and he had 2 additional episodes of hematemesis yesterday afternoon, symptomatic with dizziness, presented to ER.  He also reports he had melena about 1 to 2 weeks ago.  He has history of acute upper GI bleed from esophageal varices in 4/22, underwent ligation x 3, also has severe portal hypertensive gastropathy, maintained on nadolol as outpatient.  Patient also has history of volume overload, was on low-dose diuretics as outpatient.  Patient was tachycardic in the ER, maintaining his blood pressure.  Patient denies any fever, abdominal pain, nausea vomiting. Review of labs revealed drop in hemoglobin to 6.6 on admission from 8.8 since 4/22.  He received 2 units of PRBCs, hemoglobin improved to 9.1.  He has mild leukocytosis, platelets 93, INR 1.4.  Elevated BUN/creatinine 42/1.22, mildly elevated T bili 1.9.  Patient is started on PPI, octreotide drips, received ceftriaxone for SBP prophylaxis.  His potassium is 5.2  Patient reports that he ran out of nadolol, diuretics and he felt he would be fine and did not call my office.  Unfortunately, he restarted drinking alcohol during weekends.  NSAIDs: None  Antiplts/Anticoagulants/Anti thrombotics: None  GI Procedures:  EGD and colonoscopy 04/01/2021 - Normal duodenal bulb and second portion of the duodenum. - Portal hypertensive gastropathy. - Small (< 5 mm)  esophageal varices with no bleeding and no stigmata of recent bleeding. - No specimens collected.  - Three 3 to 5 mm polyps in the descending colon and in the cecum, removed with a cold snare. Resected and retrieved. - Diverticulosis in the sigmoid colon. - Non-bleeding external hemorrhoids.  DIAGNOSIS:  A. COLON POLYP, CECUM; COLD SNARE:  - MULTIPLE FRAGMENTS OF TUBULAR ADENOMA.  - NEGATIVE FOR HIGH-GRADE DYSPLASIA AND MALIGNANCY.   B. COLON POLYPS X2, DESCENDING; COLD SNARE:  - FRAGMENTS (X2) OF TUBULAR ADENOMAS.  - SINGLE FRAGMENT OF BENIGN COLONIC MUCOSA WITH SUPERFICIAL REACTIVE  CHANGES AND SMALL LYMPHOID AGGREGATE.  - NEGATIVE FOR HIGH-GRADE DYSPLASIA AND MALIGNANCY.    EGD 02/17/2021 - Normal duodenal bulb and second portion of the duodenum. - Portal hypertensive gastropathy. - Normal gastric body, incisura and antrum. - Large (> 5 mm) esophageal varices with stigmata of recent bleeding. Incompletely eradicated. Banded x 3. - No specimens collected    Past Medical History:  Diagnosis Date   GIB (gastrointestinal bleeding)    Hypertension    IDA (iron deficiency anemia) 08/24/2020   PUD (peptic ulcer disease)     Past Surgical History:  Procedure Laterality Date   COLONOSCOPY WITH PROPOFOL N/A 04/01/2021   Procedure: COLONOSCOPY WITH PROPOFOL;  Surgeon: Lin Landsman, MD;  Location: [facility] ENDOSCOPY;  Service: Gastroenterology;  Laterality: N/A;   ESOPHAGOGASTRODUODENOSCOPY (EGD) WITH PROPOFOL N/A 02/17/2021   Procedure: ESOPHAGOGASTRODUODENOSCOPY (EGD) WITH PROPOFOL;  Surgeon: Lin Landsman, MD;  Location: [facility] ENDOSCOPY;  Service: Gastroenterology;  Laterality: N/A;   ESOPHAGOGASTRODUODENOSCOPY (EGD) WITH  PROPOFOL N/A 04/01/2021   Procedure: ESOPHAGOGASTRODUODENOSCOPY (EGD) WITH PROPOFOL;  Surgeon: Lin Landsman, MD;  Location: [facility] ENDOSCOPY;  Service: Gastroenterology;  Laterality: N/A;    Prior to Admission medications   Medication Sig Start Date  End Date Taking? Authorizing Provider  furosemide (LASIX) 20 MG tablet Take 3 tablets (60 mg total) by mouth daily. 03/16/21 04/15/21  Lin Landsman, MD  nadolol (CORGARD) 20 MG tablet Take 1 tablet (20 mg total) by mouth daily. 03/16/21 04/15/21  Lin Landsman, MD  spironolactone (ALDACTONE) 50 MG tablet Take 3 tablets (150 mg total) by mouth daily. 03/16/21 04/15/21  Lin Landsman, MD  SV VITAMIN B-12 ER 1000 MCG TBCR Take 1 tablet by mouth daily. 05/03/21   [provider]  Vitamin D, Ergocalciferol, (DRISDOL) 1.25 MG (50000 UNIT) CAPS capsule Take 1 capsule (50,000 Units total) by mouth every 7 (seven) days. 03/17/21   Lin Landsman, MD    Current Facility-Administered Medications:    0.9 %  sodium chloride infusion, , Intravenous, Continuous, Meagan Spease, Tally Due, MD, Last Rate: 20 mL/hr at 08/24/21 1638, Restarted at 08/24/21 1708   [MAR Hold] cefTRIAXone (ROCEPHIN) 2 g in sodium chloride 0.9 % 100 mL IVPB, 2 g, Intravenous, Q24H, Robertta Halfhill, Tally Due, MD   [MAR Hold] Chlorhexidine Gluconate Cloth 2 % PADS 6 each, 6 each, Topical, Daily, Elwyn Reach, MD, 6 each at 08/24/21 1300   [MAR Hold] docusate sodium (COLACE) capsule 200 mg, 200 mg, Oral, BID PRN, Wyvonnia Dusky, MD   [facility] Hold] LORazepam (ATIVAN) injection 1-2 mg, 1-2 mg, Intravenous, Q1H PRN, Wyvonnia Dusky, MD   [facility] Hold] morphine 2 MG/ML injection 1 mg, 1 mg, Intravenous, Q4H PRN, Wyvonnia Dusky, MD   [COMPLETED] octreotide (SANDOSTATIN) 2 mcg/mL load via infusion 50 mcg, 50 mcg, Intravenous, Once, 50 mcg at 08/23/21 2105 **AND** octreotide (SANDOSTATIN) 500 mcg in sodium chloride 0.9 % 250 mL (2 mcg/mL) infusion, 50 mcg/hr, Intravenous, Continuous, Garba, Mohammad L, MD, Last Rate: 25 mL/hr at 08/24/21 1234, 50 mcg/hr at 08/24/21 1234   [MAR Hold] ondansetron (ZOFRAN) 8 mg in sodium chloride 0.9 % 50 mL IVPB, 8 mg, Intravenous, Q8H PRN, Jimmye [name], Jamiese M, MD   pantoprozole (PROTONIX) 80  mg /NS 100 mL infusion, 8 mg/hr, Intravenous, Continuous, Garba, Mohammad L, MD, Last Rate: 10 mL/hr at 08/24/21 1519, 8 mg/hr at 08/24/21 1519  Family History  Problem Relation Age of Onset   Lung cancer Mother    Cancer Father    Heart attack Brother      Social History   Tobacco Use   Smoking status: Never   Smokeless tobacco: Never  Vaping Use   Vaping Use: Never used  Substance Use Topics   Alcohol use: Not Currently    Alcohol/week: 72.0 standard drinks    Types: 72 Cans of beer per week   Drug use: Never    Allergies as of 08/23/2021   (No Known Allergies)    Review of Systems:    All systems reviewed and negative except where noted in HPI.   Physical Exam:  BP (!) 102/59   Pulse 84   Temp (!) 96.5 F (35.8 C) (Temporal)   Resp (!) 22   Ht 5\' 10"  (1.778 m)   Wt 104.3 kg   SpO2 100%   BMI 33.00 kg/m  No LMP for male patient.  General:   Alert,  Well-developed, well-nourished, pleasant and cooperative in NAD Head:  Normocephalic and atraumatic.  Eyes:  Sclera clear, no icterus.   Conjunctiva pink. Ears:  Normal auditory acuity. Nose:  No deformity, discharge, or lesions. Mouth:  No deformity or lesions,oropharynx pink & moist. Neck:  Supple; no masses or thyromegaly. Lungs:  Respirations even and unlabored.  Clear throughout to auscultation.   No wheezes, crackles, or rhonchi. No acute distress. Heart:  Regular rate and rhythm; no murmurs, clicks, rubs, or gallops. Abdomen:  Normal bowel sounds. Soft, non-tender and non-distended without masses, hepatosplenomegaly or hernias noted.  No guarding or rebound tenderness.   Rectal: Not performed Msk:  Symmetrical without gross deformities. Good, equal movement & strength bilaterally. Pulses:  Normal pulses noted. Extremities:  No clubbing or edema.  No cyanosis. Neurologic:  Alert and oriented x3;  grossly normal neurologically. Skin:  Intact without significant lesions or rashes. No jaundice. Psych:  Alert  and cooperative. Normal mood and affect.  Imaging Studies: Reviewed  Assessment and Plan:   Terrence Martin is a 55 y.o. male with history of decompensated alcoholic cirrhosis, esophageal variceal bleeding s/p ligation x3 in 02/2021, volume overload presented with hematemesis and melena resulting in acute blood loss anemia  Hematemesis and melena: with elevated BUN/creatinine ratio Suspect variceal bleed or peptic ulcer disease or severe portal hypertensive gastropathy Continue octreotide and pantoprazole drips Continue ceftriaxone daily for SBP prophylaxis His hemoglobin has responded appropriately to blood transfusion, monitor CBC closely to maintain platelets above 50 and hemoglobin above 7 Patient is currently n.p.o., recommend upper endoscopy today Hold nonselective beta-blocker in setting of acute GI bleed to prevent acute renal insufficiency Close follow-up with GI as outpatient  Decompensated cirrhosis of liver No evidence of SBP, currently euvolemic Continue ceftriaxone for SBP prophylaxis IV fluids to correct uremia Restart low-dose diuretics upon discharge Reiterated on abstinence from alcohol use    Terrence Darby, MD

## 2021-08-24 NOTE — Op Note (Signed)
[facility] Gastroenterology Patient Name: Terrence Martin Procedure Date: 08/24/2021 5:01 PM MRN: [id number] Account #: [id number] Date of Birth: [birth year] Admit Type: Outpatient Age: 55 Room: [facility] ENDO ROOM 3 Gender: Male Note Status: Finalized Instrument Name: Upper Endoscope [id number] Procedure:             Upper GI endoscopy Indications:           Acute post hemorrhagic anemia, Hematemesis, Melena,                         Cirrhosis with UGI bleeding rule out esophageal varices Providers:             Lin Landsman MD, MD Medicines:             General Anesthesia Complications:         No immediate complications. Estimated blood loss: None. Procedure:             Pre-Anesthesia Assessment:                        - Prior to the procedure, a History and Physical was                         performed, and patient medications and allergies were                         reviewed. The patient is competent. The risks and                         benefits of the procedure and the sedation options and                         risks were discussed with the patient. All questions                         were answered and informed consent was obtained.                         Patient identification and proposed procedure were                         verified by the physician, the nurse, the                         anesthesiologist, the anesthetist and the technician                         in the pre-procedure area in the procedure room in the                         endoscopy suite. Mental Status Examination: alert and                         oriented. Airway Examination: normal oropharyngeal                         airway and neck mobility. Respiratory Examination:  clear to auscultation. CV Examination: normal.                         Prophylactic Antibiotics: The patient does not require                         prophylactic antibiotics. Prior  Anticoagulants: The                         patient has taken no previous anticoagulant or                         antiplatelet agents. ASA Grade Assessment: III - A                         patient with severe systemic disease. After reviewing                         the risks and benefits, the patient was deemed in                         satisfactory condition to undergo the procedure. The                         anesthesia plan was to use general anesthesia.                         Immediately prior to administration of medications,                         the patient was re-assessed for adequacy to receive                         sedatives. The heart rate, respiratory rate, oxygen                         saturations, blood pressure, adequacy of pulmonary                         ventilation, and response to care were monitored                         throughout the procedure. The physical status of the                         patient was re-assessed after the procedure.                        After obtaining informed consent, the endoscope was                         passed under direct vision. Throughout the procedure,                         the patient's blood pressure, pulse, and oxygen                         saturations were monitored continuously. The Endoscope  was introduced through the mouth, and advanced to the                         second part of duodenum. The upper GI endoscopy was                         accomplished without difficulty. The patient tolerated                         the procedure well. Findings:      The duodenal bulb and second portion of the duodenum were normal.      Severe, diffuse portal hypertensive gastropathy was found in the entire       examined stomach.      Four columns of large (> 5 mm) varices with stigmata of recent bleeding       were found in the lower third of the esophagus, 35 cm from the incisors.       Red wale  signs were present. Scarring from prior treatment was visible.       Evidence of partial eradication was visible. The varices appeared larger       than they were at prior exam. Three bands were successfully placed with       incomplete eradication of varices. There was no bleeding during and at       the end of the procedure. Impression:            - Normal duodenal bulb and second portion of the                         duodenum.                        - Portal hypertensive gastropathy.                        - Large (> 5 mm) esophageal varices with stigmata of                         recent bleeding. Incompletely eradicated. Banded.                        - No specimens collected. Recommendation:        - Return patient to hospital ward for ongoing care.                        - Clear liquid diet today.                        - Continue present medications.                        - Repeat upper endoscopy in 2 months for retreatment                         and for endoscopic band ligation.                        - Return to my office as previously scheduled. Procedure Code(s):     --- Professional ---  43244, Esophagogastroduodenoscopy, flexible,                         transoral; with band ligation of esophageal/gastric                         varices Diagnosis Code(s):     --- Professional ---                        K74.60, Unspecified cirrhosis of liver                        I85.11, Secondary esophageal varices with bleeding                        K76.6, Portal hypertension                        K31.89, Other diseases of stomach and duodenum                        D62, Acute posthemorrhagic anemia                        K92.1, Melena (includes Hematochezia)                        K92.0, Hematemesis CPT copyright 2019 American Medical Association. All rights reserved. The codes documented in this report are preliminary and upon coder review may  be revised to  meet current compliance requirements. Dr. Ulyess Mort Lin Landsman MD, MD 08/24/2021 5:24:47 PM This report has been signed electronically. Number of Addenda: 0 Note Initiated On: 08/24/2021 5:01 PM Estimated Blood Loss:  Estimated blood loss: none.      [facility]

## 2021-08-24 NOTE — ED Notes (Signed)
Phlebotomy @ the bedside. 

## 2021-08-24 NOTE — ED Notes (Signed)
MD notified of the patients Hgb and soft BPs.

## 2021-08-25 ENCOUNTER — Encounter: Payer: Self-pay | Admitting: Gastroenterology

## 2021-08-25 ENCOUNTER — Inpatient Hospital Stay: Payer: BC Managed Care – PPO

## 2021-08-25 ENCOUNTER — Telehealth: Payer: Self-pay

## 2021-08-25 DIAGNOSIS — K7290 Hepatic failure, unspecified without coma: Secondary | ICD-10-CM | POA: Diagnosis not present

## 2021-08-25 DIAGNOSIS — K7030 Alcoholic cirrhosis of liver without ascites: Secondary | ICD-10-CM | POA: Diagnosis not present

## 2021-08-25 DIAGNOSIS — E875 Hyperkalemia: Secondary | ICD-10-CM

## 2021-08-25 DIAGNOSIS — D696 Thrombocytopenia, unspecified: Secondary | ICD-10-CM

## 2021-08-25 DIAGNOSIS — K7200 Acute and subacute hepatic failure without coma: Secondary | ICD-10-CM

## 2021-08-25 DIAGNOSIS — I8511 Secondary esophageal varices with bleeding: Secondary | ICD-10-CM | POA: Diagnosis not present

## 2021-08-25 DIAGNOSIS — K7682 Hepatic encephalopathy: Secondary | ICD-10-CM

## 2021-08-25 LAB — BPAM RBC
Blood Product Expiration Date: 202210292359
Blood Product Expiration Date: 202210302359
Blood Product Expiration Date: 202210302359
ISSUE DATE / TIME: 202209262121
ISSUE DATE / TIME: 202209270107
ISSUE DATE / TIME: 202209270309
Unit Type and Rh: 5100
Unit Type and Rh: 5100
Unit Type and Rh: 5100

## 2021-08-25 LAB — TYPE AND SCREEN
ABO/RH(D): O POS
Antibody Screen: NEGATIVE
Unit division: 0
Unit division: 0
Unit division: 0

## 2021-08-25 LAB — COMPREHENSIVE METABOLIC PANEL
ALT: 31 U/L (ref 0–44)
AST: 68 U/L — ABNORMAL HIGH (ref 15–41)
Albumin: 2.2 g/dL — ABNORMAL LOW (ref 3.5–5.0)
Alkaline Phosphatase: 54 U/L (ref 38–126)
Anion gap: 7 (ref 5–15)
BUN: 38 mg/dL — ABNORMAL HIGH (ref 6–20)
CO2: 21 mmol/L — ABNORMAL LOW (ref 22–32)
Calcium: 7.8 mg/dL — ABNORMAL LOW (ref 8.9–10.3)
Chloride: 113 mmol/L — ABNORMAL HIGH (ref 98–111)
Creatinine, Ser: 1.09 mg/dL (ref 0.61–1.24)
GFR, Estimated: >60 mL/min (ref >60)
Glucose, Bld: 127 mg/dL — ABNORMAL HIGH (ref 70–99)
Potassium: 4.5 mmol/L (ref 3.5–5.1)
Sodium: 141 mmol/L (ref 135–145)
Total Bilirubin: 1.8 mg/dL — ABNORMAL HIGH (ref 0.3–1.2)
Total Protein: 5.9 g/dL — ABNORMAL LOW (ref 6.5–8.1)

## 2021-08-25 LAB — AMMONIA
Ammonia: 116 umol/L — ABNORMAL HIGH (ref 9–35)
Ammonia: 52 umol/L — ABNORMAL HIGH (ref 9–35)

## 2021-08-25 LAB — BLOOD GAS, ARTERIAL
Acid-base deficit: 2.7 mmol/L — ABNORMAL HIGH (ref 0.0–2.0)
Bicarbonate: 20.1 mmol/L (ref 20.0–28.0)
FIO2: 0.21
O2 Saturation: 98 %
Patient temperature: 37
pCO2 arterial: 27 mmHg — ABNORMAL LOW (ref 32.0–48.0)
pH, Arterial: 7.48 — ABNORMAL HIGH (ref 7.350–7.450)
pO2, Arterial: 96 mmHg (ref 83.0–108.0)

## 2021-08-25 LAB — HEMOGLOBIN AND HEMATOCRIT, BLOOD
HCT: 27.9 % — ABNORMAL LOW (ref 39.0–52.0)
HCT: 28.5 % — ABNORMAL LOW (ref 39.0–52.0)
Hemoglobin: 9.4 g/dL — ABNORMAL LOW (ref 13.0–17.0)
Hemoglobin: 9.5 g/dL — ABNORMAL LOW (ref 13.0–17.0)

## 2021-08-25 LAB — TSH: TSH: 0.687 u[IU]/mL (ref 0.350–4.500)

## 2021-08-25 LAB — LACTIC ACID, PLASMA
Lactic Acid, Venous: 2.8 mmol/L (ref 0.5–1.9)
Lactic Acid, Venous: 3.2 mmol/L (ref 0.5–1.9)

## 2021-08-25 LAB — TROPONIN I (HIGH SENSITIVITY)
Troponin I (High Sensitivity): 23 ng/L — ABNORMAL HIGH (ref <18)
Troponin I (High Sensitivity): 26 ng/L — ABNORMAL HIGH (ref <18)

## 2021-08-25 LAB — MAGNESIUM: Magnesium: 1.7 mg/dL (ref 1.7–2.4)

## 2021-08-25 MED ORDER — FOLIC ACID 5 MG/ML IJ SOLN
1.0000 mg | Freq: Every day | INTRAMUSCULAR | Status: DC
  Administered 2021-08-25 – 2021-08-31 (×6): 1 mg via INTRAVENOUS
  Filled 2021-08-25 (×8): qty 0.2

## 2021-08-25 MED ORDER — LORAZEPAM 2 MG/ML IJ SOLN: 1.0000 mg | INTRAMUSCULAR | Status: DC | PRN

## 2021-08-25 MED ORDER — RIFAXIMIN 550 MG PO TABS: 550.0000 mg | ORAL_TABLET | Freq: Two times a day (BID) | ORAL | Status: DC

## 2021-08-25 MED ORDER — LORAZEPAM 1 MG PO TABS: 1.0000 mg | ORAL_TABLET | ORAL | Status: DC | PRN

## 2021-08-25 MED ORDER — LACTULOSE 10 GM/15ML PO SOLN
30.0000 g | Freq: Three times a day (TID) | ORAL | Status: DC
  Filled 2021-08-25: qty 60

## 2021-08-25 MED ORDER — LACTULOSE ENEMA
300.0000 mL | ORAL | Status: DC | PRN
  Administered 2021-08-25 (×2): 300 mL via RECTAL
  Filled 2021-08-25 (×4): qty 300

## 2021-08-25 MED ORDER — LACTULOSE ENEMA
300.0000 mL | Freq: Once | ORAL | Status: AC
  Administered 2021-08-25: 300 mL via RECTAL
  Filled 2021-08-25: qty 300

## 2021-08-25 MED ORDER — SODIUM CHLORIDE 0.9 % IV SOLN: INTRAVENOUS | Status: DC

## 2021-08-25 MED ORDER — THIAMINE HCL 100 MG/ML IJ SOLN
500.0000 mg | INTRAVENOUS | Status: AC
  Administered 2021-08-25 – 2021-08-27 (×8): 500 mg via INTRAVENOUS
  Filled 2021-08-25 (×8): qty 5

## 2021-08-25 NOTE — Telephone Encounter (Signed)
Patient need repeat EGD in 2 months. Will put patient in a recall list since he is still in the hospital

## 2021-08-25 NOTE — Progress Notes (Signed)
Patient ID: Anmol Fleck, male   DOB: [birth year], 55 y.o.   MRN: [id number] [facility] PROGRESS NOTE  Dameion Briles [id number]:[id number] DOB: June 13, 1966 DOA: 08/23/2021 PCP: Pcp, No  HPI/Subjective: Patient minimally responsive to sternal rub.  Admitted with esophageal variceal bleed.  Had EGD with banding procedure yesterday.  Ammonia level found to be over 100.  Objective: Vitals:   08/25/21 0900 08/25/21 1000  BP: 136/74 126/75  Pulse: 88 100  Resp:    Temp:    SpO2: 100% 99%    Intake/Output Summary (Last 24 hours) at 08/25/2021 1257 Last data filed at 08/25/2021 1100 Gross per 24 hour  Intake 1431.7 ml  Output 1800 ml  Net -368.3 ml   Filed Weights   08/23/21 1954 08/24/21 1635  Weight: 104.3 kg 104.3 kg    ROS: Review of Systems  Unable to perform ROS: Acuity of condition  Exam: Physical Exam HENT:     Head: Normocephalic.     Mouth/Throat:     Pharynx: No oropharyngeal exudate.  Eyes:     General: Lids are normal.     Comments: Icteric conjunctiva  Cardiovascular:     Rate and Rhythm: Normal rate and regular rhythm.     Heart sounds: Normal heart sounds, S1 normal and S2 normal.  Pulmonary:     Breath sounds: Examination of the right-lower field reveals decreased breath sounds. Examination of the left-lower field reveals decreased breath sounds. Decreased breath sounds present. No wheezing, rhonchi or rales.  Abdominal:     Palpations: Abdomen is soft.     Tenderness: There is no abdominal tenderness.  Musculoskeletal:     Right lower leg: No swelling.     Left lower leg: No swelling.  Skin:    General: Skin is warm.     Findings: No rash.  Neurological:     Mental Status: He is unresponsive.      Scheduled Meds:  Chlorhexidine Gluconate Cloth  6 each Topical Daily   lactulose  30 g Oral TID   Continuous Infusions:  sodium chloride 50 mL/hr at 08/25/21 1115   cefTRIAXone (ROCEPHIN)  IV 2 g (08/24/21 2006)   octreotide  (SANDOSTATIN)    IV infusion  50 mcg/hr (08/25/21 0606)   ondansetron (ZOFRAN) IV     pantoprazole 8 mg/hr (08/25/21 0640)    Assessment/Plan:  Acute hepatic encephalopathy.  The patient does not alert enough to take oral lactulose.  We will give rectal lactulose every 4 hours until more awake.  Ammonia level 116 Esophageal variceal bleed and portal hypertensive gastropathy.  Acute blood loss anemia.  Responded to blood transfusion.  EGD with banding done yesterday.  Continue empiric Rocephin.  On Protonix and octreotide drips.  Last hemoglobin 9.5. Alcoholic liver cirrhosis.  Thrombocytopenia.  Alcohol withdrawal protocol.  I will give high-dose thiamine. Lactic acidosis.  We will give IV fluids.  Empirically on Rocephin. Hypomagnesemia we will give IV magnesium Hyperkalemia improved.        Code Status:     Code Status Orders  (From admission, onward)           Start     Ordered   08/24/21 0000  Full code  Continuous        08/23/21 2359           Code Status History     Date Active Date Inactive Code Status Order ID Comments User Context   02/17/2021 0256 02/19/2021 2140 Full Code [id number]  Toy Baker, MD  Inpatient      Family Communication: Spoke with girlfriend on the phone Disposition Plan: Status is: Inpatient  Dispo: The patient is from: Home              Anticipated d/c is to: Home              Patient currently unresponsive.  Will need more time here in the hospital   Difficult to place patient.  No.  Consultants: Gastroenterology  Procedures: Endoscopy with banding on 08/24/2021  Antibiotics: Rocephin  Time spent: 28 minutes  [place]

## 2021-08-25 NOTE — TOC Initial Note (Signed)
Transition of Care [facility]) - Initial/Assessment Note    Patient Details  Name: Terrence Martin MRN: [id number] Date of Birth: [birth year]  [facility] CM/SW Contact:    Kerin Salen, RN Phone Number: 08/25/2021, 1:41 PM  Clinical Narrative: Unable to do TOC assessment, patient with mittens. Called and spoke with friend Arleta Creek who says they lived together patient was independent with ADL's, cooking, shopping and did not require any assistance. Uses [facility]. However she did not know his PCP name or practice. Ms. Veva Holes did not feel that patient would not need anything when discharge at this time. TOC to continue to track for discharge needs.                      Patient Goals and CMS Choice        Expected Discharge Plan and Services                                                Prior Living Arrangements/Services                       Activities of Daily Living Home Assistive Devices/Equipment: None ADL Screening (condition at time of admission) Patient's cognitive ability adequate to safely complete daily activities?: Yes Is the patient deaf or have difficulty hearing?: No Does the patient have difficulty seeing, even when wearing glasses/contacts?: No Does the patient have difficulty concentrating, remembering, or making decisions?: No Patient able to express need for assistance with ADLs?: Yes Does the patient have difficulty dressing or bathing?: No Independently performs ADLs?: Yes (appropriate for developmental age) Does the patient have difficulty walking or climbing stairs?: No Weakness of Legs: None Weakness of Arms/Hands: None  Permission Sought/Granted                  Emotional Assessment              Admission diagnosis:  Secondary esophageal varices with bleeding ([place]) [I85.11] Patient Active Problem List   Diagnosis Date Noted   Acute hepatic encephalopathy    Hyperkalemia 08/23/2021    Hypomagnesemia 08/23/2021   Thrombocytopenia ([place]) 08/23/2021   Esophageal varices without bleeding ([place])    Screening for colon cancer    Alcoholic cirrhosis of liver without ascites ([place])    Alcohol abuse 02/17/2021   Hyperglycemia 02/17/2021   Hypovolemic shock ([place]) 02/17/2021   Acute blood loss anemia 02/17/2021   Secondary esophageal varices with bleeding ([place])    Pre-op evaluation    Elevated troponin    Upper GI bleed 02/16/2021   Anemia 02/16/2021   IDA (iron deficiency anemia) 08/24/2020   Alcohol use 08/24/2020   Transaminitis 08/24/2020   GIB (gastrointestinal bleeding) 06/08/2020   PCP:  Merryl Hacker No Pharmacy:   [facility] [address], [place] - [place] [address] Phone: [phone] Fax: [phone]     Social Determinants of Health (SDOH) Interventions    Readmission Risk Interventions No flowsheet data found.

## 2021-08-25 NOTE — Progress Notes (Signed)
Terrence Darby, MD [address]  [place]  [place], [place] [address]  Main: [phone]  Fax: [phone] Pager: [phone]   Subjective: Patient had acute change in his mental status around 2 AM.  He was less responsive.  He underwent CT head which was unremarkable.  He was hemodynamically stable.  He was started on lactulose enema as he was not able to tolerate p.o. because of altered mental status.  Patient was quite agitated in the ICU, received Ativan.  His hemoglobin has been stable within last 24 hours.  Patient also underwent EGD yesterday evening, found to have large esophageal varices and underwent ligation.   Objective: Vital signs in last 24 hours: Vitals:   08/25/21 0900 08/25/21 1000 08/25/21 1200 08/25/21 1300  BP: 136/74 126/75 94/61 (!) 83/52  Pulse: 88 100 91 93  Resp:      Temp:      TempSrc:      SpO2: 100% 99% 100% 100%  Weight:      Height:       Weight change: 0 kg  Intake/Output Summary (Last 24 hours) at 08/25/2021 1419 Last data filed at 08/25/2021 1100 Gross per 24 hour  Intake 854.17 ml  Output 1800 ml  Net -945.83 ml     Exam: Heart:: Regular rate and rhythm or S1S2 present Lungs: normal and clear to auscultation Abdomen: soft, nontender, normal bowel sounds  Lab Results: CBC Latest Ref Rng & Units 08/25/2021 08/25/2021 08/24/2021  WBC 4.0 - 10.5 K/uL - - 8.0  Hemoglobin 13.0 - 17.0 g/dL 9.5(L) 9.4(L) 9.0(L)  Hematocrit 39.0 - 52.0 % 28.5(L) 27.9(L) 27.0(L)  Platelets 150 - 400 K/uL - - 81(L)   CMP Latest Ref Rng & Units 08/25/2021 08/24/2021 08/24/2021  Glucose 70 - 99 mg/dL 127(H) - 147(H)  BUN 6 - 20 mg/dL 38(H) - 42(H)  Creatinine 0.61 - 1.24 mg/dL 1.09 - 1.22  Sodium 135 - 145 mmol/L 141 - 138  Potassium 3.5 - 5.1 mmol/L 4.5 4.6 5.2(H)  Chloride 98 - 111 mmol/L 113(H) - 109  CO2 22 - 32 mmol/L 21(L) - 22  Calcium 8.9 - 10.3 mg/dL 7.8(L) - 7.5(L)  Total Protein 6.5 - 8.1 g/dL 5.9(L) - 5.5(L)  Total Bilirubin 0.3 - 1.2  mg/dL 1.8(H) - 1.9(H)  Alkaline Phos 38 - 126 U/L 54 - 53  AST 15 - 41 U/L 68(H) - 59(H)  ALT 0 - 44 U/L 31 - 30    Micro Results: Recent Results (from the past 240 hour(s))  Resp Panel by RT-PCR (Flu A&B, Covid) Nasopharyngeal Swab     Status: None   Collection Time: 08/23/21  8:12 PM   Specimen: Nasopharyngeal Swab; Nasopharyngeal(NP) swabs in vial transport medium  Result Value Ref Range Status   SARS Coronavirus 2 by RT PCR NEGATIVE NEGATIVE Final    Comment: (NOTE) SARS-CoV-2 target nucleic acids are NOT DETECTED.  The SARS-CoV-2 RNA is generally detectable in upper respiratory specimens during the acute phase of infection. The lowest concentration of SARS-CoV-2 viral copies this assay can detect is 138 copies/mL. A negative result does not preclude SARS-Cov-2 infection and should not be used as the sole basis for treatment or other patient management decisions. A negative result may occur with  improper specimen collection/handling, submission of specimen other than nasopharyngeal swab, presence of viral mutation(s) within the areas targeted by this assay, and inadequate number of viral copies(<138 copies/mL). A negative result must be combined with clinical observations, patient  history, and epidemiological information. The expected result is Negative.  Fact Sheet for Patients:  EntrepreneurPulse.com.au  Fact Sheet for Healthcare Providers:  IncredibleEmployment.be  This test is no t yet approved or cleared by the [place] FDA and  has been authorized for detection and/or diagnosis of SARS-CoV-2 by FDA under an Emergency Use Authorization (EUA). This EUA will remain  in effect (meaning this test can be used) for the duration of the COVID-19 declaration under Section 564(b)(1) of the Act, 21 U.S.C.section 360bbb-3(b)(1), unless the authorization is terminated  or revoked sooner.       Influenza A by PCR NEGATIVE NEGATIVE Final    Influenza B by PCR NEGATIVE NEGATIVE Final    Comment: (NOTE) The Xpert Xpress SARS-CoV-2/FLU/RSV plus assay is intended as an aid in the diagnosis of influenza from Nasopharyngeal swab specimens and should not be used as a sole basis for treatment. Nasal washings and aspirates are unacceptable for Xpert Xpress SARS-CoV-2/FLU/RSV testing.  Fact Sheet for Patients: EntrepreneurPulse.com.au  Fact Sheet for Healthcare Providers: IncredibleEmployment.be  This test is not yet approved or cleared by the [place] FDA and has been authorized for detection and/or diagnosis of SARS-CoV-2 by FDA under an Emergency Use Authorization (EUA). This EUA will remain in effect (meaning this test can be used) for the duration of the COVID-19 declaration under Section 564(b)(1) of the Act, 21 U.S.C. section 360bbb-3(b)(1), unless the authorization is terminated or revoked.  Performed at [facility], [address] [place], [place] [address]   MRSA Next Gen by PCR, Nasal     Status: None   Collection Time: 08/24/21 12:56 PM   Specimen: Nasal Mucosa; Nasal Swab  Result Value Ref Range Status   MRSA by PCR Next Gen NOT DETECTED NOT DETECTED Final    Comment: (NOTE) The GeneXpert MRSA Assay (FDA approved for NASAL specimens only), is one component of a comprehensive MRSA colonization surveillance program. It is not intended to diagnose MRSA infection nor to guide or monitor treatment for MRSA infections. Test performance is not FDA approved in patients less than 39 years old. Performed at [facility], [address] [place], [place] [address]    Studies/Results: CT HEAD WO CONTRAST (5MM)  Result Date: 08/25/2021 CLINICAL DATA:  Encephalopathy EXAM: CT HEAD WITHOUT CONTRAST TECHNIQUE: Contiguous axial images were obtained from the base of the skull through the vertex without intravenous contrast. COMPARISON:  11/09/2016 FINDINGS:  Brain: There is no mass, hemorrhage or extra-axial collection. The size and configuration of the ventricles and extra-axial CSF spaces are normal. The brain parenchyma is normal, without acute or chronic infarction. Vascular: No abnormal hyperdensity of the major intracranial arteries or dural venous sinuses. No intracranial atherosclerosis. Skull: The visualized skull base, calvarium and extracranial soft tissues are normal. Sinuses/Orbits: No fluid levels or advanced mucosal thickening of the visualized paranasal sinuses. No mastoid or middle ear effusion. The orbits are normal. IMPRESSION: Normal head CT. Electronically Signed   By: Ulyses Jarred M.D.   On: 08/25/2021 00:51   Medications: I have reviewed the patient's current medications. Prior to Admission:  Medications Prior to Admission  Medication Sig Dispense Refill Last Dose   furosemide (LASIX) 20 MG tablet Take 3 tablets (60 mg total) by mouth daily. 90 tablet 2 60+ days   nadolol (CORGARD) 20 MG tablet Take 1 tablet (20 mg total) by mouth daily. 30 tablet 2 60+ days   spironolactone (ALDACTONE) 50 MG tablet Take 3 tablets (150 mg total) by  mouth daily. 90 tablet 2 60+ days   SV VITAMIN B-12 ER 1000 MCG TBCR Take 1 tablet by mouth daily.   60+ days   Vitamin D, Ergocalciferol, (DRISDOL) 1.25 MG (50000 UNIT) CAPS capsule Take 1 capsule (50,000 Units total) by mouth every 7 (seven) days. 12 capsule 0 60+ days   Scheduled:  Chlorhexidine Gluconate Cloth  6 each Topical Daily   folic acid  1 mg Intravenous Daily   lactulose  30 g Oral TID   Continuous:  sodium chloride 50 mL/hr at 08/25/21 1115   cefTRIAXone (ROCEPHIN)  IV 2 g (08/24/21 2006)   octreotide  (SANDOSTATIN)    IV infusion 50 mcg/hr (08/25/21 0606)   ondansetron (ZOFRAN) IV     pantoprazole 8 mg/hr (08/25/21 0640)   thiamine injection     AST:MHDQQIWL sodium, lactulose, LORazepam, LORazepam **OR** LORazepam, morphine injection, naLOXone (NARCAN)  injection, ondansetron  (ZOFRAN) IV Anti-infectives (From admission, onward)    Start     Dose/Rate Route Frequency Ordered Stop   08/25/21 1000  rifaximin (XIFAXAN) tablet 550 mg  Status:  Discontinued        550 mg Oral 2 times daily 08/25/21 0754 08/25/21 0919   08/24/21 2000  cefTRIAXone (ROCEPHIN) 2 g in sodium chloride 0.9 % 100 mL IVPB        2 g 200 mL/hr over 30 Minutes Intravenous Every 24 hours 08/24/21 0843     08/24/21 1800  cefTRIAXone (ROCEPHIN) 1 g in sodium chloride 0.9 % 100 mL IVPB  Status:  Discontinued        1 g 200 mL/hr over 30 Minutes Intravenous Every 24 hours 08/24/21 0830 08/24/21 0843   08/23/21 2015  cefTRIAXone (ROCEPHIN) 2 g in sodium chloride 0.9 % 100 mL IVPB        2 g 200 mL/hr over 30 Minutes Intravenous  Once 08/23/21 2009 08/23/21 2053      Scheduled Meds:  Chlorhexidine Gluconate Cloth  6 each Topical Daily   folic acid  1 mg Intravenous Daily   lactulose  30 g Oral TID   Continuous Infusions:  sodium chloride 50 mL/hr at 08/25/21 1115   cefTRIAXone (ROCEPHIN)  IV 2 g (08/24/21 2006)   octreotide  (SANDOSTATIN)    IV infusion 50 mcg/hr (08/25/21 0606)   ondansetron (ZOFRAN) IV     pantoprazole 8 mg/hr (08/25/21 0640)   thiamine injection     PRN Meds:.docusate sodium, lactulose, LORazepam, LORazepam **OR** LORazepam, morphine injection, naLOXone (NARCAN)  injection, ondansetron (ZOFRAN) IV   Assessment: Principal Problem:   Secondary esophageal varices with bleeding ([place]) Active Problems:   Alcohol abuse   Acute blood loss anemia   Alcoholic cirrhosis of liver without ascites ([facility])   Hyperkalemia   Hypomagnesemia   Thrombocytopenia ([facility])   Acute hepatic encephalopathy  Diangelo Radel is a 55 y.o. male with history of decompensated alcoholic cirrhosis, esophageal variceal bleeding s/p ligation x3 in 02/2021, volume overload presented with hematemesis and melena resulting in acute blood loss anemia  Plan: Hematemesis and melena: with elevated BUN/creatinine  ratio S/p EGD on 9/27, found to have large esophageal varices with red wale signs, s/p ligation x3, also has severe portal hypertensive gastropathy No further episodes of melena or hematemesis since admission Hemoglobin is currently stable, monitor CBC daily, maintain hemoglobin greater than 7, platelets above 50 Continue octreotide and pantoprazole drips for 72 hours Continue ceftriaxone daily for SBP prophylaxis Patient is currently n.p.o., due to hepatic encephalopathy Hold nonselective beta-blocker  in setting of acute GI bleed to prevent acute renal insufficiency  Hepatic encephalopathy Currently on lactulose enemas When able to, start p.o. lactulose 20 g twice daily and rifaximin 550 mg twice daily and continue the same upon discharge CT head unremarkable  Decompensated cirrhosis of liver Abdomen is mildly distended, if worsening, watch for ascites and volume overload Continue ceftriaxone for SBP prophylaxis No evidence of acute renal failure Restart low-dose diuretics upon discharge    LOS: 2 days   Terrence Martin 08/25/2021, 2:19 PM

## 2021-08-25 NOTE — Progress Notes (Addendum)
The patient had dark stools earlier.  He is on IV Protonix drip.  He has worsening altered mental status and was less responsive.  I seen the patient and examined him while he was in CT.  He was more alert when but would easily fall asleep.  His neurological exam was grossly nonfocal.  Lungs were clear to auscultation bilaterally and cardiovascular revealed regular rate and rhythm with normal S1-S2 and no murmurs gallops or rubs.  A stat head CT scan that was ordered came back normal.  TSH was normal.  His troponin I came back 26 and lactic acid was 2.8.  CMP was remarkable for BUN of 38 and albumin of 2.2 with topotecan 5.9.  His blood glucose was 127. EKG showed sinus tachycardia with rate 107 with prolonged QT interval with QTC 549 MS.  There was no acute changes.  Stat ABG was unremarkable revealing a pH of 7.48 with PCO2 27, PO2 96 and HCO3 of 20.1 with O2 sat of 98%.  Ammonia level is currently pending.  He is hemodynamically stable.  We will continue to closely monitor him.  Neurochecks will be done every 4 hours for 24 hours.

## 2021-08-26 DIAGNOSIS — K7200 Acute and subacute hepatic failure without coma: Secondary | ICD-10-CM

## 2021-08-26 DIAGNOSIS — K7030 Alcoholic cirrhosis of liver without ascites: Secondary | ICD-10-CM | POA: Diagnosis not present

## 2021-08-26 LAB — CBC
HCT: 25.5 % — ABNORMAL LOW (ref 39.0–52.0)
Hemoglobin: 8.4 g/dL — ABNORMAL LOW (ref 13.0–17.0)
MCH: 29.1 pg (ref 26.0–34.0)
MCHC: 32.9 g/dL (ref 30.0–36.0)
MCV: 88.2 fL (ref 80.0–100.0)
Platelets: 89 10*3/uL — ABNORMAL LOW (ref 150–400)
RBC: 2.89 MIL/uL — ABNORMAL LOW (ref 4.22–5.81)
RDW: 18.6 % — ABNORMAL HIGH (ref 11.5–15.5)
WBC: 8.8 10*3/uL (ref 4.0–10.5)
nRBC: 0.5 % — ABNORMAL HIGH (ref 0.0–0.2)

## 2021-08-26 LAB — IRON AND TIBC
Iron: 25 ug/dL — ABNORMAL LOW (ref 45–182)
Saturation Ratios: 8 % — ABNORMAL LOW (ref 17.9–39.5)
TIBC: 309 ug/dL (ref 250–450)
UIBC: 284 ug/dL

## 2021-08-26 LAB — VITAMIN B12: Vitamin B-12: 1178 pg/mL — ABNORMAL HIGH (ref 180–914)

## 2021-08-26 LAB — FOLATE: Folate: 9.7 ng/mL

## 2021-08-26 LAB — FERRITIN: Ferritin: 37 ng/mL (ref 24–336)

## 2021-08-26 MED ORDER — LACTULOSE 10 GM/15ML PO SOLN
30.0000 g | Freq: Three times a day (TID) | ORAL | Status: DC
  Administered 2021-08-26 (×2): 30 g via ORAL
  Filled 2021-08-26 (×2): qty 60

## 2021-08-26 MED ORDER — RIFAXIMIN 200 MG PO TABS
200.0000 mg | ORAL_TABLET | Freq: Two times a day (BID) | ORAL | Status: DC
  Administered 2021-08-26: 200 mg via ORAL
  Filled 2021-08-26 (×2): qty 1

## 2021-08-26 MED ORDER — LACTULOSE ENEMA
300.0000 mL | Freq: Four times a day (QID) | ORAL | Status: DC | PRN
  Filled 2021-08-26: qty 300

## 2021-08-26 MED ORDER — LABETALOL HCL 5 MG/ML IV SOLN: 10.0000 mg | INTRAVENOUS | Status: DC | PRN

## 2021-08-26 MED ORDER — DEXTROSE-NACL 5-0.9 % IV SOLN: INTRAVENOUS | Status: DC

## 2021-08-26 MED ORDER — LACTULOSE ENEMA
300.0000 mL | Freq: Four times a day (QID) | ORAL | Status: DC
  Administered 2021-08-26: 300 mL via RECTAL
  Filled 2021-08-26 (×4): qty 300

## 2021-08-26 NOTE — Progress Notes (Signed)
Patient ID: Terrence Martin, male   DOB: 06-12-1966, 55 y.o.   MRN: [id number] [facility] PROGRESS NOTE  Terrence Martin [id number]:[id number] DOB: 1965/12/27 DOA: 08/23/2021 PCP: Pcp, No  HPI/Subjective: Patient moving his arms and legs in bed but not making any purposeful movements.  Still lethargic.  Admitted with esophageal variceal bleed.  Also has acute hepatic encephalopathy. Objective: Vitals:   08/26/21 1200 08/26/21 1300  BP: (!) 154/91 (!) 166/104  Pulse: 97 (!) 109  Resp:    Temp: 99 F (37.2 C)   SpO2: 100% 100%    Intake/Output Summary (Last 24 hours) at 08/26/2021 1338 Last data filed at 08/26/2021 1100 Gross per 24 hour  Intake 2004.32 ml  Output --  Net 2004.32 ml   Filed Weights   08/23/21 1954 08/24/21 1635  Weight: 104.3 kg 104.3 kg    ROS: Review of Systems  Unable to perform ROS: Acuity of condition  Exam: Physical Exam HENT:     Head: Normocephalic.     Mouth/Throat:     Pharynx: No oropharyngeal exudate.  Eyes:     General: Lids are normal.     Comments: Conjunctiva icteric  Cardiovascular:     Rate and Rhythm: Regular rhythm. Tachycardia present.     Heart sounds: Normal heart sounds, S1 normal and S2 normal.  Pulmonary:     Breath sounds: Examination of the right-lower field reveals decreased breath sounds. Examination of the left-lower field reveals decreased breath sounds. Decreased breath sounds present. No wheezing, rhonchi or rales.  Abdominal:     Palpations: Abdomen is soft.     Tenderness: There is no abdominal tenderness.  Musculoskeletal:     Right lower leg: No swelling.     Left lower leg: No swelling.  Skin:    General: Skin is warm.     Findings: No rash.  Neurological:     Mental Status: He is lethargic.     Comments: Moving his arms and legs on his own but not purposeful.      Scheduled Meds:  Chlorhexidine Gluconate Cloth  6 each Topical Daily   folic acid  1 mg Intravenous Daily   lactulose  300 mL Rectal Q6H    Continuous Infusions:  cefTRIAXone (ROCEPHIN)  IV 2 g (08/25/21 2055)   dextrose 5 % and 0.9% NaCl 50 mL/hr at 08/26/21 0828   octreotide  (SANDOSTATIN)    IV infusion 50 mcg/hr (08/26/21 1100)   ondansetron (ZOFRAN) IV     pantoprazole 8 mg/hr (08/26/21 1221)   thiamine injection 500 mg (08/26/21 0829)    Assessment/Plan:  Acute hepatic encephalopathy.  Ammonia level was 116 and came down to 52.  Continue lactulose enemas.  If no improvement in mental status tomorrow may need a Dobbhoff tube and oral lactulose and Xifaxan. Esophageal variceal bleed and portal hypertensive gastropathy.  Acute blood loss anemia.  Received blood transfusion here in the hospital.  Hemoglobin drifted down to 8.4 today.  Continue to monitor.  Patient had esophageal banding.  Continue empiric Rocephin.  Patient on Protonix and octreotide. Alcohol liver cirrhosis with thrombocytopenia.  Continue alcohol withdrawal protocol.  High-dose thiamine with altered mental status. Lactic acidosis.  Continue IV fluids.  Empiric Rocephin. Hypomagnesemia Hyperkalemia    Code Status:     Code Status Orders  (From admission, onward)           Start     Ordered   08/24/21 0000  Full code  Continuous  08/23/21 2359           Code Status History     Date Active Date Inactive Code Status Order ID Comments User Context   02/17/2021 0256 02/19/2021 2140 Full Code [id number]  Toy Baker, MD Inpatient      Family Communication: Spoke with patient's girlfriend on the phone Disposition Plan: Status is: Inpatient  Dispo: The patient is from: Home              Anticipated d/c is to: To be determined              Patient currently still with very impaired mental status.   Difficult to place patient.  No.  Consultants: Gastroenterology  Time spent: 28 minutes.  [place]  [facility] [employer]

## 2021-08-26 NOTE — Progress Notes (Signed)
Cephas Darby, MD [address]  [place]  [place], [place] [address]  Main: [phone]  Fax: [phone] Pager: [phone]   Subjective: Patient continues to remain altered, lethargic.  Slightly better today as he is able to open his eyes and verbalize.  He still not able to take p.o. because of his altered mental status.  He is receiving lactulose enemas.  No further episodes of melena or rectal bleeding.  Hemoglobin is stable.  Has been afebrile   Objective: Vital signs in last 24 hours: Vitals:   08/26/21 0600 08/26/21 0700 08/26/21 0800 08/26/21 0900  BP: (!) 153/93 (!) 115/91 (!) 142/87 139/86  Pulse: (!) 101 80 85 97  Resp:      Temp:   99.1 F (37.3 C)   TempSrc:   Axillary   SpO2: 100% 100% 100% 100%  Weight:      Height:       Weight change:   Intake/Output Summary (Last 24 hours) at 08/26/2021 0956 Last data filed at 08/26/2021 0900 Gross per 24 hour  Intake 1935.92 ml  Output --  Net 1935.92 ml     Exam: Heart:: Regular rate and rhythm or S1S2 present Lungs: normal and clear to auscultation Abdomen: soft, nontender, normal bowel sounds Does not have any pedal edema Lab Results: CBC Latest Ref Rng & Units 08/26/2021 08/25/2021 08/25/2021  WBC 4.0 - 10.5 K/uL 8.8 - -  Hemoglobin 13.0 - 17.0 g/dL 8.4(L) 9.5(L) 9.4(L)  Hematocrit 39.0 - 52.0 % 25.5(L) 28.5(L) 27.9(L)  Platelets 150 - 400 K/uL 89(L) - -   CMP Latest Ref Rng & Units 08/25/2021 08/24/2021 08/24/2021  Glucose 70 - 99 mg/dL 127(H) - 147(H)  BUN 6 - 20 mg/dL 38(H) - 42(H)  Creatinine 0.61 - 1.24 mg/dL 1.09 - 1.22  Sodium 135 - 145 mmol/L 141 - 138  Potassium 3.5 - 5.1 mmol/L 4.5 4.6 5.2(H)  Chloride 98 - 111 mmol/L 113(H) - 109  CO2 22 - 32 mmol/L 21(L) - 22  Calcium 8.9 - 10.3 mg/dL 7.8(L) - 7.5(L)  Total Protein 6.5 - 8.1 g/dL 5.9(L) - 5.5(L)  Total Bilirubin 0.3 - 1.2 mg/dL 1.8(H) - 1.9(H)  Alkaline Phos 38 - 126 U/L 54 - 53  AST 15 - 41 U/L 68(H) - 59(H)  ALT 0 - 44 U/L 31  - 30    Micro Results: Recent Results (from the past 240 hour(s))  Resp Panel by RT-PCR (Flu A&B, Covid) Nasopharyngeal Swab     Status: None   Collection Time: 08/23/21  8:12 PM   Specimen: Nasopharyngeal Swab; Nasopharyngeal(NP) swabs in vial transport medium  Result Value Ref Range Status   SARS Coronavirus 2 by RT PCR NEGATIVE NEGATIVE Final    Comment: (NOTE) SARS-CoV-2 target nucleic acids are NOT DETECTED.  The SARS-CoV-2 RNA is generally detectable in upper respiratory specimens during the acute phase of infection. The lowest concentration of SARS-CoV-2 viral copies this assay can detect is 138 copies/mL. A negative result does not preclude SARS-Cov-2 infection and should not be used as the sole basis for treatment or other patient management decisions. A negative result may occur with  improper specimen collection/handling, submission of specimen other than nasopharyngeal swab, presence of viral mutation(s) within the areas targeted by this assay, and inadequate number of viral copies(<138 copies/mL). A negative result must be combined with clinical observations, patient history, and epidemiological information. The expected result is Negative.  Fact Sheet for Patients:  EntrepreneurPulse.com.au  Fact Sheet  for Healthcare Providers:  IncredibleEmployment.be  This test is no t yet approved or cleared by the [place] and  has been authorized for detection and/or diagnosis of SARS-CoV-2 by FDA under an Emergency Use Authorization (EUA). This EUA will remain  in effect (meaning this test can be used) for the duration of the COVID-19 declaration under Section 564(b)(1) of the Act, 21 U.S.C.section 360bbb-3(b)(1), unless the authorization is terminated  or revoked sooner.       Influenza A by PCR NEGATIVE NEGATIVE Final   Influenza B by PCR NEGATIVE NEGATIVE Final    Comment: (NOTE) The Xpert Xpress SARS-CoV-2/FLU/RSV plus  assay is intended as an aid in the diagnosis of influenza from Nasopharyngeal swab specimens and should not be used as a sole basis for treatment. Nasal washings and aspirates are unacceptable for Xpert Xpress SARS-CoV-2/FLU/RSV testing.  Fact Sheet for Patients: EntrepreneurPulse.com.au  Fact Sheet for Healthcare Providers: IncredibleEmployment.be  This test is not yet approved or cleared by the [place] FDA and has been authorized for detection and/or diagnosis of SARS-CoV-2 by FDA under an Emergency Use Authorization (EUA). This EUA will remain in effect (meaning this test can be used) for the duration of the COVID-19 declaration under Section 564(b)(1) of the Act, 21 U.S.C. section 360bbb-3(b)(1), unless the authorization is terminated or revoked.  Performed at [facility], [address] [place], [place] [address]   MRSA Next Gen by PCR, Nasal     Status: None   Collection Time: 08/24/21 12:56 PM   Specimen: Nasal Mucosa; Nasal Swab  Result Value Ref Range Status   MRSA by PCR Next Gen NOT DETECTED NOT DETECTED Final    Comment: (NOTE) The GeneXpert MRSA Assay (FDA approved for NASAL specimens only), is one component of a comprehensive MRSA colonization surveillance program. It is not intended to diagnose MRSA infection nor to guide or monitor treatment for MRSA infections. Test performance is not FDA approved in patients less than 36 years old. Performed at [facility], [address] [place], [place] [address]    Studies/Results: CT HEAD WO CONTRAST (5MM)  Result Date: 08/25/2021 CLINICAL DATA:  Encephalopathy EXAM: CT HEAD WITHOUT CONTRAST TECHNIQUE: Contiguous axial images were obtained from the base of the skull through the vertex without intravenous contrast. COMPARISON:  11/09/2016 FINDINGS: Brain: There is no mass, hemorrhage or extra-axial collection. The size and configuration of the ventricles and  extra-axial CSF spaces are normal. The brain parenchyma is normal, without acute or chronic infarction. Vascular: No abnormal hyperdensity of the major intracranial arteries or dural venous sinuses. No intracranial atherosclerosis. Skull: The visualized skull base, calvarium and extracranial soft tissues are normal. Sinuses/Orbits: No fluid levels or advanced mucosal thickening of the visualized paranasal sinuses. No mastoid or middle ear effusion. The orbits are normal. IMPRESSION: Normal head CT. Electronically Signed   By: Ulyses Jarred M.D.   On: 08/25/2021 00:51   Medications: I have reviewed the patient's current medications. Prior to Admission:  Medications Prior to Admission  Medication Sig Dispense Refill Last Dose   furosemide (LASIX) 20 MG tablet Take 3 tablets (60 mg total) by mouth daily. 90 tablet 2 60+ days   nadolol (CORGARD) 20 MG tablet Take 1 tablet (20 mg total) by mouth daily. 30 tablet 2 60+ days   spironolactone (ALDACTONE) 50 MG tablet Take 3 tablets (150 mg total) by mouth daily. 90 tablet 2 60+ days   SV VITAMIN B-12 ER 1000 MCG TBCR Take 1 tablet  by mouth daily.   60+ days   Vitamin D, Ergocalciferol, (DRISDOL) 1.25 MG (50000 UNIT) CAPS capsule Take 1 capsule (50,000 Units total) by mouth every 7 (seven) days. 12 capsule 0 60+ days   Scheduled:  Chlorhexidine Gluconate Cloth  6 each Topical Daily   folic acid  1 mg Intravenous Daily   lactulose  300 mL Rectal Q6H   Continuous:  cefTRIAXone (ROCEPHIN)  IV 2 g (08/25/21 2055)   dextrose 5 % and 0.9% NaCl 50 mL/hr at 08/26/21 0828   octreotide  (SANDOSTATIN)    IV infusion 50 mcg/hr (08/26/21 0900)   ondansetron (ZOFRAN) IV     pantoprazole 8 mg/hr (08/26/21 0900)   thiamine injection 500 mg (08/26/21 0829)   XBM:[id number] sodium, LORazepam, LORazepam **OR** LORazepam, morphine injection, naLOXone (NARCAN)  injection, ondansetron (ZOFRAN) IV Anti-infectives (From admission, onward)    Start     Dose/Rate Route  Frequency Ordered Stop   08/25/21 1000  rifaximin (XIFAXAN) tablet 550 mg  Status:  Discontinued        550 mg Oral 2 times daily 08/25/21 0754 08/25/21 0919   08/24/21 2000  cefTRIAXone (ROCEPHIN) 2 g in sodium chloride 0.9 % 100 mL IVPB        2 g 200 mL/hr over 30 Minutes Intravenous Every 24 hours 08/24/21 0843     08/24/21 1800  cefTRIAXone (ROCEPHIN) 1 g in sodium chloride 0.9 % 100 mL IVPB  Status:  Discontinued        1 g 200 mL/hr over 30 Minutes Intravenous Every 24 hours 08/24/21 0830 08/24/21 0843   08/23/21 2015  cefTRIAXone (ROCEPHIN) 2 g in sodium chloride 0.9 % 100 mL IVPB        2 g 200 mL/hr over 30 Minutes Intravenous  Once 08/23/21 2009 08/23/21 2053      Scheduled Meds:  Chlorhexidine Gluconate Cloth  6 each Topical Daily   folic acid  1 mg Intravenous Daily   lactulose  300 mL Rectal Q6H   Continuous Infusions:  cefTRIAXone (ROCEPHIN)  IV 2 g (08/25/21 2055)   dextrose 5 % and 0.9% NaCl 50 mL/hr at 08/26/21 0828   octreotide  (SANDOSTATIN)    IV infusion 50 mcg/hr (08/26/21 0900)   ondansetron (ZOFRAN) IV     pantoprazole 8 mg/hr (08/26/21 0900)   thiamine injection 500 mg (08/26/21 0829)   PRN Meds:.docusate sodium, LORazepam, LORazepam **OR** LORazepam, morphine injection, naLOXone (NARCAN)  injection, ondansetron (ZOFRAN) IV   Assessment: Principal Problem:   Secondary esophageal varices with bleeding (HCC) Active Problems:   Alcohol abuse   Acute blood loss anemia   Alcoholic cirrhosis of liver without ascites (HCC)   Hyperkalemia   Hypomagnesemia   Thrombocytopenia (HCC)   Acute hepatic encephalopathy  Anthem Frazer is a 55 y.o. male with history of decompensated alcoholic cirrhosis, esophageal variceal bleeding s/p ligation x3 in 02/2021, volume overload presented with hematemesis and melena resulting in acute blood loss anemia  Plan: Hematemesis and melena: with elevated BUN/creatinine ratio S/p EGD on 9/27, found to have large esophageal  varices with red wale signs, s/p ligation x3, also has severe portal hypertensive gastropathy No further episodes of melena or hematemesis since admission Hemoglobin is currently stable, monitor CBC daily, maintain hemoglobin greater than 7, platelets above 50 Check iron panel, B12 and folate levels Continue octreotide and pantoprazole drips for 72 hours, day 3 today Continue ceftriaxone daily for SBP prophylaxis Patient is currently n.p.o., due to hepatic encephalopathy Hold nonselective  beta-blocker in setting of acute GI bleed to prevent acute renal insufficiency  Hepatic encephalopathy Currently on lactulose enemas When able to, start p.o. lactulose 20 g twice daily and rifaximin 550 mg twice daily and continue the same upon discharge If there is no improvement in his mental status, recommend Dobbhoff placement under fluoroscopy and start lactulose, rifaximin as well as tube feeds CT head unremarkable  Decompensated cirrhosis of liver Abdomen is mildly distended, if worsening, watch for ascites and volume overload, currently euvolemic Continue ceftriaxone for SBP prophylaxis No evidence of acute renal failure Restart low-dose diuretics upon discharge    LOS: 3 days   Casondra Gasca 08/26/2021, 9:56 AM

## 2021-08-27 ENCOUNTER — Inpatient Hospital Stay: Payer: BC Managed Care – PPO

## 2021-08-27 DIAGNOSIS — K7030 Alcoholic cirrhosis of liver without ascites: Secondary | ICD-10-CM | POA: Diagnosis not present

## 2021-08-27 DIAGNOSIS — K7200 Acute and subacute hepatic failure without coma: Secondary | ICD-10-CM | POA: Diagnosis not present

## 2021-08-27 DIAGNOSIS — I8511 Secondary esophageal varices with bleeding: Secondary | ICD-10-CM | POA: Diagnosis not present

## 2021-08-27 LAB — CBC
HCT: 23.6 % — ABNORMAL LOW (ref 39.0–52.0)
Hemoglobin: 8 g/dL — ABNORMAL LOW (ref 13.0–17.0)
MCH: 30.7 pg (ref 26.0–34.0)
MCHC: 33.9 g/dL (ref 30.0–36.0)
MCV: 90.4 fL (ref 80.0–100.0)
Platelets: 106 10*3/uL — ABNORMAL LOW (ref 150–400)
RBC: 2.61 MIL/uL — ABNORMAL LOW (ref 4.22–5.81)
RDW: 18.5 % — ABNORMAL HIGH (ref 11.5–15.5)
WBC: 10.3 10*3/uL (ref 4.0–10.5)
nRBC: 0.2 % (ref 0.0–0.2)

## 2021-08-27 LAB — COMPREHENSIVE METABOLIC PANEL
ALT: 34 U/L (ref 0–44)
AST: 67 U/L — ABNORMAL HIGH (ref 15–41)
Albumin: 2.4 g/dL — ABNORMAL LOW (ref 3.5–5.0)
Alkaline Phosphatase: 61 U/L (ref 38–126)
Anion gap: 4 — ABNORMAL LOW (ref 5–15)
BUN: 17 mg/dL (ref 6–20)
CO2: 21 mmol/L — ABNORMAL LOW (ref 22–32)
Calcium: 7.8 mg/dL — ABNORMAL LOW (ref 8.9–10.3)
Chloride: 120 mmol/L — ABNORMAL HIGH (ref 98–111)
Creatinine, Ser: 0.92 mg/dL (ref 0.61–1.24)
GFR, Estimated: >60 mL/min (ref >60)
Glucose, Bld: 136 mg/dL — ABNORMAL HIGH (ref 70–99)
Potassium: 3.5 mmol/L (ref 3.5–5.1)
Sodium: 145 mmol/L (ref 135–145)
Total Bilirubin: 3.2 mg/dL — ABNORMAL HIGH (ref 0.3–1.2)
Total Protein: 6.4 g/dL — ABNORMAL LOW (ref 6.5–8.1)

## 2021-08-27 LAB — LACTIC ACID, PLASMA: Lactic Acid, Venous: 1.9 mmol/L (ref 0.5–1.9)

## 2021-08-27 LAB — MAGNESIUM: Magnesium: 2 mg/dL (ref 1.7–2.4)

## 2021-08-27 LAB — GLUCOSE, CAPILLARY: Glucose-Capillary: 108 mg/dL — ABNORMAL HIGH (ref 70–99)

## 2021-08-27 MED ORDER — SODIUM CHLORIDE 0.9 % IV SOLN
300.0000 mg | Freq: Once | INTRAVENOUS | Status: AC
  Administered 2021-08-27: 300 mg via INTRAVENOUS
  Filled 2021-08-27: qty 300

## 2021-08-27 MED ORDER — NADOLOL 20 MG PO TABS
10.0000 mg | ORAL_TABLET | Freq: Every day | ORAL | Status: DC
  Administered 2021-08-27 – 2021-08-31 (×5): 10 mg via ORAL
  Filled 2021-08-27 (×7): qty 1

## 2021-08-27 MED ORDER — RIFAXIMIN 550 MG PO TABS
550.0000 mg | ORAL_TABLET | Freq: Two times a day (BID) | ORAL | Status: DC
  Administered 2021-08-27 – 2021-09-01 (×11): 550 mg via ORAL
  Filled 2021-08-27 (×12): qty 1

## 2021-08-27 MED ORDER — THIAMINE HCL 100 MG PO TABS
100.0000 mg | ORAL_TABLET | Freq: Every day | ORAL | Status: DC
  Administered 2021-08-28 – 2021-09-01 (×5): 100 mg via ORAL
  Filled 2021-08-27 (×5): qty 1

## 2021-08-27 MED ORDER — PANTOPRAZOLE SODIUM 40 MG PO TBEC
40.0000 mg | DELAYED_RELEASE_TABLET | Freq: Two times a day (BID) | ORAL | Status: DC
  Administered 2021-08-27 – 2021-09-01 (×9): 40 mg via ORAL
  Filled 2021-08-27 (×9): qty 1

## 2021-08-27 MED ORDER — LACTULOSE 10 GM/15ML PO SOLN
20.0000 g | Freq: Two times a day (BID) | ORAL | Status: DC
  Administered 2021-08-27 – 2021-08-29 (×5): 20 g via ORAL
  Filled 2021-08-27 (×5): qty 30

## 2021-08-27 NOTE — Progress Notes (Signed)
Patient ID: [place], male   DOB: 1966/08/03, 55 y.o.   MRN: [id number]  Called earlier about arm swelling and Ultrasound was positive for dvt in brachial vein.  Hesitant on starting anticoagulation so soon after GI Bleed with esophageal varies.  Case discussed with Gastroenterologist Dr Marius Ditch.  Likely patient will need anticoagulation soon but will try and hold off for another day or so.   Dr Leslye Peer

## 2021-08-27 NOTE — Progress Notes (Signed)
[place] Darby, MD [address]  [place]  [name], [place] [address]  Main: [phone]  Fax: [phone] Pager: [phone]   Subjective: Patient is finally awake and following commands, slightly lethargic.  Started on diet. No further episodes of melena or rectal bleeding.  Hemoglobin is stable.  Has been afebrile and hemodynamically stable   Objective: Vital signs in last 24 hours: Vitals:   08/27/21 0800 08/27/21 0900 08/27/21 1000 08/27/21 1100  BP: 139/82 (!) 152/95 123/84 132/80  Pulse: 87 (!) 102 84 84  Resp: 15 (!) 22 18 16   Temp: 98.9 F (37.2 C)     TempSrc: Oral     SpO2: 100% 100% 100% 100%  Weight:      Height:       Weight change:   Intake/Output Summary (Last 24 hours) at 08/27/2021 1421 Last data filed at 08/27/2021 0900 Gross per 24 hour  Intake 2097.72 ml  Output 300 ml  Net 1797.72 ml     Exam: Heart:: Regular rate and rhythm or S1S2 present Lungs: normal and clear to auscultation Abdomen: soft, nontender, normal bowel sounds Does not have any pedal edema Lab Results: CBC Latest Ref Rng & Units 08/27/2021 08/26/2021 08/25/2021  WBC 4.0 - 10.5 K/uL 10.3 8.8 -  Hemoglobin 13.0 - 17.0 g/dL 8.0(L) 8.4(L) 9.5(L)  Hematocrit 39.0 - 52.0 % 23.6(L) 25.5(L) 28.5(L)  Platelets 150 - 400 K/uL 106(L) 89(L) -   CMP Latest Ref Rng & Units 08/27/2021 08/25/2021 08/24/2021  Glucose 70 - 99 mg/dL 136(H) 127(H) -  BUN 6 - 20 mg/dL 17 38(H) -  Creatinine 0.61 - 1.24 mg/dL 0.92 1.09 -  Sodium 135 - 145 mmol/L 145 141 -  Potassium 3.5 - 5.1 mmol/L 3.5 4.5 4.6  Chloride 98 - 111 mmol/L 120(H) 113(H) -  CO2 22 - 32 mmol/L 21(L) 21(L) -  Calcium 8.9 - 10.3 mg/dL 7.8(L) 7.8(L) -  Total Protein 6.5 - 8.1 g/dL 6.4(L) 5.9(L) -  Total Bilirubin 0.3 - 1.2 mg/dL 3.2(H) 1.8(H) -  Alkaline Phos 38 - 126 U/L 61 54 -  AST 15 - 41 U/L 67(H) 68(H) -  ALT 0 - 44 U/L 34 31 -    Micro Results: Recent Results (from the past 240 hour(s))  Resp Panel by RT-PCR (Flu  A&B, Covid) Nasopharyngeal Swab     Status: None   Collection Time: 08/23/21  8:12 PM   Specimen: Nasopharyngeal Swab; Nasopharyngeal(NP) swabs in vial transport medium  Result Value Ref Range Status   SARS Coronavirus 2 by RT PCR NEGATIVE NEGATIVE Final    Comment: (NOTE) SARS-CoV-2 target nucleic acids are NOT DETECTED.  The SARS-CoV-2 RNA is generally detectable in upper respiratory specimens during the acute phase of infection. The lowest concentration of SARS-CoV-2 viral copies this assay can detect is 138 copies/mL. A negative result does not preclude SARS-Cov-2 infection and should not be used as the sole basis for treatment or other patient management decisions. A negative result may occur with  improper specimen collection/handling, submission of specimen other than nasopharyngeal swab, presence of viral mutation(s) within the areas targeted by this assay, and inadequate number of viral copies(<138 copies/mL). A negative result must be combined with clinical observations, patient history, and epidemiological information. The expected result is Negative.  Fact Sheet for Patients:  EntrepreneurPulse.com.au  Fact Sheet for Healthcare Providers:  IncredibleEmployment.be  This test is no t yet approved or cleared by the [place] and  has been authorized  for detection and/or diagnosis of SARS-CoV-2 by FDA under an Emergency Use Authorization (EUA). This EUA will remain  in effect (meaning this test can be used) for the duration of the COVID-19 declaration under Section 564(b)(1) of the Act, 21 U.S.C.section 360bbb-3(b)(1), unless the authorization is terminated  or revoked sooner.       Influenza A by PCR NEGATIVE NEGATIVE Final   Influenza B by PCR NEGATIVE NEGATIVE Final    Comment: (NOTE) The Xpert Xpress SARS-CoV-2/FLU/RSV plus assay is intended as an aid in the diagnosis of influenza from Nasopharyngeal swab specimens  and should not be used as a sole basis for treatment. Nasal washings and aspirates are unacceptable for Xpert Xpress SARS-CoV-2/FLU/RSV testing.  Fact Sheet for Patients: EntrepreneurPulse.com.au  Fact Sheet for Healthcare Providers: IncredibleEmployment.be  This test is not yet approved or cleared by the [place] FDA and has been authorized for detection and/or diagnosis of SARS-CoV-2 by FDA under an Emergency Use Authorization (EUA). This EUA will remain in effect (meaning this test can be used) for the duration of the COVID-19 declaration under Section 564(b)(1) of the Act, 21 U.S.C. section 360bbb-3(b)(1), unless the authorization is terminated or revoked.  Performed at [facility], [address] [place], [place] [address]   MRSA Next Gen by PCR, Nasal     Status: None   Collection Time: 08/24/21 12:56 PM   Specimen: Nasal Mucosa; Nasal Swab  Result Value Ref Range Status   MRSA by PCR Next Gen NOT DETECTED NOT DETECTED Final    Comment: (NOTE) The GeneXpert MRSA Assay (FDA approved for NASAL specimens only), is one component of a comprehensive MRSA colonization surveillance program. It is not intended to diagnose MRSA infection nor to guide or monitor treatment for MRSA infections. Test performance is not FDA approved in patients less than 83 years old. Performed at [facility], [address] [place], [place] [address]    Studies/Results: No results found. Medications: I have reviewed the patient's current medications. Prior to Admission:  Medications Prior to Admission  Medication Sig Dispense Refill Last Dose   furosemide (LASIX) 20 MG tablet Take 3 tablets (60 mg total) by mouth daily. 90 tablet 2 60+ days   nadolol (CORGARD) 20 MG tablet Take 1 tablet (20 mg total) by mouth daily. 30 tablet 2 60+ days   spironolactone (ALDACTONE) 50 MG tablet Take 3 tablets (150 mg total) by mouth daily. 90 tablet  2 60+ days   SV VITAMIN B-12 ER 1000 MCG TBCR Take 1 tablet by mouth daily.   60+ days   Vitamin D, Ergocalciferol, (DRISDOL) 1.25 MG (50000 UNIT) CAPS capsule Take 1 capsule (50,000 Units total) by mouth every 7 (seven) days. 12 capsule 0 60+ days   Scheduled:  Chlorhexidine Gluconate Cloth  6 each Topical Daily   folic acid  1 mg Intravenous Daily   lactulose  20 g Oral BID   nadolol  10 mg Oral QHS   pantoprazole  40 mg Oral BID AC   rifaximin  550 mg Oral BID   [START ON 08/28/2021] thiamine  100 mg Oral Daily   Continuous:  cefTRIAXone (ROCEPHIN)  IV 2 g (08/26/21 2009)   dextrose 5 % and 0.9% NaCl Stopped (08/27/21 0730)   iron sucrose     ondansetron (ZOFRAN) IV     thiamine injection 500 mg (08/27/21 1342)   MPN:[id number] sodium, labetalol, LORazepam, LORazepam **OR** LORazepam, naLOXone (NARCAN)  injection, ondansetron (ZOFRAN) IV Anti-infectives (From admission, onward)  Start     Dose/Rate Route Frequency Ordered Stop   08/27/21 1000  rifaximin (XIFAXAN) tablet 550 mg        550 mg Oral 2 times daily 08/27/21 0821     08/26/21 2200  rifaximin (XIFAXAN) tablet 200 mg  Status:  Discontinued        200 mg Oral 2 times daily 08/26/21 1726 08/27/21 0821   08/25/21 1000  rifaximin (XIFAXAN) tablet 550 mg  Status:  Discontinued        550 mg Oral 2 times daily 08/25/21 0754 08/25/21 0919   08/24/21 2000  cefTRIAXone (ROCEPHIN) 2 g in sodium chloride 0.9 % 100 mL IVPB        2 g 200 mL/hr over 30 Minutes Intravenous Every 24 hours 08/24/21 0843     08/24/21 1800  cefTRIAXone (ROCEPHIN) 1 g in sodium chloride 0.9 % 100 mL IVPB  Status:  Discontinued        1 g 200 mL/hr over 30 Minutes Intravenous Every 24 hours 08/24/21 0830 08/24/21 0843   08/23/21 2015  cefTRIAXone (ROCEPHIN) 2 g in sodium chloride 0.9 % 100 mL IVPB        2 g 200 mL/hr over 30 Minutes Intravenous  Once 08/23/21 2009 08/23/21 2053      Scheduled Meds:  Chlorhexidine Gluconate Cloth  6 each Topical  Daily   folic acid  1 mg Intravenous Daily   lactulose  20 g Oral BID   nadolol  10 mg Oral QHS   pantoprazole  40 mg Oral BID AC   rifaximin  550 mg Oral BID   [START ON 08/28/2021] thiamine  100 mg Oral Daily   Continuous Infusions:  cefTRIAXone (ROCEPHIN)  IV 2 g (08/26/21 2009)   dextrose 5 % and 0.9% NaCl Stopped (08/27/21 0730)   iron sucrose     ondansetron (ZOFRAN) IV     thiamine injection 500 mg (08/27/21 1342)   PRN Meds:.docusate sodium, labetalol, LORazepam, LORazepam **OR** LORazepam, naLOXone (NARCAN)  injection, ondansetron (ZOFRAN) IV   Assessment: Principal Problem:   Secondary esophageal varices with bleeding (HCC) Active Problems:   Alcohol abuse   Acute blood loss anemia   Alcoholic cirrhosis of liver without ascites (HCC)   Hyperkalemia   Hypomagnesemia   Thrombocytopenia (HCC)   Acute hepatic encephalopathy  [place] Martin is a 55 y.o. male with history of decompensated alcoholic cirrhosis, esophageal variceal bleeding s/p ligation x3 in 02/2021, volume overload presented with hematemesis and melena resulting in acute blood loss anemia  Plan: Hematemesis and melena: with elevated BUN/creatinine ratio S/p EGD on 9/27, found to have large esophageal varices with red wale signs, s/p ligation x3, also has severe portal hypertensive gastropathy No further episodes of melena or hematemesis since admission Hemoglobin is currently stable, monitor CBC daily, maintain hemoglobin greater than 7, platelets above 50 Has iron deficiency, recommend parenteral iron Completed 72 hours of octreotide and pantoprazole drips for 72 hours Recommend low-sodium diet Okay to stop ceftriaxone daily for SBP prophylaxis Nadolol has been restarted, AKI has resolved   Hepatic encephalopathy: Improving Currently on lactulose enemas Continue p.o. lactulose 20 g twice daily and rifaximin 550 mg twice daily and continue the same upon discharge   Decompensated cirrhosis of  liver Abdomen is mildly distended, if worsening, watch for ascites and volume overload, currently euvolemic Continue ceftriaxone for SBP prophylaxis No evidence of acute renal failure Restart low-dose diuretics upon discharge Recommend ultrasound with Dopplers to evaluate for portal vein  thrombosis given acute hepatic encephalopathy  Patient should follow-up with GI upon discharge  GI will sign off at this time, please call us back with questions or concerns    LOS: 4 days   [place] 08/27/2021, 2:21 PM

## 2021-08-27 NOTE — Progress Notes (Signed)
Patient ID: Terrence Martin, male   DOB: May 22, 1966, 55 y.o.   MRN: [id number] [facility] PROGRESS NOTE  Oluwaseun Cremer VOZ:[id number] DOB: [birth year] DOA: 08/23/2021 PCP: Pcp, No  HPI/Subjective: Patient able to talk with me today.  Feels okay.  Offers no complaints.  Admitted with altered mental status and upper GI bleed found to have esophageal variceal bleed.  Objective: Vitals:   08/27/21 1400 08/27/21 1500  BP: (!) 148/91 (!) 140/97  Pulse: 88 95  Resp: 19 (!) 23  Temp:    SpO2: 100% 100%    Intake/Output Summary (Last 24 hours) at 08/27/2021 1553 Last data filed at 08/27/2021 1510 Gross per 24 hour  Intake 2892.24 ml  Output 300 ml  Net 2592.24 ml   Filed Weights   08/23/21 1954 08/24/21 1635  Weight: 104.3 kg 104.3 kg    ROS: Review of Systems  Respiratory:  Negative for shortness of breath.   Cardiovascular:  Negative for chest pain.  Gastrointestinal:  Positive for diarrhea. Negative for abdominal pain, nausea and vomiting.  Exam: Physical Exam HENT:     Head: Normocephalic.     Mouth/Throat:     Pharynx: No oropharyngeal exudate.  Eyes:     General: Lids are normal.     Comments: Slight icteric  Cardiovascular:     Rate and Rhythm: Normal rate and regular rhythm.     Heart sounds: Normal heart sounds, S1 normal and S2 normal.  Pulmonary:     Breath sounds: No decreased breath sounds, wheezing, rhonchi or rales.  Abdominal:     Palpations: Abdomen is soft.     Tenderness: There is no abdominal tenderness.  Musculoskeletal:     Right lower leg: Swelling present.     Left lower leg: Swelling present.  Skin:    General: Skin is warm.     Findings: No rash.  Neurological:     Mental Status: He is alert.     Comments: Answer some yes/no questions      Scheduled Meds:  Chlorhexidine Gluconate Cloth  6 each Topical Daily   folic acid  1 mg Intravenous Daily   lactulose  20 g Oral BID   nadolol  10 mg Oral QHS   pantoprazole  40 mg Oral BID AC    rifaximin  550 mg Oral BID   [START ON 08/28/2021] thiamine  100 mg Oral Daily   Continuous Infusions:  cefTRIAXone (ROCEPHIN)  IV 2 g (08/26/21 2009)   dextrose 5 % and 0.9% NaCl 50 mL/hr at 08/27/21 1510   iron sucrose     ondansetron (ZOFRAN) IV     thiamine injection Stopped (08/27/21 1412)    Assessment/Plan:  Acute hepatic encephalopathy.  Now awake enough to take lactulose orally and Xifaxan orally. Esophageal variceal bleed and portal hypertensive gastropathy.  Patient had acute blood loss anemia.  Received 1 unit of packed red blood cells during the hospital course.  Hemoglobin 8.  Patient had esophageal variceal banding.  On empiric Rocephin.  Completed octreotide.  On Protonix. Alcoholic liver cirrhosis with thrombocytopenia.  Continue alcohol withdrawal protocol.  Finish high-dose thiamine today and be over to regular 100 mg Thiamine tomorrow.  Gastroenterology ordered a liver ultrasound.  Spoke with patient that he needs to be alcohol free for 6 months before a transplant would be recommended. Lactic acidosis on gentle IV fluids.  Empiric Rocephin. Hypomagnesemia replaced Hyperkalemia resolved. Physical therapy evaluate     Code Status:     Code Status Orders  (  From admission, onward)           Start     Ordered   08/24/21 0000  Full code  Continuous        08/23/21 2359           Code Status History     Date Active Date Inactive Code Status Order ID Comments User Context   02/17/2021 0256 02/19/2021 2140 Full Code [id number]  Toy Baker, MD Inpatient      Family Communication: Updated girlfriend on the phone Disposition Plan: Status is: Inpatient  Dispo: The patient is from: Home              Anticipated d/c is to: Home              Patient currently now able to talk with me but still not back to baseline yet.   Difficult to place patient.  No.  Consultants: Gastroenterology  Time spent: 28 minutes  [place]

## 2021-08-27 NOTE — Evaluation (Signed)
Physical Therapy Evaluation Patient Details Name: Terrence Martin MRN: [id number] DOB: [birth year] Today's Date: 08/27/2021  History of Present Illness  Pt is a 55 y.o. M arrving to ED due to vomiting blood, admitted for upper GI bleed. PMH includes liver cirrhosis, anemia, peptic ulcer disease, and HTN.  Clinical Impression  Pt alert, oriented to name, DOB, place with delayed responses during task initiation. Pt was previously independent with all mobility and ADLs and currently works. Primary limitations include decreased R sided attention, decreased strength, ROM, and endurance with overall limitations in functional mobility. Pt able to come to EOB w/ supervision and transfer with MIN-A to MOD-A w/ RW w/ increased R lateral trunk lean noted in standing. Pt able to step forwardwith tactile assist for full weight shifting w/ RW, MIN-A, MIN G intermittently when stepping backward. Due to significant changes from PLOF CIR is current discharge recommendation. Skilled PT intervention is indicated to address deficits in function, mobility, and to return to PLOF as able.       Recommendations for follow up therapy are one component of a multi-disciplinary discharge planning process, led by the attending physician.  Recommendations may be updated based on patient status, additional functional criteria and insurance authorization.  Follow Up Recommendations CIR;Supervision for mobility/OOB    Equipment Recommendations  Other (comment) (TBD next venue of care)    Recommendations for Other Services OT consult     Precautions / Restrictions Precautions Precautions: Fall Restrictions Weight Bearing Restrictions: No      Mobility  Bed Mobility Overal bed mobility: Needs Assistance Bed Mobility: Supine to Sit     Supine to sit: Supervision          Transfers Overall transfer level: Needs assistance Equipment used: Rolling walker (2 wheeled) Transfers: Sit to/from Stand Sit to Stand: Min  assist;Mod assist         General transfer comment: MOD A x 1 initial STS secondary to weakness; repositioned with tactile cues to RLE positioning w/ MIN A x 3 w/ pt's preference holding onto middle frame of RW  Ambulation/Gait Ambulation/Gait assistance: Min guard;Min assist Gait Distance (Feet): 2 Feet Assistive device: Rolling walker (2 wheeled) Gait Pattern/deviations: Step-to pattern;Decreased step length - right;Decreased step length - left     General Gait Details: initially required tactile assist to mobilize RLE for scooting foot, but w/ increased awareness and repetition able to lift foot forward & backward  Stairs            Wheelchair Mobility    Modified Rankin (Stroke Patients Only)       Balance Overall balance assessment: Needs assistance Sitting-balance support: Feet supported;Bilateral upper extremity supported Sitting balance-Leahy Scale: Fair Sitting balance - Comments: R trunk lean with tactile assist for correction;     Standing balance-Leahy Scale: Poor Standing balance comment: requires BUE support w/ MIN A or close MIN G for dynamic balance                             Pertinent Vitals/Pain Pain Assessment: Faces Faces Pain Scale: Hurts even more Pain Location: L antecubital Pain Descriptors / Indicators: Sore;Grimacing Pain Intervention(s): Limited activity within patient's tolerance;Monitored during session;Repositioned    Home Living Family/patient expects to be discharged to:: Private residence Living Arrangements: Spouse/significant other Available Help at Discharge: Family;Available 24 hours/day Type of Home: House Home Access: Level entry     Home Layout: One level Home Equipment: None  Prior Function Level of Independence: Independent         Comments: Independent for all ADLs and mobility. Currently works as a Public affairs consultant on nighit shift     Hand Dominance   Dominant Hand: Right    Extremity/Trunk  Assessment   Upper Extremity Assessment Upper Extremity Assessment: LUE deficits/detail LUE Deficits / Details: scapular elevation for shoulder flexion with limited ability to raise UE above 75 degrees; LUE coordination delay F2N LUE Sensation: WNL    Lower Extremity Assessment Lower Extremity Assessment: Generalized weakness       Communication      Cognition Arousal/Alertness: Awake/alert Behavior During Therapy: WFL for tasks assessed/performed Overall Cognitive Status: No family/caregiver present to determine baseline cognitive functioning                                 General Comments: oriented to self, DOB; demonstrates intermittent delayed responses when questioned w/ periods of R sided inattention      General Comments General comments (skin integrity, edema, etc.): L antecubital space edema, RN notified    Exercises     Assessment/Plan    PT Assessment Patient needs continued PT services  PT Problem List Decreased range of motion;Decreased strength;Decreased activity tolerance;Decreased balance;Decreased mobility       PT Treatment Interventions Gait training;Therapeutic exercise;Stair training;Functional mobility training;Therapeutic activities;Neuromuscular re-education;Balance training    PT Goals (Current goals can be found in the Care Plan section)  Acute Rehab PT Goals Patient Stated Goal: to walk PT Goal Formulation: With patient Time For Goal Achievement: 09/10/21 Potential to Achieve Goals: Good    Frequency Min 2X/week   Barriers to discharge        Co-evaluation               AM-PAC PT "6 Clicks" Mobility  Outcome Measure Help needed turning from your back to your side while in a flat bed without using bedrails?: None Help needed moving from lying on your back to sitting on the side of a flat bed without using bedrails?: A Little Help needed moving to and from a bed to a chair (including a wheelchair)?: A Lot Help  needed standing up from a chair using your arms (e.g., wheelchair or bedside chair)?: A Little Help needed to walk in hospital room?: A Lot Help needed climbing 3-5 steps with a railing? : A Lot 6 Click Score: 16    End of Session Equipment Utilized During Treatment: Gait belt Activity Tolerance: Patient tolerated treatment well Patient left: in bed;with call bell/phone within reach;with bed alarm set Nurse Communication: Mobility status PT Visit Diagnosis: Other abnormalities of gait and mobility (R26.89);Muscle weakness (generalized) (M62.81)    Time: 6301-6010 PT Time Calculation (min) (ACUTE ONLY): 28 min   Charges:             [employer], SPT

## 2021-08-27 NOTE — Progress Notes (Signed)
Patient noted to have swelling left lower arm. MD notified and [place] completed; results positive for dvt per Dr Leslye Peer.Marland Kitchen

## 2021-08-28 ENCOUNTER — Inpatient Hospital Stay: Payer: BC Managed Care – PPO

## 2021-08-28 DIAGNOSIS — R531 Weakness: Secondary | ICD-10-CM

## 2021-08-28 DIAGNOSIS — I82622 Acute embolism and thrombosis of deep veins of left upper extremity: Secondary | ICD-10-CM

## 2021-08-28 DIAGNOSIS — K7682 Hepatic encephalopathy: Secondary | ICD-10-CM

## 2021-08-28 LAB — APTT: aPTT: 39 seconds — ABNORMAL HIGH (ref 24–36)

## 2021-08-28 LAB — CBC
HCT: 26.4 % — ABNORMAL LOW (ref 39.0–52.0)
Hemoglobin: 8.5 g/dL — ABNORMAL LOW (ref 13.0–17.0)
MCH: 29.5 pg (ref 26.0–34.0)
MCHC: 32.2 g/dL (ref 30.0–36.0)
MCV: 91.7 fL (ref 80.0–100.0)
Platelets: 114 10*3/uL — ABNORMAL LOW (ref 150–400)
RBC: 2.88 MIL/uL — ABNORMAL LOW (ref 4.22–5.81)
RDW: 18.6 % — ABNORMAL HIGH (ref 11.5–15.5)
WBC: 9.4 10*3/uL (ref 4.0–10.5)
nRBC: 0.5 % — ABNORMAL HIGH (ref 0.0–0.2)

## 2021-08-28 LAB — HEPARIN LEVEL (UNFRACTIONATED): Heparin Unfractionated: <0.1 IU/mL — ABNORMAL LOW (ref 0.30–0.70)

## 2021-08-28 MED ORDER — CEFDINIR 300 MG PO CAPS
300.0000 mg | ORAL_CAPSULE | Freq: Two times a day (BID) | ORAL | Status: DC
  Administered 2021-08-28 – 2021-08-30 (×4): 300 mg via ORAL
  Filled 2021-08-28 (×6): qty 1

## 2021-08-28 MED ORDER — HEPARIN (PORCINE) 25000 UT/250ML-% IV SOLN
500.0000 [IU]/h | INTRAVENOUS | Status: DC
  Administered 2021-08-28 – 2021-08-30 (×2): 500 [IU]/h via INTRAVENOUS
  Filled 2021-08-28 (×2): qty 250

## 2021-08-28 NOTE — Consult Note (Signed)
ANTICOAGULATION CONSULT NOTE -  Consult  Pharmacy Consult for Heparin gtt Indication: DVT (left basilic and brachial veins)   No Known Allergies  Patient Measurements: Height: 5\' 10"  (177.8 cm) Weight: 104.3 kg (230 lb) IBW/kg (Calculated) : 73 Heparin Dosing Weight: 95.2kg  Vital Signs:    Labs: Recent Labs    08/26/21 0503 08/27/21 0503 08/28/21 0558 08/28/21 1232 08/28/21 2006  HGB 8.4* 8.0* 8.5*  --   --   HCT 25.5* 23.6* 26.4*  --   --   PLT 89* 106* 114*  --   --   APTT  --   --   --  39*  --   HEPARINUNFRC  --   --   --   --  <0.10*  CREATININE  --  0.92  --   --   --      Estimated Creatinine Clearance: 107.1 mL/min (by C-G formula based on SCr of 0.92 mg/dL).   Medications: NKDA PTA: no AC/APT  Inpatient: hep gtt (500 un/hr flat rate) Heparin Dosing Weight: 95.2kg  Assessment: 55yo M w/ AMS 2/2 acute hepatic encephalopathy, Esophageal variceal bleed and portal hypertensive gastropathy c/b ABLA. Pharmacy consulted to start hep gtt for UE DVT.   Date Time HL Rate/Comment 10/01 2006 <0.01 500 units/hr     Baseline Labs: aPTT - 39 INR - 1.3 Hgb - 8>8.5 Plts - 106>114  Goal of Therapy:  Flat rate only. Active bleed, only DOWN-titrate, no boluses. Monitor platelets by anticoagulation protocol: Yes   Plan:  Despite upper extremity DVT, pt has Acute/active variceal bleed. Continue flat rate, no bolus, no titrate. Only down-titrate if elevated, NO up-titration until resolved. Heparin level undetectable Continue heparin infusion at 500 units/hr Check anti-Xa level in 8 hours and daily while on heparin Continue to monitor H&H and platelets  Darnelle Bos, PharmD Clinical Pharmacist 08/28/2021,9:53 PM

## 2021-08-28 NOTE — Progress Notes (Signed)
States he wants to go home to check on girlfriend, assisted to chair and then back to bed. Very fatigued now, this RN asked patient if he still wanted to go home, he stated he will stay in hospital.  Will continue to monitor.

## 2021-08-28 NOTE — Consult Note (Signed)
ANTICOAGULATION CONSULT NOTE -  Consult  Pharmacy Consult for Heparin gtt Indication: DVT (left basilic and brachial veins)   No Known Allergies  Patient Measurements: Height: 5\' 10"  (177.8 cm) Weight: 104.3 kg (230 lb) IBW/kg (Calculated) : 73 Heparin Dosing Weight: 95.2kg  Vital Signs: Temp: 98.5 F (36.9 C) (10/01 0400) Temp Source: Oral (10/01 0400) BP: 130/78 (10/01 0400) Pulse Rate: 73 (10/01 0400)  Labs: Recent Labs    08/26/21 0503 08/27/21 0503 08/28/21 0558  HGB 8.4* 8.0* 8.5*  HCT 25.5* 23.6* 26.4*  PLT 89* 106* 114*  CREATININE  --  0.92  --     Estimated Creatinine Clearance: 107.1 mL/min (by C-G formula based on SCr of 0.92 mg/dL).   Medications: NKDA PTA: no AC/APT  Inpatient: hep gtt (500 un/hr flat rate) Heparin Dosing Weight: 95.2kg  Assessment: 55yo M w/ AMS 2/2 acute hepatic encephalopathy, Esophageal variceal bleed and portal hypertensive gastropathy c/b ABLA. Pharmacy consulted to start hep gtt for UE DVT.   Date Time aPTT/HL Rate/Comment       Baseline Labs: aPTT - ordered baseline INR - 1.3 Hgb - 8>8.5 Plts - 106>114  Goal of Therapy:  Flat rate only. Active bleed, only DOWN-titrate, no boluses. Monitor platelets by anticoagulation protocol: Yes   Plan:  Despite upper extremity DVT, pt has Acute/active variceal bleed. Start flat rate, no bolus, no titrate. Only down-titrate if elevated, NO up-titration until resolved. Start heparin infusion at 500 units/hr Check anti-Xa level in 8 hours and daily while on heparin Continue to monitor H&H and platelets  Lorna Dibble, PharmD, [facility] Clinical Pharmacist 08/28/2021,11:31 AM

## 2021-08-28 NOTE — Progress Notes (Signed)
Inpatient Rehab Admissions Coordinator Note:  Per therapy patient was screened for CIR candidacy by Karene Fry, RN.  At this time, patient appears to be a potential candidate for CIR.  I will place an order for rehab consult for full assessment, per our protocol.  Please contact me for any questions.  [phone]

## 2021-08-28 NOTE — Progress Notes (Signed)
PT Cancellation Note  Patient Details Name: Terrence Martin MRN: [id number] DOB: Apr 25, 1966   Cancelled Treatment:     PT hold. Pt with new Dx DVT (08/27/21) and is not on anticoagulation due to GI bleed concerns. Will hold PT per protocols until cleared for participation or on anticoagulants.    Willette Pa 08/28/2021, 9:56 AM

## 2021-08-28 NOTE — Progress Notes (Signed)
Patient ID: Terrence Martin, male   DOB: Sep 18, 1966, 55 y.o.   MRN: [id number] [facility] PROGRESS NOTE  Terrence Martin DZH:[id number] DOB: 1966/10/02 DOA: 08/23/2021 PCP: Pcp, No  HPI/Subjective: Patient complains of some left arm pain.  Patient having some diarrhea.  Patient admitted with variceal bleed and acute hepatic encephalopathy.  Patient was found to have an ultrasound positive for DVT in the brachial vein last night.  Spoke with the patient, his girlfriend, vascular surgery and gastroenterology about blood thinner and the risks.  Objective: Vitals:   08/28/21 0300 08/28/21 0400  BP: (!) 152/91 130/78  Pulse: 81 73  Resp: 18 (!) 21  Temp:  98.5 F (36.9 C)  SpO2: 100% 100%    Intake/Output Summary (Last 24 hours) at 08/28/2021 1254 Last data filed at 08/28/2021 0400 Gross per 24 hour  Intake 1545.56 ml  Output 975 ml  Net 570.56 ml   Filed Weights   08/23/21 1954 08/24/21 1635  Weight: 104.3 kg 104.3 kg    ROS: Review of Systems  Respiratory:  Negative for shortness of breath.   Cardiovascular:  Negative for chest pain.  Gastrointestinal:  Negative for abdominal pain, nausea and vomiting.  Exam: Physical Exam HENT:     Head: Normocephalic.     Mouth/Throat:     Pharynx: No oropharyngeal exudate.  Eyes:     General: Lids are normal.     Comments: Slight icteric  Cardiovascular:     Rate and Rhythm: Normal rate and regular rhythm.     Heart sounds: Normal heart sounds, S1 normal and S2 normal.  Pulmonary:     Breath sounds: Examination of the right-lower field reveals decreased breath sounds. Examination of the left-lower field reveals decreased breath sounds. Decreased breath sounds present. No wheezing, rhonchi or rales.  Abdominal:     Palpations: Abdomen is soft.     Tenderness: There is no abdominal tenderness.  Musculoskeletal:     Right lower leg: Swelling present.     Left lower leg: Swelling present.  Skin:    General: Skin is warm.     Findings:  No rash.  Neurological:     Mental Status: He is alert.     Comments: Answers questions appropriately and moving all extremities.      Scheduled Meds:  Chlorhexidine Gluconate Cloth  6 each Topical Daily   folic acid  1 mg Intravenous Daily   lactulose  20 g Oral BID   nadolol  10 mg Oral QHS   pantoprazole  40 mg Oral BID AC   rifaximin  550 mg Oral BID   thiamine  100 mg Oral Daily   Continuous Infusions:  cefTRIAXone (ROCEPHIN)  IV 2 g (08/27/21 1948)   heparin 500 Units/hr (08/28/21 1149)   ondansetron (ZOFRAN) IV      Assessment/Plan:  Acute hepatic encephalopathy.  Patient's mental status is improved since coming into the hospital but still not back to baseline yet.  Continue lactulose twice a day.  Goal 3 bowel movements a day.  Continue Xifaxan. Esophageal variceal bleed and portal hypertensive gastropathy.  Patient also with acute blood loss anemia received 1 unit of packed red blood cells during the hospital course.  Received IV iron.  Hemoglobin 8.5.  Patient had esophageal variceal banding on 08/24/2021.  Completed octreotide.  On Protonix.  Soft diet.  Empiric Rocephin switched over to [facility] for this evening. Left arm DVT likely from prior IV site extending into the left brachial vein.  Case  discussed with gastroenterology, vascular surgery, patient and patient's girlfriend.  We have decided to do low-dose heparin without bolus and titration up.  Can titrate down if need be.  We will repeat a sonogram in a few days before deciding to start Eliquis.  Patient high risk of bleeding with recent esophageal variceal bleed.  Continue to watch closely.  For any signs of bleeding heparin drip will have to be stopped. Alcoholic liver cirrhosis with thrombocytopenia.  Patient completed high-dose thiamine while here in the hospital will be on 100 mg thiamine from their own.  Spoke with the patient and patient's girlfriend and will need to be alcohol free for 6 months before transplant  would be recommended. Hypomagnesemia and hyperkalemia on presentation.  These have improved. Weakness.  Physical therapy can walk with the patient even on heparin drip.      Code Status:     Code Status Orders  (From admission, onward)           Start     Ordered   08/24/21 0000  Full code  Continuous        08/23/21 2359           Code Status History     Date Active Date Inactive Code Status Order ID Comments User Context   02/17/2021 0256 02/19/2021 2140 Full Code [id number]  Toy Baker, MD Inpatient      Family Communication: Spoke with girlfriend on the phone Disposition Plan: Status is: Inpatient  Dispo: The patient is from: Home              Anticipated d/c is to: Home              Patient currently starting heparin drip with recent esophageal bradycardia.  Patient is a high risk for rebleed.  Continue to monitor closely.   Difficult to place patient.  No  Consultants: Gastroenterology  Procedures: EGD with esophageal varices banding  Antibiotics: Rocephin switched over to [place]:  Time spent: 28 minutes  [place]

## 2021-08-29 LAB — CBC
HCT: 23.4 % — ABNORMAL LOW (ref 39.0–52.0)
Hemoglobin: 7.8 g/dL — ABNORMAL LOW (ref 13.0–17.0)
MCH: 29.9 pg (ref 26.0–34.0)
MCHC: 33.3 g/dL (ref 30.0–36.0)
MCV: 89.7 fL (ref 80.0–100.0)
Platelets: 143 10*3/uL — ABNORMAL LOW (ref 150–400)
RBC: 2.61 MIL/uL — ABNORMAL LOW (ref 4.22–5.81)
RDW: 17.8 % — ABNORMAL HIGH (ref 11.5–15.5)
WBC: 9.6 10*3/uL (ref 4.0–10.5)
nRBC: 0.9 % — ABNORMAL HIGH (ref 0.0–0.2)

## 2021-08-29 LAB — HEPARIN LEVEL (UNFRACTIONATED): Heparin Unfractionated: <0.1 IU/mL — ABNORMAL LOW (ref 0.30–0.70)

## 2021-08-29 LAB — COMPREHENSIVE METABOLIC PANEL
ALT: 29 U/L (ref 0–44)
AST: 55 U/L — ABNORMAL HIGH (ref 15–41)
Albumin: 2.2 g/dL — ABNORMAL LOW (ref 3.5–5.0)
Alkaline Phosphatase: 64 U/L (ref 38–126)
Anion gap: 9 (ref 5–15)
BUN: 13 mg/dL (ref 6–20)
CO2: 19 mmol/L — ABNORMAL LOW (ref 22–32)
Calcium: 7.5 mg/dL — ABNORMAL LOW (ref 8.9–10.3)
Chloride: 109 mmol/L (ref 98–111)
Creatinine, Ser: 0.93 mg/dL (ref 0.61–1.24)
GFR, Estimated: >60 mL/min (ref >60)
Glucose, Bld: 95 mg/dL (ref 70–99)
Potassium: 3.1 mmol/L — ABNORMAL LOW (ref 3.5–5.1)
Sodium: 137 mmol/L (ref 135–145)
Total Bilirubin: 2.3 mg/dL — ABNORMAL HIGH (ref 0.3–1.2)
Total Protein: 6 g/dL — ABNORMAL LOW (ref 6.5–8.1)

## 2021-08-29 LAB — HEMOGLOBIN: Hemoglobin: 7.8 g/dL — ABNORMAL LOW (ref 13.0–17.0)

## 2021-08-29 MED ORDER — LACTULOSE 10 GM/15ML PO SOLN
30.0000 g | Freq: Two times a day (BID) | ORAL | Status: DC
  Administered 2021-08-29 – 2021-09-01 (×6): 30 g via ORAL
  Filled 2021-08-29 (×6): qty 60

## 2021-08-29 MED ORDER — POTASSIUM CHLORIDE CRYS ER 20 MEQ PO TBCR
40.0000 meq | EXTENDED_RELEASE_TABLET | Freq: Once | ORAL | Status: AC
  Administered 2021-08-29: 40 meq via ORAL
  Filled 2021-08-29: qty 2

## 2021-08-29 NOTE — NC FL2 (Signed)
[place] LEVEL OF CARE SCREENING TOOL     IDENTIFICATION  Patient Name: Terrence Martin Birthdate: 05/05/1966 Sex: male Admission Date (Current Location): 08/23/2021  [facility] and [place] Number:  Engineering geologist and Address:  [facility], [address] [place], [place] [address]      Provider Number: [id number]  Attending Physician Name and Address:  Loletha Grayer, MD  Relative Name and Phone Number:  Arleta Creek [phone] [facility])    Current Level of Care: Hospital Recommended Level of Care: [place] Prior Approval Number:    Date Approved/Denied:   PASRR Number: [id number]  Discharge Plan: SNF    Current Diagnoses: Patient Active Problem List   Diagnosis Date Noted   Arm DVT (deep venous thromboembolism), acute, left (HCC)    Weakness    Acute hepatic encephalopathy    Hyperkalemia 08/23/2021   Hypomagnesemia 08/23/2021   Thrombocytopenia ([place]) 08/23/2021   Esophageal varices without bleeding ([place])    Screening for colon cancer    Alcoholic cirrhosis of liver without ascites ([place])    Alcohol abuse 02/17/2021   Hyperglycemia 02/17/2021   Hypovolemic shock ([place]) 02/17/2021   Acute blood loss anemia 02/17/2021   Secondary esophageal varices with bleeding ([place])    Pre-op evaluation    Elevated troponin    Upper GI bleed 02/16/2021   Anemia 02/16/2021   IDA (iron deficiency anemia) 08/24/2020   Alcohol use 08/24/2020   Transaminitis 08/24/2020   GIB (gastrointestinal bleeding) 06/08/2020    Orientation RESPIRATION BLADDER Height & Weight     Self, Time, Situation, Place  Normal Continent Weight: 230 lb (104.3 kg) Height:  5\' 10"  (177.8 cm)  BEHAVIORAL SYMPTOMS/MOOD NEUROLOGICAL BOWEL NUTRITION STATUS      Continent Diet  AMBULATORY STATUS COMMUNICATION OF NEEDS Skin   Extensive Assist Verbally Normal                       Personal Care Assistance Level of Assistance   Bathing, Feeding, Dressing, Total care Bathing Assistance: Limited assistance Feeding assistance: Limited assistance Dressing Assistance: Limited assistance Total Care Assistance: Limited assistance   Functional Limitations Info  Sight, Hearing, Speech Sight Info: Adequate Hearing Info: Adequate Speech Info: Adequate    SPECIAL CARE FACTORS FREQUENCY  OT (By licensed OT), PT (By licensed PT)     PT Frequency: 5X per week OT Frequency: 5X per week            Contractures Contractures Info: Not present    Additional Factors Info                  Current Medications (08/29/2021):  This is the current hospital active medication list Current Facility-Administered Medications  Medication Dose Route Frequency Provider Last Rate Last Admin   cefdinir (OMNICEF) capsule 300 mg  300 mg Oral Q12H Wieting, Richard, MD   300 mg at 08/29/21 1011   Chlorhexidine Gluconate Cloth 2 % PADS 6 each  6 each Topical Daily Elwyn Reach, MD   6 each at 08/28/21 0954   docusate sodium (COLACE) capsule 200 mg  200 mg Oral BID PRN Wyvonnia Dusky, MD       folic acid injection 1 mg  1 mg Intravenous Daily Loletha Grayer, MD   1 mg at 08/29/21 0905   heparin ADULT infusion 100 units/mL (25000 units/26mL)  500 Units/hr Intravenous Continuous Lorna Dibble, RPH 5 mL/hr at 08/29/21 1200 500 Units/hr  at 08/29/21 1200   labetalol (NORMODYNE) injection 10 mg  10 mg Intravenous Q4H PRN Loletha Grayer, MD       lactulose (CHRONULAC) 10 GM/15ML solution 20 g  20 g Oral BID Loletha Grayer, MD   20 g at 08/29/21 0901   LORazepam (ATIVAN) injection 1-2 mg  1-2 mg Intravenous Q1H PRN Wyvonnia Dusky, MD   2 mg at 08/26/21 2327   nadolol (CORGARD) tablet 10 mg  10 mg Oral QHS Wieting, Richard, MD   10 mg at 08/28/21 2112   naloxone [facility]) injection 0.4 mg  0.4 mg Intravenous PRN Mansy, Jan A, MD   0.4 mg at 08/24/21 2334   ondansetron (ZOFRAN) 8 mg in sodium chloride 0.9 % 50 mL IVPB  8  mg Intravenous Q8H PRN Wyvonnia Dusky, MD       pantoprazole (PROTONIX) EC tablet 40 mg  40 mg Oral BID AC Lin Landsman, MD   40 mg at 08/29/21 9432   rifaximin Doreene Nest) tablet 550 mg  550 mg Oral BID Loletha Grayer, MD   550 mg at 08/29/21 0908   thiamine tablet 100 mg  100 mg Oral Daily Loletha Grayer, MD   100 mg at 08/29/21 7614     Discharge Medications: Please see discharge summary for a list of discharge medications.  Relevant Imaging Results:  Relevant Lab Results:   Additional Information SS# [id number]  Adelene Amas, LCSWA

## 2021-08-29 NOTE — Progress Notes (Signed)
Patient ID: Terrence Martin, male   DOB: 07-Mar-1966, 55 y.o.   MRN: [id number] [facility] PROGRESS NOTE  Terrence Martin DJS:[id number] DOB: Sep 24, 1966 DOA: 08/23/2021 PCP: Pcp, No  HPI/Subjective: Patient feels okay.  Has some left arm pain.  No shortness of breath or chest pain.  Tolerating diet.  Admitted with variceal bleed and hepatic encephalopathy.  Patient found to have a left arm DVT.  Objective: Vitals:   08/29/21 1400 08/29/21 1500  BP: 129/79   Pulse: 87 (!) 109  Resp: (!) 23 (!) 22  Temp:    SpO2: 100% 95%    Intake/Output Summary (Last 24 hours) at 08/29/2021 1552 Last data filed at 08/29/2021 1500 Gross per 24 hour  Intake 358.93 ml  Output 600 ml  Net -241.07 ml    Filed Weights   08/23/21 1954 08/24/21 1635  Weight: 104.3 kg 104.3 kg    ROS: Review of Systems  Respiratory:  Negative for shortness of breath.   Cardiovascular:  Negative for chest pain.  Gastrointestinal:  Negative for abdominal pain, nausea and vomiting.  Exam: Physical Exam HENT:     Head: Normocephalic.     Mouth/Throat:     Pharynx: No oropharyngeal exudate.  Eyes:     General: Lids are normal.     Conjunctiva/sclera: Conjunctivae normal.  Cardiovascular:     Rate and Rhythm: Normal rate and regular rhythm.     Heart sounds: Normal heart sounds, S1 normal and S2 normal.  Pulmonary:     Breath sounds: No decreased breath sounds, wheezing, rhonchi or rales.  Abdominal:     Palpations: Abdomen is soft.     Tenderness: There is no abdominal tenderness.  Musculoskeletal:     Left upper leg: Swelling present.     Right lower leg: No swelling.     Left lower leg: No swelling.  Skin:    General: Skin is warm.     Findings: No rash.  Neurological:     Mental Status: He is alert.      Scheduled Meds:  cefdinir  300 mg Oral Q12H   Chlorhexidine Gluconate Cloth  6 each Topical Daily   folic acid  1 mg Intravenous Daily   lactulose  20 g Oral BID   nadolol  10 mg Oral QHS    pantoprazole  40 mg Oral BID AC   rifaximin  550 mg Oral BID   thiamine  100 mg Oral Daily   Continuous Infusions:  heparin 500 Units/hr (08/29/21 1500)   ondansetron (ZOFRAN) IV      Assessment/Plan:  Acute hepatic encephalopathy.  Mental status improved with starting lactulose and Xifaxan.   Esophageal variceal bleed and portal hypertensive gastropathy.  Patient also had acute blood loss anemia receiving 1 unit of packed red blood cells.  Hemoglobin drifted down to 7.8.  Continue to watch closely.  Patient had esophageal variceal banding on 08/24/2021.  Completed octreotide.  On Protonix and soft diet. Left arm DVT from prior IV site and left brachial vein.  Currently on low-dose heparin drip.  Hopefully will be able to switch to Eliquis soon but would like to reimage arm. Alcoholic liver cirrhosis with thrombocytopenia.  Patient completed high-dose thiamine while here in the hospital.  Patient will need to be alcohol free for 6 months in order to be a candidate for liver transplant.  Platelet count has come up to 143,000. Hypomagnesemia and hyperkalemia on presentation Weakness.  Physical therapy recommending CIR.    Code Status:  Code Status Orders  (From admission, onward)           Start     Ordered   08/24/21 0000  Full code  Continuous        08/23/21 2359           Code Status History     Date Active Date Inactive Code Status Order ID Comments User Context   02/17/2021 0256 02/19/2021 2140 Full Code [id number]  Toy Baker, MD Inpatient      Family Communication: Spoke with girlfriend on the phone Disposition Plan: Status is: Inpatient  Dispo: The patient is from: Home              Anticipated d/c is to: Home              Patient currently continuing heparin drip left arm DVT.  Patient is a high risk for rebleed with recent esophageal bleeding.  Continue to monitor closely.   Difficult to place patient.   No  Consultants: Gastroenterology  Procedures: EGD with esophageal varices banding  Antibiotics: Omnicef:  Time spent: 27 minutes  Veronica Guerrant [employer]

## 2021-08-29 NOTE — Evaluation (Signed)
Occupational Therapy Evaluation Patient Details Name: Chett Taniguchi MRN: [id number] DOB: [birth year] Today's Date: 08/29/2021   History of Present Illness Pt is a 55 y.o. M arrving to ED due to vomiting blood, admitted for upper GI bleed. PMH includes liver cirrhosis, anemia, peptic ulcer disease, and HTN. Found to have DVT in the L brachial vein on 9/30 (now s/p low-dose heparin drip for x2 days; okay to mobilize per MD)   Clinical Impression   Pt seen for OT evaluation this date (s/p low-dose heparin drip for x2 days following LUE DVT; okay to mobilize per MD). Upon arrival to room, pt awake and seated upright in bed. Pt A&Ox4, reporting no pain, and agreeable to OT evaluation. Prior to admission, pt was independent in all ADLs and functional mobility, living in a 1-story home with significant other. Prior to admission, pt was working at [employer]. Pt currently presents with decreased strength, decreased activity tolerance, and impaired balance. Due to these functional impairments, pt requires SUPERVISION for bed mobility, MIN GUARD for stand pivot transfers (2x, towards R side), MOD/MAX A for sit>stand LB ADLs, and SUPERVISION/SET-UP for seated UB ADLs. No R sided inattention or sensory impairments noted during assessment this date. Pt would benefit from additional skilled OT services to maximize return to PLOF. Upon discharge, recommend CIR.     Recommendations for follow up therapy are one component of a multi-disciplinary discharge planning process, led by the attending physician.  Recommendations may be updated based on patient status, additional functional criteria and insurance authorization.   Follow Up Recommendations  CIR    Equipment Recommendations  Other (comment) (defer to next venue of care)       Precautions / Restrictions Precautions Precautions: Fall Restrictions Weight Bearing Restrictions: No      Mobility Bed Mobility Overal bed mobility: Needs Assistance Bed Mobility:  Supine to Sit     Supine to sit: Supervision;HOB elevated          Transfers Overall transfer level: Needs assistance Equipment used: Rolling walker (2 wheeled) Transfers: Sit to/from Stand Sit to Stand: Min guard;From elevated surface              Balance Overall balance assessment: Needs assistance Sitting-balance support: Feet supported;Bilateral upper extremity supported Sitting balance-Leahy Scale: Fair Sitting balance - Comments: fair static sitting balance at EOB. no LOB or trunk lean observed   Standing balance support: Bilateral upper extremity supported;During functional activity Standing balance-Leahy Scale: Fair Standing balance comment: with BUE support from RW, requires MIN GUARD for stand pivot transfers                           ADL either performed or assessed with clinical judgement   ADL Overall ADL's : Needs assistance/impaired                         Toilet Transfer: Min guard;Stand-pivot;BSC   Toileting- Clothing Manipulation and Hygiene: Maximal assistance;Sit to/from stand Toileting - Clothing Manipulation Details (indicate cue type and reason): MAX A for posterior peri-care     Functional mobility during ADLs: Min guard;Rolling walker (stand pivot transfer BSC>recliner) General ADL Comments: SUPERVISION/SET-UP for seated UB ADLs. MOD/MAX A for sit>stand LB ADLs.     Vision Patient Visual Report: No change from baseline Vision Assessment?: No apparent visual deficits            Pertinent Vitals/Pain Pain Assessment: No/denies pain  Hand Dominance Right   Extremity/Trunk Assessment Upper Extremity Assessment Upper Extremity Assessment: Overall WFL for tasks assessed (did not formally assess strength in setting of recent LUE DVT and RUE receiving heparin infusion at time of eval, however grossly [facility] for tasks assessed. Pt denying any sensory impairments)   Lower Extremity Assessment Lower Extremity  Assessment: Generalized weakness   Cervical / Trunk Assessment Cervical / Trunk Assessment: Normal      Cognition Arousal/Alertness: Awake/alert Behavior During Therapy: WFL for tasks assessed/performed Overall Cognitive Status: No family/caregiver present to determine baseline cognitive functioning                                 General Comments: A&Ox4. Pleasant and agreeable throughout. Requires increased processing time   General Comments  Some leakage at RUE IV site    Exercises Other Exercises Other Exercises: educated pt on role of OT, POC, and d/c recommendations        Home Living Family/patient expects to be discharged to:: Private residence Living Arrangements: Spouse/significant other Available Help at Discharge: Family;Available 24 hours/day Type of Home: House Home Access: Level entry     Home Layout: One level               Home Equipment: None          Prior Functioning/Environment Level of Independence: Independent        Comments: Independent for all ADLs and mobility. Works at [employer] List: Decreased strength;Decreased activity tolerance;Impaired balance (sitting and/or standing);Decreased cognition      OT Treatment/Interventions: Self-care/ADL training;Therapeutic exercise;Energy conservation;DME and/or AE instruction;Therapeutic activities;Patient/family education;Balance training    OT Goals(Current goals can be found in the care plan section) Acute Rehab OT Goals Patient Stated Goal: to get washed up OT Goal Formulation: With patient Time For Goal Achievement: 09/12/21 Potential to Achieve Goals: Good ADL Goals Pt Will Perform Grooming: with set-up;with supervision;standing Pt Will Transfer to Toilet: with supervision;ambulating;regular height toilet Pt Will Perform Toileting - Clothing Manipulation and hygiene: with min guard assist;sit to/from stand  OT Frequency: Min 3X/week    AM-PAC OT "6  Clicks" Daily Activity     Outcome Measure Help from another person eating meals?: A Little Help from another person taking care of personal grooming?: A Little Help from another person toileting, which includes using toliet, bedpan, or urinal?: A Lot Help from another person bathing (including washing, rinsing, drying)?: A Lot Help from another person to put on and taking off regular upper body clothing?: A Little Help from another person to put on and taking off regular lower body clothing?: A Lot 6 Click Score: 15   End of Session Equipment Utilized During Treatment: Gait belt;Rolling walker Nurse Communication: Mobility status  Activity Tolerance: Patient tolerated treatment well Patient left: in chair;with call bell/phone within reach;with nursing/sitter in room  OT Visit Diagnosis: Unsteadiness on feet (R26.81);Muscle weakness (generalized) (M62.81);Other symptoms and signs involving cognitive function                Time: 1440-1517 OT Time Calculation (min): 37 min Charges:  OT General Charges $OT Visit: 1 Visit OT Evaluation $OT Eval Moderate Complexity: 1 Mod OT Treatments $Self Care/Home Management : 23-37 mins  Fredirick Maudlin, OTR/L [place]

## 2021-08-29 NOTE — TOC Progression Note (Signed)
Transition of Care [facility]) - Progression Note    Patient Details  Name: Terrence Martin MRN: [id number] Date of Birth: July 31, 1966  Transition of Care [facility]) CM/SW [place], [place] Phone Number: [phone] 08/29/2021, 1:47 PM  Clinical Narrative:     Patient is awake but lethargic. Patient's ultrasound found DVT in the brachial vein.  Attending spoke with patient and main contact, Arleta Creek (Friend)  [phone] [facility]) about vascular surgery, gastroenterology and blood thinners and risk associated with each. Patient remains FULL CODE. PT recommended CIR when ready to discharge.        Expected Discharge Plan and Services                                                 Social Determinants of Health (SDOH) Interventions    Readmission Risk Interventions No flowsheet data found.

## 2021-08-29 NOTE — Consult Note (Signed)
ANTICOAGULATION CONSULT NOTE -  Consult  Pharmacy Consult for Heparin gtt Indication: DVT (left basilic and brachial veins)   No Known Allergies  Patient Measurements: Height: 5\' 10"  (177.8 cm) Weight: 104.3 kg (230 lb) IBW/kg (Calculated) : 73 Heparin Dosing Weight: 95.2kg  Vital Signs: Temp: 98.8 F (37.1 C) (10/01 2000) Temp Source: Oral (10/01 2000) BP: 122/76 (10/02 0500) Pulse Rate: 88 (10/02 0500)  Labs: Recent Labs    08/27/21 0503 08/28/21 0558 08/28/21 1232 08/28/21 2006 08/29/21 0543  HGB 8.0* 8.5*  --   --  7.8*  HCT 23.6* 26.4*  --   --  23.4*  PLT 106* 114*  --   --  143*  APTT  --   --  39*  --   --   HEPARINUNFRC  --   --   --  <0.10* <0.10*  CREATININE 0.92  --   --   --   --      Estimated Creatinine Clearance: 107.1 mL/min (by C-G formula based on SCr of 0.92 mg/dL).   Medications: NKDA PTA: no AC/APT  Inpatient: hep gtt (500 un/hr flat rate) Heparin Dosing Weight: 95.2kg  Assessment: 55yo M w/ AMS 2/2 acute hepatic encephalopathy, Esophageal variceal bleed and portal hypertensive gastropathy c/b ABLA. Pharmacy consulted to start hep gtt for UE DVT.   Date Time HL Rate/Comment 10/01 2006 <0.01 500 units/hr  10/02  0543    <0.01   500 units/hr    Baseline Labs: aPTT - 39 INR - 1.3 Hgb - 8>8.5 Plts - 106>114  Goal of Therapy:  Flat rate only. Active bleed, only DOWN-titrate, no boluses. Monitor platelets by anticoagulation protocol: Yes   Plan:  Despite upper extremity DVT, pt has Acute/active variceal bleed. Continue flat rate, no bolus, no titrate. Only down-titrate if elevated, NO up-titration until resolved. Heparin level undetectable Continue heparin infusion at 500 units/hr Check anti-Xa daily while on heparin Continue to monitor H&H and platelets  Bianney Rockwood D, PharmD Clinical Pharmacist 08/29/2021,7:08 AM

## 2021-08-30 LAB — CBC
HCT: 22.3 % — ABNORMAL LOW (ref 39.0–52.0)
Hemoglobin: 7.5 g/dL — ABNORMAL LOW (ref 13.0–17.0)
MCH: 29.9 pg (ref 26.0–34.0)
MCHC: 33.6 g/dL (ref 30.0–36.0)
MCV: 88.8 fL (ref 80.0–100.0)
Platelets: 165 10*3/uL (ref 150–400)
RBC: 2.51 MIL/uL — ABNORMAL LOW (ref 4.22–5.81)
RDW: 17.9 % — ABNORMAL HIGH (ref 11.5–15.5)
WBC: 8.7 10*3/uL (ref 4.0–10.5)
nRBC: 0.3 % — ABNORMAL HIGH (ref 0.0–0.2)

## 2021-08-30 LAB — PREPARE RBC (CROSSMATCH)

## 2021-08-30 LAB — HEPARIN LEVEL (UNFRACTIONATED): Heparin Unfractionated: <0.1 IU/mL — ABNORMAL LOW (ref 0.30–0.70)

## 2021-08-30 MED ORDER — SODIUM CHLORIDE 0.9% IV SOLUTION: Freq: Once | INTRAVENOUS | Status: DC

## 2021-08-30 MED ORDER — ACETAMINOPHEN 325 MG PO TABS
650.0000 mg | ORAL_TABLET | Freq: Once | ORAL | Status: AC
  Administered 2021-08-30: 650 mg via ORAL
  Filled 2021-08-30: qty 2

## 2021-08-30 MED ORDER — FUROSEMIDE 10 MG/ML IJ SOLN
20.0000 mg | Freq: Once | INTRAMUSCULAR | Status: AC
  Administered 2021-08-30: 20 mg via INTRAVENOUS
  Filled 2021-08-30: qty 4

## 2021-08-30 MED ORDER — SODIUM CHLORIDE 0.9 % IV SOLN
400.0000 mg | Freq: Once | INTRAVENOUS | Status: AC
  Administered 2021-08-30: 400 mg via INTRAVENOUS
  Filled 2021-08-30: qty 20

## 2021-08-30 NOTE — Progress Notes (Signed)
Inpatient Rehab Admissions Coordinator:    Per PT, they are changing their recs and no longer feel Pt. Is appropriate for CIR. CIR will sign off.   Clemens Catholic, [place], [place] Admissions Coordinator  [phone] ([place]) [phone] (office)

## 2021-08-30 NOTE — TOC Progression Note (Signed)
Transition of Care [facility]) - Progression Note    Patient Details  Name: Terrence Martin MRN: [id number] Date of Birth: 1966/04/08  Transition of Care [facility]) CM/SW Contact  Beverly Sessions, RN Phone Number: 08/30/2021, 10:27 AM  Clinical Narrative:    Reached out to CIR admission to determine is rehab consult has been arranged  Reached out to MD to determine anticipated date ready for discharge         Expected Discharge Plan and Services                                                 Social Determinants of Health (SDOH) Interventions    Readmission Risk Interventions No flowsheet data found.

## 2021-08-30 NOTE — Progress Notes (Signed)
Physical Therapy Treatment Patient Details Name: Terrence Martin MRN: [id number] DOB: 09/16/1966 Today's Date: 08/30/2021   History of Present Illness Pt is a 55 y.o. M arrving to ED due to vomiting blood, admitted for upper GI bleed. PMH includes liver cirrhosis, anemia, peptic ulcer disease, and HTN. Found to have DVT in the L brachial vein on 9/30 (now s/p low-dose heparin drip for x2 days; okay to mobilize per MD)    PT Comments    Marked improvement in alertness and overall cognition, functional ability and overall activity tolerance this date.  Completes all transfers, full lap around nursing station (x2) and stairs (L acending rail) with no greater than supervision level of assist from therapist.  Good UE/LE strength and coordination-no focal weakness, visual preference/inattention noted.  Patient endorses feeling near baseline level of ability; girlfriend at bedside verifies.  Will maintain on caseload for one additional visit to ensure continued progression of mobility; if progress continues and all goals are met, will discontinue PT at that time.  Discharge recommendations updated given above-noted progress; TOC/MD informed and aware.     Recommendations for follow up therapy are one component of a multi-disciplinary discharge planning process, led by the attending physician.  Recommendations may be updated based on patient status, additional functional criteria and insurance authorization.  Follow Up Recommendations  No PT follow up     Equipment Recommendations       Recommendations for Other Services       Precautions / Restrictions Precautions Precautions: Fall Restrictions Weight Bearing Restrictions: No     Mobility  Bed Mobility Overal bed mobility: Independent                  Transfers Overall transfer level: Modified independent Equipment used: None             General transfer comment: good LE strength/power; no assist device, physical assist  from therapist required  Ambulation/Gait Ambulation/Gait assistance: Supervision Gait Distance (Feet): 400 Feet Assistive device: None       General Gait Details: reciprocal stepping pattern with good step height/length; mild sway bilat, but no overt bucklig, LOB or safety concerns.  Completes dynamic gait components (head turns, start/stop, change of direction) without difficulty.   Stairs Stairs: Yes Stairs assistance: Supervision   Number of Stairs: 3 General stair comments: L descending; step through gait pattern. Good LE strength/control   Wheelchair Mobility    Modified Rankin (Stroke Patients Only)       Balance Overall balance assessment: Needs assistance Sitting-balance support: No upper extremity supported;Feet supported Sitting balance-Leahy Scale: Normal     Standing balance support: No upper extremity supported Standing balance-Leahy Scale: Good                              Cognition Arousal/Alertness: Awake/alert Behavior During Therapy: WFL for tasks assessed/performed Overall Cognitive Status: Within Functional Limits for tasks assessed                                 General Comments: Alert and oriented, follows commands; agreeable to participation with session.  Girlfriend at bedside, supportive and encouraging.  Very mild delay in processing at times, but easily redirectable      Exercises      General Comments        Pertinent Vitals/Pain Pain Assessment: No/denies pain    Home Living  Prior Function            PT Goals (current goals can now be found in the care plan section) Acute Rehab PT Goals Patient Stated Goal: to go home PT Goal Formulation: With patient Time For Goal Achievement: 09/10/21 Potential to Achieve Goals: Good Progress towards PT goals: Progressing toward goals    Frequency    Min 2X/week      PT Plan Current plan remains appropriate     Co-evaluation              AM-PAC PT "6 Clicks" Mobility   Outcome Measure  Help needed turning from your back to your side while in a flat bed without using bedrails?: None Help needed moving from lying on your back to sitting on the side of a flat bed without using bedrails?: None Help needed moving to and from a bed to a chair (including a wheelchair)?: None Help needed standing up from a chair using your arms (e.g., wheelchair or bedside chair)?: None Help needed to walk in hospital room?: None Help needed climbing 3-5 steps with a railing? : None 6 Click Score: 24    End of Session Equipment Utilized During Treatment: Gait belt Activity Tolerance: Patient tolerated treatment well Patient left: in bed Nurse Communication: Mobility status PT Visit Diagnosis: Other abnormalities of gait and mobility (R26.89);Muscle weakness (generalized) (M62.81)     Time: 7014-1030 PT Time Calculation (min) (ACUTE ONLY): 14 min  Charges:  $Gait Training: 8-22 mins                    Saman Giddens H. Owens Shark, PT, DPT, NCS 08/30/21, 3:36 PM [phone]

## 2021-08-30 NOTE — Progress Notes (Addendum)
Patient ID: Terrence Martin, male   DOB: 07-Nov-1966, 55 y.o.   MRN: [id number] [facility] PROGRESS NOTE  Terrence Martin MOQ:[id number] DOB: 07-Oct-1966 DOA: 08/23/2021 PCP: Pcp, No  HPI/Subjective: Patient feels okay.  Feeling stronger today.  Stated had 3 bowel movements yesterday and they were brown.  No blood.  No nausea or vomiting.  Tolerating diet.  Admitted initially with acute hepatic encephalopathy and esophageal variceal bleed  Objective: Vitals:   08/30/21 1512 08/30/21 1724  BP: 121/69 125/77  Pulse: 85 85  Resp: 20 20  Temp: 98.9 F (37.2 C) 98.4 F (36.9 C)  SpO2: 100% 100%    Intake/Output Summary (Last 24 hours) at 08/30/2021 1823 Last data filed at 08/30/2021 1720 Gross per 24 hour  Intake 570 ml  Output --  Net 570 ml   Filed Weights   08/23/21 1954 08/24/21 1635  Weight: 104.3 kg 104.3 kg    ROS: Review of Systems  Respiratory:  Negative for shortness of breath.   Cardiovascular:  Negative for chest pain.  Gastrointestinal:  Negative for abdominal pain, nausea and vomiting.  Exam: Physical Exam HENT:     Head: Normocephalic.     Mouth/Throat:     Pharynx: No oropharyngeal exudate.  Eyes:     General: Lids are normal.     Conjunctiva/sclera: Conjunctivae normal.  Cardiovascular:     Rate and Rhythm: Normal rate and regular rhythm.     Heart sounds: Normal heart sounds, S1 normal and S2 normal.  Pulmonary:     Breath sounds: Normal breath sounds. No decreased breath sounds, wheezing, rhonchi or rales.  Abdominal:     Palpations: Abdomen is soft.     Tenderness: There is no abdominal tenderness.  Musculoskeletal:     Right lower leg: No swelling.     Left lower leg: No swelling.  Skin:    General: Skin is warm.     Findings: No rash.  Neurological:     Mental Status: He is alert and oriented to person, place, and time.      Scheduled Meds:  sodium chloride   Intravenous Once   Chlorhexidine Gluconate Cloth  6 each Topical Daily   folic  acid  1 mg Intravenous Daily   lactulose  30 g Oral BID   nadolol  10 mg Oral QHS   pantoprazole  40 mg Oral BID AC   rifaximin  550 mg Oral BID   thiamine  100 mg Oral Daily   Continuous Infusions:  heparin 500 Units/hr (08/30/21 0645)   iron sucrose 400 mg (08/30/21 1725)   ondansetron (ZOFRAN) IV      Assessment/Plan:  Left arm brachial DVT from prior IV site.  Patient on low-dose heparin drip.  We will reimage left arm with an ultrasound tomorrow morning.  Then will decide on whether to prescribe Eliquis. Acute hepatic encephalopathy.  Mental status much improved on lactulose and Xifaxan. Esophageal variceal bleed and portal hypertensive gastropathy.  With hemoglobin 7.5 today I will give 1 unit of packed red blood cells.  I will also give IV iron.  Patient had esophageal variceal bleeding on 08/24/2021.  On Protonix and soft diet. Alcoholic liver cirrhosis with thrombocytopenia.  Patient completed high-dose thiamine while here in the hospital.  Patient needs to be alcohol free for 6 months in order to be a candidate for liver transplant.  Last platelet count 165,000. Hypomagnesemia on presentation and hyperkalemia on presentation Weakness.  Did not better with physical therapy today and  change the recommendations to going home.      Code Status:     Code Status Orders  (From admission, onward)           Start     Ordered   08/24/21 0000  Full code  Continuous        08/23/21 2359           Code Status History     Date Active Date Inactive Code Status Order ID Comments User Context   02/17/2021 0256 02/19/2021 2140 Full Code [id number]  Toy Baker, MD Inpatient      Family Communication: Spoke with girlfriend on the phone Disposition Plan: Status is: Inpatient  Dispo: The patient is from: Home              Anticipated d/c is to: Home              Patient currently on low-dose heparin drip for left brachial DVT.  Since the patient came in with  esophageal variceal bleed I am concerned about anticoagulation.  So far no bleeding.  Will reimage his arm tomorrow and decide if we can start Eliquis or not.   Difficult to place patient.  No.  Consultants: Gastroenterology  Procedures: EGD with variceal banding  Antibiotics: Completed antibiotics  Time spent: 27 minutes  [place]

## 2021-08-31 ENCOUNTER — Inpatient Hospital Stay: Payer: BC Managed Care – PPO

## 2021-08-31 LAB — CBC
HCT: 26.9 % — ABNORMAL LOW (ref 39.0–52.0)
Hemoglobin: 9 g/dL — ABNORMAL LOW (ref 13.0–17.0)
MCH: 29.2 pg (ref 26.0–34.0)
MCHC: 33.5 g/dL (ref 30.0–36.0)
MCV: 87.3 fL (ref 80.0–100.0)
Platelets: 179 10*3/uL (ref 150–400)
RBC: 3.08 MIL/uL — ABNORMAL LOW (ref 4.22–5.81)
RDW: 17.3 % — ABNORMAL HIGH (ref 11.5–15.5)
WBC: 8.5 10*3/uL (ref 4.0–10.5)
nRBC: 0.4 % — ABNORMAL HIGH (ref 0.0–0.2)

## 2021-08-31 LAB — COMPREHENSIVE METABOLIC PANEL
ALT: 29 U/L (ref 0–44)
AST: 59 U/L — ABNORMAL HIGH (ref 15–41)
Albumin: 2.3 g/dL — ABNORMAL LOW (ref 3.5–5.0)
Alkaline Phosphatase: 79 U/L (ref 38–126)
Anion gap: 5 (ref 5–15)
BUN: 12 mg/dL (ref 6–20)
CO2: 21 mmol/L — ABNORMAL LOW (ref 22–32)
Calcium: 7.6 mg/dL — ABNORMAL LOW (ref 8.9–10.3)
Chloride: 108 mmol/L (ref 98–111)
Creatinine, Ser: 0.93 mg/dL (ref 0.61–1.24)
GFR, Estimated: >60 mL/min (ref >60)
Glucose, Bld: 99 mg/dL (ref 70–99)
Potassium: 3 mmol/L — ABNORMAL LOW (ref 3.5–5.1)
Sodium: 134 mmol/L — ABNORMAL LOW (ref 135–145)
Total Bilirubin: 2.4 mg/dL — ABNORMAL HIGH (ref 0.3–1.2)
Total Protein: 6.3 g/dL — ABNORMAL LOW (ref 6.5–8.1)

## 2021-08-31 LAB — BPAM RBC
Blood Product Expiration Date: 202211032359
ISSUE DATE / TIME: 202210031452
Unit Type and Rh: 5100

## 2021-08-31 LAB — HEPARIN LEVEL (UNFRACTIONATED): Heparin Unfractionated: <0.1 IU/mL — ABNORMAL LOW (ref 0.30–0.70)

## 2021-08-31 LAB — TYPE AND SCREEN
ABO/RH(D): O POS
Antibody Screen: NEGATIVE
Unit division: 0

## 2021-08-31 MED ORDER — POTASSIUM CHLORIDE CRYS ER 20 MEQ PO TBCR
40.0000 meq | EXTENDED_RELEASE_TABLET | Freq: Two times a day (BID) | ORAL | Status: AC
  Administered 2021-08-31 (×2): 40 meq via ORAL
  Filled 2021-08-31 (×2): qty 2

## 2021-08-31 MED ORDER — APIXABAN 5 MG PO TABS
5.0000 mg | ORAL_TABLET | Freq: Two times a day (BID) | ORAL | Status: DC
  Administered 2021-08-31 – 2021-09-01 (×3): 5 mg via ORAL
  Filled 2021-08-31 (×3): qty 1

## 2021-08-31 NOTE — Consult Note (Signed)
ANTICOAGULATION CONSULT NOTE -  Consult  Pharmacy Consult for Heparin gtt > apixaban Indication: DVT (left basilic and brachial veins)   No Known Allergies  Patient Measurements: Height: 5\' 10"  (177.8 cm) Weight: 104.3 kg (230 lb) IBW/kg (Calculated) : 73 Heparin Dosing Weight: 95.2kg  Vital Signs: Temp: 98.9 F (37.2 C) (10/04 0724) Temp Source: Oral (10/04 0724) BP: 129/54 (10/04 0724) Pulse Rate: 82 (10/04 0724)  Labs: Recent Labs    08/28/21 1232 08/28/21 2006 08/29/21 0543 08/29/21 1352 08/30/21 0616 08/31/21 0528  HGB  --   --  7.8*   < > 7.5* 9.0*  HCT  --   --  23.4*  --  22.3* 26.9*  PLT  --   --  143*  --  165 179  APTT 39*  --   --   --   --   --   HEPARINUNFRC  --    < > <0.10*  --  <0.10* <0.10*  CREATININE  --   --  0.93  --   --  0.93   < > = values in this interval not displayed.     Estimated Creatinine Clearance: 106 mL/min (by C-G formula based on SCr of 0.93 mg/dL).   Medications: NKDA PTA: no AC/APT  Inpatient: hep gtt (500 un/hr flat rate) Heparin Dosing Weight: 95.2kg  Assessment: 55yo M w/ AMS 2/2 acute hepatic encephalopathy, Esophageal variceal bleed and portal hypertensive gastropathy c/b ABLA. Pharmacy consulted to start apixaban for UE DVT.   Goal of Therapy:  Monitor platelets by anticoagulation protocol: Yes   Plan:  Despite upper extremity DVT, pt has Acute/active variceal bleed. Will start apixaban 5 mg BID. Will not give 10 mg BID x 7 days due to variceal bleeding risk. Hgb remains stable after transfuses. Will stop heparin gtt.    Oswald Hillock, PharmD Clinical Pharmacist 08/31/2021,12:10 PM

## 2021-08-31 NOTE — Progress Notes (Signed)
Physical Therapy Treatment Patient Details Name: Terrence Martin MRN: [id number] DOB: 09/21/1966 Today's Date: 08/31/2021   History of Present Illness Pt is a 55 y.o. M arrving to ED due to vomiting blood, admitted for upper GI bleed. PMH includes liver cirrhosis, anemia, peptic ulcer disease, and HTN. Found to have DVT in the L brachial vein on 9/30 (now s/p low-dose heparin drip for x2 days; okay to mobilize per MD)    PT Comments    Patient continues to demonstrate improvement in mobility, gait efforts; has been ambulating within room environment indep without difficulty. Does demonstrate mild higher-level balance deficits to R UE, but demonstrates indep use of compensatory strategies and safety strategies to address. Comfortable with current performance and voices understanding of all information; no further questions at this time. Has met all goals for acute rehab services; will discharge formal PT at this time.   Do encourage continued mobilization in room/around unit with staff as appropraite throughout remaining hospital stay.  Patient voices understanding and agreement.    Recommendations for follow up therapy are one component of a multi-disciplinary discharge planning process, led by the attending physician.  Recommendations may be updated based on patient status, additional functional criteria and insurance authorization.  Follow Up Recommendations  No PT follow up     Equipment Recommendations       Recommendations for Other Services       Precautions / Restrictions Precautions Precautions: None     Mobility  Bed Mobility Overal bed mobility: Independent                  Transfers Overall transfer level: Independent Equipment used: None             General transfer comment: good LE strength/power; no assist device, physical assist from therapist required  Ambulation/Gait Ambulation/Gait assistance: Independent Gait Distance (Feet): 400  Feet Assistive device: None       General Gait Details: reciprocal stepping pattern, fair/good trunk rotation, arm swing and LE mechanics.  Steady without buckling, LOB or safety concern; feels gait performance is near baseline for him   Stairs Stairs: Yes Stairs assistance: Supervision Stair Management: One rail Right Number of Stairs: 6 General stair comments: self-selecting step to gait pattern when descending; step through gait pattern when ascending.  Does require UE support in periods of R SLS   Wheelchair Mobility    Modified Rankin (Stroke Patients Only)       Balance Overall balance assessment: Modified Independent               Single Leg Stance - Right Leg: 6 Single Leg Stance - Left Leg: 10                        Cognition Arousal/Alertness: Awake/alert Behavior During Therapy: WFL for tasks assessed/performed Overall Cognitive Status: Within Functional Limits for tasks assessed                                        Exercises Other Exercises Other Exercises: Reviewed car transfer, safety with R LE SLS (esp with work activities), activity progression/activity pacing upon discharge. Patient voiced understanding of all information.    General Comments        Pertinent Vitals/Pain Pain Assessment: No/denies pain    Home Living  Prior Function            PT Goals (current goals can now be found in the care plan section) Acute Rehab PT Goals Patient Stated Goal: to go home PT Goal Formulation: All assessment and education complete, DC therapy Progress towards PT goals: Goals met/education completed, patient discharged from PT    Frequency           PT Plan Discharge plan needs to be updated    Co-evaluation              AM-PAC PT "6 Clicks" Mobility   Outcome Measure  Help needed turning from your back to your side while in a flat bed without using bedrails?: None Help  needed moving from lying on your back to sitting on the side of a flat bed without using bedrails?: None Help needed moving to and from a bed to a chair (including a wheelchair)?: None Help needed standing up from a chair using your arms (e.g., wheelchair or bedside chair)?: None Help needed to walk in hospital room?: None Help needed climbing 3-5 steps with a railing? : None 6 Click Score: 24    End of Session Equipment Utilized During Treatment: Gait belt Activity Tolerance: Patient tolerated treatment well Patient left: in bed   PT Visit Diagnosis: Other abnormalities of gait and mobility (R26.89);Muscle weakness (generalized) (M62.81)     Time: 1500-1510 PT Time Calculation (min) (ACUTE ONLY): 10 min  Charges:  $Gait Training: 8-22 mins                    Jalessa Peyser H. Owens Shark, PT, DPT, NCS 08/31/21, 3:21 PM [phone]

## 2021-08-31 NOTE — TOC Progression Note (Signed)
Transition of Care [facility]) - Progression Note    Patient Details  Name: Terrence Martin MRN: [id number] Date of Birth: 18-Aug-1966  Transition of Care [facility]) CM/SW Contact  Beverly Sessions, RN Phone Number: 08/31/2021, 3:25 PM  Clinical Narrative:        Patient progressed with PT.  No OT follow up recommended.  No DME needs at discharge.  Patient agreeable with this plan.  Significant other was at bedside when disposition was discussed.   Anticipated that patient will discharge on eliquis.  Eliquis 30 day placed on chart     Expected Discharge Plan and Services                                                 Social Determinants of Health (SDOH) Interventions    Readmission Risk Interventions No flowsheet data found.

## 2021-08-31 NOTE — Progress Notes (Signed)
Patient ID: Terrence Martin, male   DOB: 04-15-1966, 55 y.o.   MRN: [id number] [facility] PROGRESS NOTE  Terrence Martin KKX:[id number] DOB: 01-02-1966 DOA: 08/23/2021 PCP: Pcp, No  HPI/Subjective: Patient feels okay.  Offers no complaints.  Stronger while here in the hospital.  His bowel movements are brown at this point.  No signs of bleeding.  Initially admitted with acute hepatic encephalopathy and esophageal variceal bleed.  Objective: Vitals:   08/31/21 0333 08/31/21 0724  BP: 124/71 (!) 129/54  Pulse: 84 82  Resp: 20 18  Temp: 98.5 F (36.9 C) 98.9 F (37.2 C)  SpO2: 100% 100%    Intake/Output Summary (Last 24 hours) at 08/31/2021 1552 Last data filed at 08/31/2021 1300 Gross per 24 hour  Intake 988 ml  Output --  Net 988 ml   Filed Weights   08/23/21 1954 08/24/21 1635  Weight: 104.3 kg 104.3 kg    ROS: Review of Systems  Respiratory:  Negative for shortness of breath.   Cardiovascular:  Negative for chest pain.  Gastrointestinal:  Negative for abdominal pain, nausea and vomiting.  Exam: Physical Exam HENT:     Head: Normocephalic.     Mouth/Throat:     Pharynx: No oropharyngeal exudate.  Eyes:     General: Lids are normal.     Conjunctiva/sclera: Conjunctivae normal.  Cardiovascular:     Rate and Rhythm: Normal rate and regular rhythm.     Heart sounds: Normal heart sounds, S1 normal and S2 normal.  Pulmonary:     Breath sounds: No decreased breath sounds, wheezing, rhonchi or rales.  Abdominal:     Palpations: Abdomen is soft.     Tenderness: There is no abdominal tenderness.  Musculoskeletal:     Right lower leg: No swelling.     Left lower leg: No swelling.  Skin:    General: Skin is warm.     Findings: No rash.  Neurological:     Mental Status: He is alert and oriented to person, place, and time.      Scheduled Meds:  sodium chloride   Intravenous Once   apixaban  5 mg Oral BID   folic acid  1 mg Intravenous Daily   lactulose  30 g Oral BID    nadolol  10 mg Oral QHS   pantoprazole  40 mg Oral BID AC   potassium chloride  40 mEq Oral BID   rifaximin  550 mg Oral BID   thiamine  100 mg Oral Daily   Continuous Infusions:  ondansetron (ZOFRAN) IV     Brief history 55 year old man with prior history of GI bleed and hypertension presents to the hospital with acute metabolic encephalopathy and GI bleed.  The patient had banding of esophageal varices on 08/24/2021.  Patient's mental status was impaired for quite a few days and started on right from and lactulose.  Patient developed acute pain in his left arm and was diagnosed with a left brachial DVT.  Case discussed with gastroenterology and vascular surgery and we would normally anticoagulate left brachial vein DVT but hesitant with recent esophageal variceal bleed.  The patient was interested in anticoagulation.  We started on low-dose heparin drip for a few days and no bleeding.  Repeat sonogram shows shows blood clot so Eliquis was started on 08/31/2021.  We will watch closely for any signs of bleeding.  Assessment/Plan:  Left arm brachial DVT likely from prior IV site.  Repeat sonogram of the left arm chills shows blood clot.  Patient was on low-dose heparin drip without bleeding.  Will switch over to low-dose Eliquis and watch for signs of bleeding.  If no signs of bleeding in the next day or so may be able to be discharged. Acute hepatic encephalopathy.  Mental status much improved on lactulose and Xifaxan.  Goal 3 bowel movements a day. Esophageal variceal bleed and portal hypertensive gastropathy.  During the hospital course received 2 units of packed red blood cells and IV iron.  Patient had esophageal variceal banding on 08/24/2021 by Dr. Marius Ditch.  Patient is on Protonix and tolerating diet. Alcoholic liver cirrhosis with thrombocytopenia.  Patient received high-dose thiamine here in the hospital and now on low-dose thiamine.  Is a candidate for liver transplant if he is alcohol free  for 6 months.  Last platelet count 179,000. Hypomagnesemia on presentation hyperkalemia on presentation have improved Weakness.  Initially physical therapy recommended CIR but patient got stronger while here and now recommending home.    Code Status:     Code Status Orders  (From admission, onward)           Start     Ordered   08/24/21 0000  Full code  Continuous        08/23/21 2359           Code Status History     Date Active Date Inactive Code Status Order ID Comments User Context   02/17/2021 0256 02/19/2021 2140 Full Code [id number]  Toy Baker, MD Inpatient      Family Communication: Spoke with wife on the phone Disposition Plan: Status is: Inpatient  Dispo: The patient is from: Home              Anticipated d/c is to: Home in the next 24 to 48 hours depending if no bleeding              Patient currently started on Eliquis.  Hesitant on anticoagulation with recent variceal GI bleed   Difficult to place patient.  No.  Consultants: Gastroenterology  Procedures: Esophageal variceal banding  Antibiotics: Completed antibiotics  Time spent: 27 minutes  [place]

## 2021-08-31 NOTE — Anesthesia Postprocedure Evaluation (Signed)
Anesthesia Post Note  Patient: Terrence Martin  Procedure(s) Performed: ESOPHAGOGASTRODUODENOSCOPY (EGD) WITH PROPOFOL  Patient location during evaluation: PACU Anesthesia Type: General Level of consciousness: awake and alert Pain management: pain level controlled Vital Signs Assessment: post-procedure vital signs reviewed and stable Respiratory status: spontaneous breathing, nonlabored ventilation, respiratory function stable and patient connected to nasal cannula oxygen Cardiovascular status: blood pressure returned to baseline and stable Postop Assessment: no apparent nausea or vomiting Anesthetic complications: no   No notable events documented.   Last Vitals:  Vitals:   08/31/21 0724 08/31/21 1604  BP: (!) 129/54 122/72  Pulse: 82 92  Resp: 18 18  Temp: 37.2 C 37.3 C  SpO2: 100% 100%    Last Pain:  Vitals:   08/31/21 1604  TempSrc: Oral  PainSc:                  Molli Barrows

## 2021-08-31 NOTE — Progress Notes (Signed)
OT Cancellation Note  Patient Details Name: Terrence Martin MRN: [id number] DOB: 1966/02/25   Cancelled Treatment:    Reason Eval/Treat Not Completed: OT screened, no needs identified, will sign off. Pt attempted x2 this date, AM pt at ultrasound and PM pt reports defers 2/2 recently completed PT. Per conversation with PT and pt, no further skilled acute OT needs identified. All DME needs met and education provided. Will sign off at this time.   Dessie Coma, M.S. OTR/L  08/31/21, 3:48 PM  ascom [phone]

## 2021-09-01 LAB — CBC
HCT: 24.5 % — ABNORMAL LOW (ref 39.0–52.0)
Hemoglobin: 8.2 g/dL — ABNORMAL LOW (ref 13.0–17.0)
MCH: 29.3 pg (ref 26.0–34.0)
MCHC: 33.5 g/dL (ref 30.0–36.0)
MCV: 87.5 fL (ref 80.0–100.0)
Platelets: 185 10*3/uL (ref 150–400)
RBC: 2.8 MIL/uL — ABNORMAL LOW (ref 4.22–5.81)
RDW: 17.6 % — ABNORMAL HIGH (ref 11.5–15.5)
WBC: 7.9 10*3/uL (ref 4.0–10.5)
nRBC: 0 % (ref 0.0–0.2)

## 2021-09-01 LAB — BASIC METABOLIC PANEL
Anion gap: 3 — ABNORMAL LOW (ref 5–15)
BUN: 10 mg/dL (ref 6–20)
CO2: 21 mmol/L — ABNORMAL LOW (ref 22–32)
Calcium: 7.7 mg/dL — ABNORMAL LOW (ref 8.9–10.3)
Chloride: 111 mmol/L (ref 98–111)
Creatinine, Ser: 0.75 mg/dL (ref 0.61–1.24)
GFR, Estimated: >60 mL/min (ref >60)
Glucose, Bld: 100 mg/dL — ABNORMAL HIGH (ref 70–99)
Potassium: 3.4 mmol/L — ABNORMAL LOW (ref 3.5–5.1)
Sodium: 135 mmol/L (ref 135–145)

## 2021-09-01 LAB — MAGNESIUM: Magnesium: 1.9 mg/dL (ref 1.7–2.4)

## 2021-09-01 MED ORDER — POTASSIUM CHLORIDE CRYS ER 20 MEQ PO TBCR
40.0000 meq | EXTENDED_RELEASE_TABLET | ORAL | Status: DC
Start: 2021-09-01 — End: 2021-09-01
  Administered 2021-09-01: 40 meq via ORAL
  Filled 2021-09-01: qty 2

## 2021-09-01 MED ORDER — APIXABAN 5 MG PO TABS: 5.0000 mg | ORAL_TABLET | Freq: Two times a day (BID) | ORAL | 0 refills | Status: AC

## 2021-09-01 MED ORDER — PANTOPRAZOLE SODIUM 40 MG PO TBEC: 40.0000 mg | DELAYED_RELEASE_TABLET | Freq: Two times a day (BID) | ORAL | 0 refills | Status: AC

## 2021-09-01 MED ORDER — RIFAXIMIN 550 MG PO TABS
550.0000 mg | ORAL_TABLET | Freq: Two times a day (BID) | ORAL | 0 refills | Status: DC
Start: 2021-09-01 — End: 2021-09-30

## 2021-09-01 NOTE — Discharge Summary (Signed)
Physician Discharge Summary  Patient ID: Terrence Martin MRN: [id number] DOB/AGE: 02/18/1966 55 y.o.  Admit date: 08/23/2021 Discharge date: 09/01/2021  Admission Diagnoses:  Discharge Diagnoses:  Principal Problem:   Secondary esophageal varices with bleeding ([place]) Active Problems:   Alcohol abuse   Acute blood loss anemia   Alcoholic cirrhosis of liver without ascites ([facility])   Hyperkalemia   Hypomagnesemia   Thrombocytopenia ([facility])   Acute hepatic encephalopathy   Acute deep vein thrombosis (DVT) of brachial vein of left upper extremity ([place])   Weakness   Discharged Condition: good  Hospital Course:  55 year old man with prior history of GI bleed and hypertension presents to the hospital with acute metabolic encephalopathy and GI bleed.  The patient had banding of esophageal varices on 08/24/2021.  Patient's mental status was impaired for quite a few days and improved after giving lactulose.  Patient developed acute pain in his left arm and was diagnosed with a left brachial DVT.  Case discussed with gastroenterology and vascular surgery and we would normally anticoagulate left brachial vein DVT but hesitant with recent esophageal variceal bleed.  The patient was interested in anticoagulation.  We started on low-dose heparin drip for a few days and no bleeding.  Repeat sonogram shows shows blood clot so Eliquis was started on 08/31/2021.   #1.  Left arm bronchial DVT. Continue Eliquis at 5 mg twice a day.  Patient be followed by PCP as outpatient.  Advised to discontinue anticoagulation if there is any black stool or rectal bleeding.  He need to come back to the hospital if you develop GI bleed again.  2.  Acute hepatic encephalopathy. Acute blood loss anemia. Esophageal varices with bleed secondary and portal hypertensive gastropathy. Alcoholic liver cirrhosis. Thrombocytopenia secondary to liver cirrhosis. Patient condition has improved, patient has been treated for a few days with  anticoagulation, no additional bleeding.  Hemoglobin has been stable. Patient is medically stable to be discharged.  Patient advised to follow-up with PCP and GI as outpatient.  We will continue PPI.  Hypomagnesemia. General weakness.  Consults: GI  Significant Diagnostic Studies:  EGD Normal duodenal bulb and second portion of the duodenum. - Portal hypertensive gastropathy. - Large (> 5 mm) esophageal varices with stigmata of recent bleeding. Incompletely eradicated. Banded. - No specimens collected. Impression: - Return patient to hospital ward for ongoing care. - Clear liquid diet today. - Continue present medications. - Repeat upper endoscopy in 2 months for retreatment and for endoscopic band ligation.    Treatments: Protonix  Discharge Exam: Blood pressure (!) 100/57, pulse 85, temperature 99.5 F (37.5 C), temperature source Oral, resp. rate 18, height 5\' 10"  (1.778 m), weight 104.3 kg, SpO2 100 %. General appearance: alert and cooperative Resp: clear to auscultation bilaterally Cardio: regular rate and rhythm, S1, S2 normal, no murmur, click, rub or gallop GI: soft, non-tender; bowel sounds normal; no masses,  no organomegaly Extremities: extremities normal, atraumatic, no cyanosis or edema  Disposition: Discharge disposition: 01-Home or Self Care       Discharge Instructions     Diet - low sodium heart healthy   Complete by: As directed    Fluid restriction 1557ml/day   Increase activity slowly   Complete by: As directed       Allergies as of 09/01/2021   No Known Allergies      Medication List     TAKE these medications    apixaban 5 MG Tabs tablet Commonly known as: ELIQUIS Take 1 tablet (5  mg total) by mouth 2 (two) times daily.   furosemide 20 MG tablet Commonly known as: LASIX Take 3 tablets (60 mg total) by mouth daily.   nadolol 20 MG tablet Commonly known as: Corgard Take 1 tablet (20 mg total) by mouth daily.   pantoprazole 40  MG tablet Commonly known as: PROTONIX Take 1 tablet (40 mg total) by mouth 2 (two) times daily before a meal.   rifaximin 550 MG Tabs tablet Commonly known as: XIFAXAN Take 1 tablet (550 mg total) by mouth 2 (two) times daily.   spironolactone 50 MG tablet Commonly known as: ALDACTONE Take 3 tablets (150 mg total) by mouth daily.   SV Vitamin B-12 ER 1000 MCG Tbcr Generic drug: Cyanocobalamin Take 1 tablet by mouth daily.   Vitamin D (Ergocalciferol) 1.25 MG (50000 UNIT) Caps capsule Commonly known as: DRISDOL Take 1 capsule (50,000 Units total) by mouth every 7 (seven) days.        Follow-up Information     Lin Landsman, MD Follow up in 2 week(s).   Specialty: Gastroenterology Contact information: [place] [address] [phone]         Kate Sable, MD Follow up in 1 week(s).   Specialties: Cardiology, Radiology Contact information: [place] [address] [phone]                32 minutes Signed: Sharen Hones 09/01/2021, 10:26 AM

## 2021-09-01 NOTE — Progress Notes (Signed)
AVS reviewed with pt and pt verbalized understanding of all instructions. NAD noted or voiced concerns at this time.

## 2021-09-01 NOTE — Plan of Care (Signed)
  Problem: Education: Goal: Knowledge of General Education information will improve Description: Including pain rating scale, medication(s)/side effects and non-pharmacologic comfort measures Outcome: Progressing   Problem: Health Behavior/Discharge Planning: Goal: Ability to manage health-related needs will improve Outcome: Progressing   Problem: Clinical Measurements: Goal: Ability to maintain clinical measurements within normal limits will improve Outcome: Progressing Goal: Will remain free from infection Outcome: Progressing Goal: Diagnostic test results will improve Outcome: Progressing Goal: Respiratory complications will improve Outcome: Progressing Goal: Cardiovascular complication will be avoided Outcome: Progressing   Problem: Nutrition: Goal: Adequate nutrition will be maintained Outcome: Progressing   Problem: Elimination: Goal: Will not experience complications related to bowel motility Outcome: Progressing Goal: Will not experience complications related to urinary retention Outcome: Progressing   Problem: Safety: Goal: Ability to remain free from injury will improve Outcome: Progressing

## 2021-09-01 NOTE — Plan of Care (Signed)
  Problem: Education: Goal: Knowledge of General Education information will improve Description: Including pain rating scale, medication(s)/side effects and non-pharmacologic comfort measures Outcome: Adequate for Discharge   Problem: Health Behavior/Discharge Planning: Goal: Ability to manage health-related needs will improve Outcome: Adequate for Discharge   Problem: Clinical Measurements: Goal: Ability to maintain clinical measurements within normal limits will improve Outcome: Adequate for Discharge Goal: Will remain free from infection Outcome: Adequate for Discharge Goal: Diagnostic test results will improve Outcome: Adequate for Discharge Goal: Respiratory complications will improve Outcome: Adequate for Discharge Goal: Cardiovascular complication will be avoided Outcome: Adequate for Discharge   Problem: Nutrition: Goal: Adequate nutrition will be maintained Outcome: Adequate for Discharge   Problem: Elimination: Goal: Will not experience complications related to bowel motility Outcome: Adequate for Discharge Goal: Will not experience complications related to urinary retention Outcome: Adequate for Discharge   Problem: Safety: Goal: Ability to remain free from injury will improve Outcome: Adequate for Discharge

## 2021-09-13 NOTE — Progress Notes (Signed)
Cardiology Office Note:    Date:  09/15/2021   ID:  [name], DOB [birth year], MRN [id number]  PCP:  Pcp, No  [facility] Cardiologist:  Kate Sable, MD  [place] Electrophysiologist:  None   Referring MD: No ref. provider found   Chief Complaint: Hospital follow-up  History of Present Illness:    Terrence Martin is a 55 y.o. male with a hx of  alcohol abuse, cirrhosis GI bleed s/p gastric banding, who presents for follow-up.   Patient initially seen in the [facility] on 02/17/2021 after being admitted for blood in stool.  Hemoglobin in the hospital was as low as 6.6.  Underwent EGD with esophageal varices noted s/p gastric banding.  Ultrasound of the liver obtained 01/2021 showed evidence of cirrhosis with progression since prior in 2021.   Troponins were noted to be elevated, more consistent with demand supply mismatch.  Echocardiogram obtained on 02/17/2021 showed normal systolic function, EF 60 to 65%.  Troponins obtained 6 days ago was normal.   Last seen 03/08/21 and had not had a drink in 3 weeks. For LLE patient was started on lasix 37m daily.   Admitted 9/26-10/5 for actue left arm DVT, encephalography, hypomagnesemia. Started on Eliquis 546mBID.   Today, the patient reports he has been doing well since discharge. Was feeling very weak. Patient is not back working yet. He has 2 more appointments. No bleeding issues on aspirin. Left arm feels normal. No chest pain or shortness of breath. No LLE, improved with lasix. No orthopnea or pnd. Patient has not drank in 3 weeks. He is also taking spironolactone. Recent labs reviewed. Recently met daughter after 40 years. Was previously given up for adoption.   Past Medical History:  Diagnosis Date   GIB (gastrointestinal bleeding)    Hypertension    IDA (iron deficiency anemia) 08/24/2020   PUD (peptic ulcer disease)     Past Surgical History:  Procedure Laterality Date   COLONOSCOPY WITH PROPOFOL N/A 04/01/2021    Procedure: COLONOSCOPY WITH PROPOFOL;  Surgeon: VaLin LandsmanMD;  [facility]  Service: Gastroenterology;  Laterality: N/A;   ESOPHAGOGASTRODUODENOSCOPY (EGD) WITH PROPOFOL N/A 02/17/2021   Procedure: ESOPHAGOGASTRODUODENOSCOPY (EGD) WITH PROPOFOL;  Surgeon: VaLin LandsmanMD;  [facility];  Service: Gastroenterology;  Laterality: N/A;   ESOPHAGOGASTRODUODENOSCOPY (EGD) WITH PROPOFOL N/A 04/01/2021   Procedure: ESOPHAGOGASTRODUODENOSCOPY (EGD) WITH PROPOFOL;  Surgeon: VaLin LandsmanMD;  [facility];  Service: Gastroenterology;  Laterality: N/A;   ESOPHAGOGASTRODUODENOSCOPY (EGD) WITH PROPOFOL N/A 08/24/2021   Procedure: ESOPHAGOGASTRODUODENOSCOPY (EGD) WITH PROPOFOL;  Surgeon: VaLin LandsmanMD;  [facility];  Service: Gastroenterology;  Laterality: N/A;    Current Medications: Current Meds  Medication Sig   apixaban (ELIQUIS) 5 MG TABS tablet Take 1 tablet (5 mg total) by mouth 2 (two) times daily.   pantoprazole (PROTONIX) 40 MG tablet Take 1 tablet (40 mg total) by mouth 2 (two) times daily before a meal.   SV VITAMIN B-12 ER 1000 MCG TBCR Take 1 tablet by mouth daily.     Allergies:   Patient has no known allergies.   Social History   Socioeconomic History   Marital status: Single    Spouse name: Not on file   Number of children: Not on file   Years of education: Not on file   Highest education level: Not on file  Occupational History   Not on file  Tobacco Use   Smoking status: Never   Smokeless  tobacco: Never  Vaping Use   Vaping Use: Never used  Substance and Sexual Activity   Alcohol use: Not Currently    Alcohol/week: 72.0 standard drinks    Types: 72 Cans of beer per week   Drug use: Never   Sexual activity: Not on file  Other Topics Concern   Not on file  Social History Narrative   Lives locally.  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  Does not routinely exercise.   Social Determinants of Health    Financial Resource Strain: Not on file  Food Insecurity: Not on file  Transportation Needs: Not on file  Physical Activity: Not on file  Stress: Not on file  Social Connections: Not on file     Family History: The patient's family history includes Cancer in his father; Heart attack in his brother; Lung cancer in his mother.  ROS:   Please see the history of present illness.     All other systems reviewed and are negative.  EKGs/Labs/Other Studies Reviewed:    The following studies were reviewed today:  Echo 02/17/21   1. Left ventricular ejection fraction, by estimation, is 60 to 65%. The  left ventricle has normal function. The left ventricle has no regional  wall motion abnormalities. Left ventricular diastolic parameters are  consistent with Grade I diastolic  dysfunction (impaired relaxation).   2. Right ventricular systolic function is normal. The right ventricular  size is normal. Tricuspid regurgitation signal is inadequate for assessing  PA pressure.   3. Left atrial size was moderately dilated.   EKG:  EKG is  ordered today.  The ekg ordered today demonstrates NSR, 83bpm, no ischemic changes  Recent Labs: 03/02/2021: B Natriuretic Peptide 60.9 08/25/2021: TSH 0.687 08/31/2021: ALT 29 09/01/2021: BUN 10; Creatinine, Ser 0.75; Hemoglobin 8.2; Magnesium 1.9; Platelets 185; Potassium 3.4; Sodium 135  Recent Lipid Panel    Component Value Date/Time   CHOL 120 02/19/2021 0524   TRIG 106 02/19/2021 0524   HDL 17 (L) 02/19/2021 0524   CHOLHDL 7.1 02/19/2021 0524   VLDL 21 02/19/2021 0524   LDLCALC 82 02/19/2021 0524    Physical Exam:    VS:  BP 130/80 (BP Location: Right Arm, Patient Position: Sitting, Cuff Size: Normal)   Pulse 83   Ht 5' 10"  (1.778 m)   Wt 232 lb (105.2 kg)   SpO2 99%   BMI 33.29 kg/m     Wt Readings from Last 3 Encounters:  09/15/21 232 lb (105.2 kg)  08/24/21 230 lb (104.3 kg)  04/01/21 230 lb (104.3 kg)     GEN:  Well nourished,  well developed in no acute distress HEENT: Normal NECK: No JVD; No carotid bruits LYMPHATICS: No lymphadenopathy CARDIAC: RRR, no murmurs, rubs, gallops RESPIRATORY:  Clear to auscultation without rales, wheezing or rhonchi  ABDOMEN: Soft, non-tender, non-distended MUSCULOSKELETAL:  No edema; No deformity  SKIN: Warm and dry NEUROLOGIC:  Alert and oriented x 3 PSYCHIATRIC:  Normal affect   ASSESSMENT:    1. Leg edema   2. Systolic murmur   3. Alcoholic cirrhosis, unspecified whether ascites present ([place])   4. Arm DVT (deep venous thromboembolism), acute, left (HCC)    PLAN:    In order of problems listed above:  Left arm DVT Patient has been recovering well since the hospitalization. Not back at work yet. Has 2 more appointments so he can be cleared. He is on Eliquis 4mBID, denies bleeding issues. Pulse good on exam today.   Cirrhosis from Alcohol  use He appears euvolemic on exam. Recent labs reviewed. Continue lasix 58m daily and spironolactone 1551mdaily. Patient says he hasn't had a drink in 3 weeks, plans on quitting.   LLE HFpEF Echo showed normal LVSF with G1DD. Lower leg edema resolved with lasix, continue current dose. He is on Nadolol.    Murmur Echo showed normal pump function with G1DD, mild aortic sclerosis   Disposition: Follow up in 6 month(s) with MD/APP   Signed, Adonnis Salceda H Ninfa MeekerPA-C  09/15/2021 11:47 AM    [place]

## 2021-09-15 ENCOUNTER — Other Ambulatory Visit: Payer: Self-pay

## 2021-09-15 ENCOUNTER — Encounter: Payer: Self-pay | Admitting: Medical

## 2021-09-15 ENCOUNTER — Ambulatory Visit (INDEPENDENT_AMBULATORY_CARE_PROVIDER_SITE_OTHER): Payer: BC Managed Care – PPO | Admitting: Medical

## 2021-09-15 VITALS — BP 130/80 | HR 83 | Ht 70.0 in | Wt 232.0 lb

## 2021-09-15 DIAGNOSIS — R011 Cardiac murmur, unspecified: Secondary | ICD-10-CM

## 2021-09-15 DIAGNOSIS — R600 Localized edema: Secondary | ICD-10-CM

## 2021-09-15 DIAGNOSIS — K7030 Alcoholic cirrhosis of liver without ascites: Secondary | ICD-10-CM

## 2021-09-15 DIAGNOSIS — I82622 Acute embolism and thrombosis of deep veins of left upper extremity: Secondary | ICD-10-CM

## 2021-09-15 NOTE — Patient Instructions (Signed)
Medication Instructions:  No changes at this time.   *If you need a refill on your cardiac medications before your next appointment, please call your pharmacy*   Lab Work: None  If you have labs (blood work) drawn today and your tests are completely normal, you will receive your results only by: [place] (if you have MyChart) OR A paper copy in the mail If you have any lab test that is abnormal or we need to change your treatment, we will call you to review the results.   Testing/Procedures: None    Follow-Up: At [facility], you and your health needs are our priority.  As part of our continuing mission to provide you with exceptional heart care, we have created designated Provider Care Teams.  These Care Teams include your primary Cardiologist (physician) and Advanced Practice Providers (APPs -  Physician Assistants and Nurse Practitioners) who all work together to provide you with the care you need, when you need it.   Your next appointment:   6 month(s)  The format for your next appointment:   In Person  Provider:   You may see Kate Sable, MD or one of the following Advanced Practice Providers on your designated Care Team:   Murray Hodgkins, NP Christell Faith, PA-C Marrianne Mood, PA-C Cadence Ivey, [place]

## 2021-09-30 ENCOUNTER — Ambulatory Visit (INDEPENDENT_AMBULATORY_CARE_PROVIDER_SITE_OTHER): Payer: BC Managed Care – PPO | Admitting: Gastroenterology

## 2021-09-30 ENCOUNTER — Encounter: Payer: Self-pay | Admitting: Gastroenterology

## 2021-09-30 ENCOUNTER — Other Ambulatory Visit: Payer: Self-pay

## 2021-09-30 ENCOUNTER — Telehealth: Payer: Self-pay | Admitting: Cardiology

## 2021-09-30 VITALS — BP 109/70 | HR 69 | Temp 98.7°F | Ht 70.0 in | Wt 235.6 lb

## 2021-09-30 DIAGNOSIS — I8510 Secondary esophageal varices without bleeding: Secondary | ICD-10-CM

## 2021-09-30 DIAGNOSIS — E559 Vitamin D deficiency, unspecified: Secondary | ICD-10-CM

## 2021-09-30 DIAGNOSIS — K7030 Alcoholic cirrhosis of liver without ascites: Secondary | ICD-10-CM | POA: Diagnosis not present

## 2021-09-30 DIAGNOSIS — K7290 Hepatic failure, unspecified without coma: Secondary | ICD-10-CM

## 2021-09-30 DIAGNOSIS — K7682 Hepatic encephalopathy: Secondary | ICD-10-CM

## 2021-09-30 DIAGNOSIS — K7460 Unspecified cirrhosis of liver: Secondary | ICD-10-CM | POA: Diagnosis not present

## 2021-09-30 MED ORDER — FUROSEMIDE 20 MG PO TABS: 20.0000 mg | ORAL_TABLET | Freq: Every day | ORAL | 2 refills | Status: AC

## 2021-09-30 MED ORDER — RIFAXIMIN 550 MG PO TABS
550.0000 mg | ORAL_TABLET | Freq: Two times a day (BID) | ORAL | 3 refills | Status: AC
Start: 2021-09-30 — End: ?

## 2021-09-30 MED ORDER — SPIRONOLACTONE 50 MG PO TABS: 50.0000 mg | ORAL_TABLET | Freq: Every day | ORAL | 2 refills | Status: AC

## 2021-09-30 NOTE — Telephone Encounter (Signed)
   [facility] Pre-operative Risk Assessment    Patient Name: Terrence Martin  DOB: December 27, 1965 MRN: [id number]  HEARTCARE STAFF:  - IMPORTANT!!!!!! Under Visit Info/Reason for Call, type in Other and utilize the format Clearance MM/DD/YY or Clearance TBD. Do not use dashes or single digits. - Please review there is not already an duplicate clearance open for this procedure. - If request is for dental extraction, please clarify the # of teeth to be extracted. - If the patient is currently at the dentist's office, call Pre-Op Callback Staff (MA/nurse) to input urgent request.  - If the patient is not currently in the dentist office, please route to the Pre-Op pool.  Request for surgical clearance:  What type of surgery is being performed? EGD procedure   When is this surgery scheduled? 11/04/21  What type of clearance is required (medical clearance vs. Pharmacy clearance to hold med vs. Both)? both  Are there any medications that need to be held prior to surgery and how long? Not listed, please advise if needed  Practice name and name of physician performing surgery? [facility]  What is the office phone number? [phone]   7.   What is the office fax number? [phone]  8.   Anesthesia type (None, local, MAC, general) ? Not listed    Ace Gins 09/30/2021, 3:29 PM  _________________________________________________________________   (provider comments below)

## 2021-09-30 NOTE — Patient Instructions (Signed)
Take rifaximin/Xifaxan 550 mg twice daily for hepatic encephalopathy 2.  Continue your blood thinner Eliquis twice daily 3.  Continue nadolol 20 mg once a day 4.  Follow strict low-sodium diet 5.  Continue Lasix 20 mg daily and spironolactone 50 mg daily  Please call our office to speak with my nurse Ulyess Blossom [phone] during business hours from 8am to 4pm if you have any questions/concerns. During after hours, you will be redirected to on call GI physician. For any emergency please call 911 or go the nearest emergency room.    Cephas Darby, MD [address]  [place]  [place], [place] [address]  Main: [phone]  Fax: [phone]

## 2021-09-30 NOTE — Telephone Encounter (Signed)
Left VM  Pt is on eliquis for DVT.  Cadence You just saw him on 09/15/21. Can you clear him for EGD?

## 2021-09-30 NOTE — Telephone Encounter (Signed)
Patient returning call.

## 2021-09-30 NOTE — Progress Notes (Signed)
Cephas Darby, MD [address]  [place]  [place], [place] [address]  Main: [phone]  Fax: [phone]    Gastroenterology Consultation  Referring Provider:     No ref. provider found Primary Care Physician:  Pcp, No Primary Gastroenterologist:  Dr. Cephas Darby Reason for Consultation:     Decompensated alcoholic cirrhosis        HPI:   Terrence Martin is a 55 y.o. male referred by Dr. Merryl Hacker, No  for consultation & management of decompensated alcoholic cirrhosis.  Patient is admitted to [facility] on 02/17/2021 secondary to hematemesis, hemorrhagic shock, acute blood loss anemia.  He is found to have decompensated cirrhosis, requiring pressor support, was medically treated appropriately, underwent upper endoscopy, found to have large esophageal varices and 3 bands were placed.  He did have severe portal hypertensive gastropathy.  Patient was discharged on nadolol, apparently he said he took it for 3 days postdischarge.  He went to the ER since discharge due to swelling of legs from lower abdomen below down to feet, symmetric.  He was seen by cardiology, started on Lasix 40 mg daily.  Patient reports that the swelling has more or less remained the same.  He reports that he has received counseling for low-sodium diet when he was hospitalized.  He denies any further episodes of melena, hematemesis, rectal bleeding.  He denies any abdominal pain.  His main concern today is swelling of legs including scrotal edema as well as abdominal swelling.  He reports that he did not drink alcohol since discharge. Patient's hemoglobin since discharge improved, 8.3, platelets 173 He works as a Librarian, academic at [employer]  Follow-up visit 09/30/2021 Patient is here for follow-up of decompensated alcoholic cirrhosis.  He was recently admitted to [facility] secondary to hematemesis, melena resulting in acute blood loss anemia, s/p EGD on 9/27, found to have large esophageal varices with red wale signs, s/p ligation x3,  also has severe portal hypertensive gastropathy.  Hospital course was complicated by acute hepatic encephalopathy resulting in ICU admission, treated with lactulose and rifaximin aggressively.  No evidence of portal vein thrombosis.  Patient has been restarted on nadolol after acute issues resolved.  He also developed acute left upper extremity DVT, was initiated on Eliquis.  He reports that since discharge, he has been doing well overall.  He denies recurrent episodes of hematemesis, melena.  He denies abdominal pain, distention, swelling of legs.  He denies pain and localized swelling of left upper extremity.  He has been recovering well, appetite is good, trying to follow a low-sodium diet.  He has not gone back to work yet.  He acknowledges taking nadolol, Eliquis and spironolactone only.  He is not taking rifaximin.  Patient does not have any concerns today.  He reports that he has not restarted drinking alcohol since discharge.  NSAIDs: None  Antiplts/Anticoagulants/Anti thrombotics: None  GI Procedures:  EGD 08/24/2021 - Normal duodenal bulb and second portion of the duodenum. - Portal hypertensive gastropathy. - Large (> 5 mm) esophageal varices with stigmata of recent bleeding. Incompletely eradicated. Banded x3. - No specimens collected.  EGD and colonoscopy 04/01/2021 - Normal duodenal bulb and second portion of the duodenum. - Portal hypertensive gastropathy. - Small (< 5 mm) esophageal varices with no bleeding and no stigmata of recent bleeding. - No specimens collected.  - Three 3 to 5 mm polyps in the descending colon and in the cecum, removed with a cold snare. Resected and retrieved. - Diverticulosis in  the sigmoid colon. - Non-bleeding external hemorrhoids.  DIAGNOSIS:  A. COLON POLYP, CECUM; COLD SNARE:  - MULTIPLE FRAGMENTS OF TUBULAR ADENOMA.  - NEGATIVE FOR HIGH-GRADE DYSPLASIA AND MALIGNANCY.   B. COLON POLYPS X2, DESCENDING; COLD SNARE:  - FRAGMENTS (X2) OF TUBULAR  ADENOMAS.  - SINGLE FRAGMENT OF BENIGN COLONIC MUCOSA WITH SUPERFICIAL REACTIVE  CHANGES AND SMALL LYMPHOID AGGREGATE.  - NEGATIVE FOR HIGH-GRADE DYSPLASIA AND MALIGNANCY.  EGD 02/17/2021 - Normal duodenal bulb and second portion of the duodenum. - Portal hypertensive gastropathy. - Normal gastric body, incisura and antrum. - Large (> 5 mm) esophageal varices with stigmata of recent bleeding. Incompletely eradicated. Banded x 3. - No specimens collected.  Past Medical History:  Diagnosis Date   GIB (gastrointestinal bleeding)    Hypertension    IDA (iron deficiency anemia) 08/24/2020   PUD (peptic ulcer disease)     Past Surgical History:  Procedure Laterality Date   COLONOSCOPY WITH PROPOFOL N/A 04/01/2021   Procedure: COLONOSCOPY WITH PROPOFOL;  Surgeon: Lin Landsman, MD;  Location: [facility] END[facility]COPY;  Service: Gastroenterology;  Laterality: N/A;   ESOPHAGOGASTRODUODENOSCOPY (EGD) WITH PROPOFOL N/A 02/17/2021   Procedure: ESOPHAGOGASTRODUODENOSCOPY (EGD) WITH PROPOFOL;  Surgeon: Lin Landsman, MD;  Location: [facility]COPY;  Service: Gastroenterology;  Laterality: N/A;   ESOPHAGOGASTRODUODENOSCOPY (EGD) WITH PROPOFOL N/A 04/01/2021   Procedure: ESOPHAGOGASTRODUODENOSCOPY (EGD) WITH PROPOFOL;  Surgeon: Lin Landsman, MD;  [facility];  Service: Gastroenterology;  Laterality: N/A;   ESOPHAGOGASTRODUODENOSCOPY (EGD) WITH PROPOFOL N/A 08/24/2021   Procedure: ESOPHAGOGASTRODUODENOSCOPY (EGD) WITH PROPOFOL;  Surgeon: Lin Landsman, MD;  Location: [facility] ENDOSCOPY;  Service: Gastroenterology;  Laterality: N/A;    Current Outpatient Medications:    apixaban (ELIQUIS) 5 MG TABS tablet, Take 1 tablet (5 mg total) by mouth 2 (two) times daily., Disp: 60 tablet, Rfl: 0   pantoprazole (PROTONIX) 40 MG tablet, Take 1 tablet (40 mg total) by mouth 2 (two) times daily before a meal., Disp: 60 tablet, Rfl: 0   rifaximin (XIFAXAN) 550 MG TABS tablet, Take 1 tablet (550 mg  total) by mouth 2 (two) times daily., Disp: 60 tablet, Rfl: 3   SV VITAMIN B-12 ER 1000 MCG TBCR, Take 1 tablet by mouth daily., Disp: , Rfl:    furosemide (LASIX) 20 MG tablet, Take 1 tablet (20 mg total) by mouth daily., Disp: 90 tablet, Rfl: 2   nadolol (CORGARD) 20 MG tablet, Take 1 tablet (20 mg total) by mouth daily. (Patient not taking: Reported on 09/15/2021), Disp: 30 tablet, Rfl: 2   spironolactone (ALDACTONE) 50 MG tablet, Take 1 tablet (50 mg total) by mouth daily., Disp: 90 tablet, Rfl: 2   Vitamin D, Ergocalciferol, (DRISDOL) 1.25 MG (50000 UNIT) CAPS capsule, Take 1 capsule (50,000 Units total) by mouth every 7 (seven) days. (Patient not taking: No sig reported), Disp: 12 capsule, Rfl: 0   Family History  Problem Relation Age of Onset   Lung cancer Mother    Cancer Father    Heart attack Brother      Social History   Tobacco Use   Smoking status: Never   Smokeless tobacco: Never  Vaping Use   Vaping Use: Never used  Substance Use Topics   Alcohol use: Not Currently    Alcohol/week: 72.0 standard drinks    Types: 72 Cans of beer per week   Drug use: Never    Allergies as of 09/30/2021   (No Known Allergies)    Review of Systems:    All systems  reviewed and negative except where noted in HPI.   Physical Exam:  BP 109/70 (BP Location: Left Arm, Patient Position: Sitting, Cuff Size: Large)   Pulse 69   Temp 98.7 F (37.1 C) (Oral)   Ht 5\' 10"  (1.778 m)   Wt 235 lb 9.6 oz (106.9 kg)   BMI 33.81 kg/m  No LMP for male patient.  General:   Alert,  Well-developed, well-nourished, pleasant and cooperative in NAD Head:  Normocephalic and atraumatic. Eyes:  Sclera clear, no icterus.   Conjunctiva pink. Ears:  Normal auditory acuity. Nose:  No deformity, discharge, or lesions. Mouth:  No deformity or lesions,oropharynx pink & moist. Neck:  Supple; no masses or thyromegaly. Lungs:  Respirations even and unlabored.  Clear throughout to auscultation.   No wheezes,  crackles, or rhonchi. No acute distress. Heart:  Regular rate and rhythm; no murmurs, clicks, rubs, or gallops. Abdomen:  Normal bowel sounds. Soft, non-tender and non-distended without masses, hepatosplenomegaly or hernias noted.  No guarding or rebound tenderness.   Rectal: Not performed Msk:  Symmetrical without gross deformities. Good, equal movement & strength bilaterally. Pulses:  Normal pulses noted. Extremities:  clubbing and 3+ edema.  No cyanosis. Neurologic:  Alert and oriented x3;  grossly normal neurologically. Skin:  Intact without significant lesions or rashes. No jaundice. Psych:  Alert and cooperative. Normal mood and affect.  Imaging Studies: Reviewed  Assessment and Plan:   Terrence Martin is a 55 y.o. male with decompensated alcoholic cirrhosis with esophageal variceal bleed s/p variceal ligation, hepatic encephalopathy  Esophageal varices s/p variceal ligation in March, May 2022 Recommend repeat EGD for variceal surveillance and ligation if needed Continue nadolol 20 mg daily Ultrasound Dopplers did not reveal portal vein thrombosis Recheck CBC, iron panel, B12 and folate levels today  Decompensated alcoholic cirrhosis Viral hepatitis panel negative,  No evidence of thrombocytopenia, does have iron deficiency anemia, status post parenteral iron therapy.  Recheck labs today Volume status: Currently euvolemic, recommend to start Lasix 20 mg daily and spironolactone 50 mg daily, check BMP  today.   Discussed about strict low-sodium diet, information provided HRS none PSE: Discussed about regular bowel movements and take MiraLAX as needed.  Reiterated on rifaximin 550 mg twice daily, long-term, prescription sent [place] screening: No evidence of liver lesions, AFP normal, recommend ultrasound liver in 6 months Recommend Shingrix vaccine and pneumonia vaccine, annual influenza vaccine.  Patient received Prevnar, he is due for Pneumovax Reiterated on complete abstinence from  alcohol use  Tubular adenoma of the colon Recommend surveillance colonoscopy in 03/2026  Left upper extremity DVT: Detected during hospitalization in 07/2021  started on Eliquis 5 mg twice daily, continue the same Referral to hematology for further evaluation and determine the course of anticoagulation  Vitamin D deficiency, severe: Vitamin D replaced Recheck vitamin D levels today  I have discussed alternative options, risks & benefits,  which include, but are not limited to, bleeding, infection, perforation,respiratory complication & drug reaction.  The patient agrees with this plan & written consent will be obtained.     Follow up in 3 months   Cephas Darby, MD

## 2021-09-30 NOTE — Progress Notes (Signed)
New blood thinner request sent

## 2021-10-01 ENCOUNTER — Telehealth: Payer: Self-pay

## 2021-10-01 LAB — COMPREHENSIVE METABOLIC PANEL
ALT: 24 IU/L (ref 0–44)
AST: 48 IU/L — ABNORMAL HIGH (ref 0–40)
Albumin/Globulin Ratio: 0.9 — ABNORMAL LOW (ref 1.2–2.2)
Albumin: 3.4 g/dL — ABNORMAL LOW (ref 3.8–4.9)
Alkaline Phosphatase: 99 IU/L (ref 44–121)
BUN/Creatinine Ratio: 8 — ABNORMAL LOW (ref 9–20)
BUN: 9 mg/dL (ref 6–24)
Bilirubin Total: 1.2 mg/dL (ref 0.0–1.2)
CO2: 21 mmol/L (ref 20–29)
Calcium: 9.7 mg/dL (ref 8.7–10.2)
Chloride: 105 mmol/L (ref 96–106)
Creatinine, Ser: 1.12 mg/dL (ref 0.76–1.27)
Globulin, Total: 3.8 g/dL (ref 1.5–4.5)
Glucose: 103 mg/dL — ABNORMAL HIGH (ref 70–99)
Potassium: 5.4 mmol/L — ABNORMAL HIGH (ref 3.5–5.2)
Sodium: 136 mmol/L (ref 134–144)
Total Protein: 7.2 g/dL (ref 6.0–8.5)
eGFR: 77 mL/min/{1.73_m2} (ref >59)

## 2021-10-01 LAB — IRON,TIBC AND FERRITIN PANEL
Ferritin: 56 ng/mL (ref 30–400)
Iron Saturation: 20 % (ref 15–55)
Iron: 66 ug/dL (ref 38–169)
Total Iron Binding Capacity: 329 ug/dL (ref 250–450)
UIBC: 263 ug/dL (ref 111–343)

## 2021-10-01 LAB — CBC
Hematocrit: 35.8 % — ABNORMAL LOW (ref 37.5–51.0)
Hemoglobin: 11.6 g/dL — ABNORMAL LOW (ref 13.0–17.7)
MCH: 27.6 pg (ref 26.6–33.0)
MCHC: 32.4 g/dL (ref 31.5–35.7)
MCV: 85 fL (ref 79–97)
Platelets: 145 10*3/uL — ABNORMAL LOW (ref 150–450)
RBC: 4.21 x10E6/uL (ref 4.14–5.80)
RDW: 15 % (ref 11.6–15.4)
WBC: 5.8 10*3/uL (ref 3.4–10.8)

## 2021-10-01 LAB — B12 AND FOLATE PANEL
Folate: 11 ng/mL (ref >3.0)
Vitamin B-12: 1010 pg/mL (ref 232–1245)

## 2021-10-01 LAB — VITAMIN D 25 HYDROXY (VIT D DEFICIENCY, FRACTURES): Vit D, 25-Hydroxy: 24.4 ng/mL — ABNORMAL LOW (ref 30.0–100.0)

## 2021-10-01 NOTE — Telephone Encounter (Signed)
-----   Message from Lin Landsman, MD sent at 10/01/2021 11:02 AM EDT ----- His potassium is high. Please inform patient that he should stop taking spironolactone and recheck potassium Monday, please order serum potassium for monday  RV

## 2021-10-01 NOTE — Telephone Encounter (Signed)
Called patient no answer left voicemail for a call back

## 2021-10-04 ENCOUNTER — Telehealth: Payer: Self-pay

## 2021-10-04 ENCOUNTER — Other Ambulatory Visit: Payer: Self-pay

## 2021-10-04 DIAGNOSIS — E875 Hyperkalemia: Secondary | ICD-10-CM

## 2021-10-04 NOTE — Telephone Encounter (Signed)
Patient with diagnosis of left brachial vein DVT dx 08/27/21 on Eliquis for anticoagulation, unchanged on follow up ultrasound 08/31/21. Discharge summary states this will be followed by PCP, would defer to them for recommendation regarding anticoag interruption.

## 2021-10-04 NOTE — Telephone Encounter (Signed)
-----   Message from Lin Landsman, MD sent at 10/01/2021 11:02 AM EDT ----- His potassium is high. Please inform patient that he should stop taking spironolactone and recheck potassium Monday, please order serum potassium for monday  RV

## 2021-10-04 NOTE — Telephone Encounter (Signed)
I will forward to pre op pharm-d for input on blood thinners

## 2021-10-04 NOTE — Telephone Encounter (Signed)
CALLED PATIENT NO ANSWER LEFT VOICEMAIL FOR A CALL BACK ? ?

## 2021-10-04 NOTE — Telephone Encounter (Signed)
Patient called back I gave him he results and he is gonna have his potassium retested at either one of the [facility] drawing stations or come by here during lab hours

## 2021-10-04 NOTE — Telephone Encounter (Signed)
Called alternate phone number no answer left voicemail for  a call back

## 2021-10-04 NOTE — Progress Notes (Signed)
Ordered new [facility] for patient to come by and have done

## 2021-10-05 ENCOUNTER — Telehealth: Payer: Self-pay

## 2021-10-05 ENCOUNTER — Telehealth: Payer: Self-pay | Admitting: Gastroenterology

## 2021-10-05 NOTE — Telephone Encounter (Signed)
Called patient he stated he got it fixed now so medicine should be approved now

## 2021-10-05 NOTE — Telephone Encounter (Signed)
    Patient Name: Terrence Martin  DOB: 1966/02/07 MRN: [id number]  Primary Cardiologist: Kate Sable, MD  Chart reviewed as part of pre-operative protocol coverage. Given past medical history and time since last visit, based on ACC/AHA guidelines, Yoshio Seliga would be at acceptable risk for the planned procedure without further cardiovascular testing.   Case discussed with Cadence Furth who recently saw the patient, she has cleared the patient to proceed with GI procedure.   Note, patient's blood thinner Eliquis was not prescribed by cardiology service and will need to be managed by either PCP or hematology. Patient has upcoming visit with Dr. Earlie Server on 11/29.  The patient was advised that if he develops new symptoms prior to surgery to contact our office to arrange for a follow-up visit, and he verbalized understanding.  I will route this recommendation to the requesting party via Epic fax function and remove from pre-op pool.  Please call with questions.  [place], [place] 10/05/2021, 8:49 AM

## 2021-10-06 NOTE — Telephone Encounter (Signed)
error 

## 2021-10-26 ENCOUNTER — Inpatient Hospital Stay: Payer: BC Managed Care – PPO | Attending: Oncology | Admitting: Oncology

## 2021-11-03 ENCOUNTER — Telehealth: Payer: Self-pay

## 2021-11-03 NOTE — Telephone Encounter (Signed)
PATIENT CALLED I RETURNED CALL HE WANTS TO CANCEL PROCEDURE FOR TOMORROW AND RESCHEDULE FOR 12/09/2021 I SENT NEW REFERRAL AND CALLED ENDO

## 2021-12-08 ENCOUNTER — Encounter: Payer: Self-pay | Admitting: Gastroenterology

## 2021-12-09 ENCOUNTER — Ambulatory Visit
Admission: RE | Admit: 2021-12-09 | Payer: BC Managed Care – PPO | Source: Home / Self Care | Admitting: Gastroenterology

## 2021-12-09 ENCOUNTER — Encounter: Admission: RE | Payer: Self-pay | Source: Home / Self Care

## 2021-12-09 HISTORY — DX: Alcoholic cirrhosis of liver without ascites: K70.30

## 2021-12-09 SURGERY — ESOPHAGOGASTRODUODENOSCOPY (EGD) WITH PROPOFOL
Anesthesia: General

## 2022-03-11 ENCOUNTER — Other Ambulatory Visit: Payer: Self-pay | Admitting: Gastroenterology

## 2022-03-11 DIAGNOSIS — K7290 Hepatic failure, unspecified without coma: Secondary | ICD-10-CM

## 2023-02-26 IMAGING — CT CT HEAD W/O CM
4 of 5 series · 16 of 47 positions shown, 18 images · non-contrast
Comparison: 11/09/2016

CLINICAL DATA: Encephalopathy

EXAM:
CT HEAD WITHOUT CONTRAST
TECHNIQUE: Contiguous axial images were obtained from the base of the skull
through the vertex without intravenous contrast.

[Series 2: head wo · axial · 0.44mm/px · z∈[-173,-58]mm · 8 of 31 slices shown, 10 images (1 of 2)]
[im 4/31  brain]
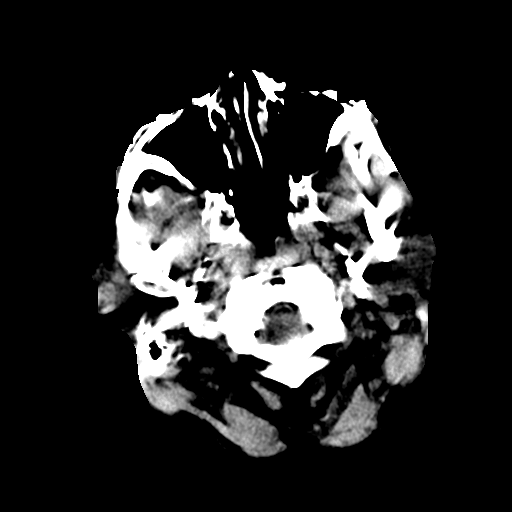
[im 4/31  bone]
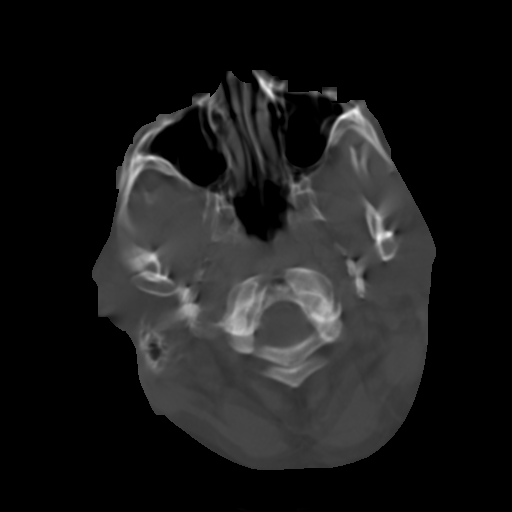
[im 7/31  brain]
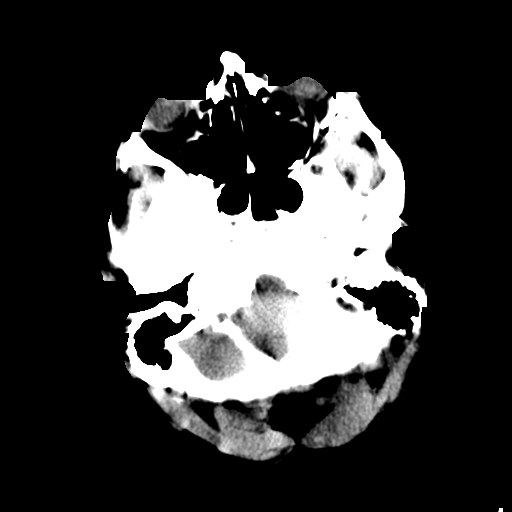
[im 11/31  brain]
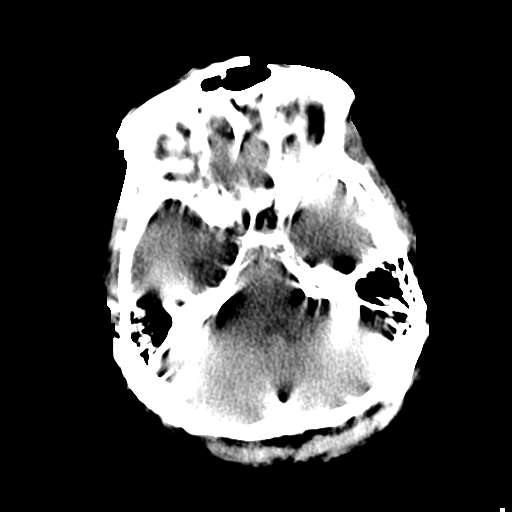
[im 14/31  brain]
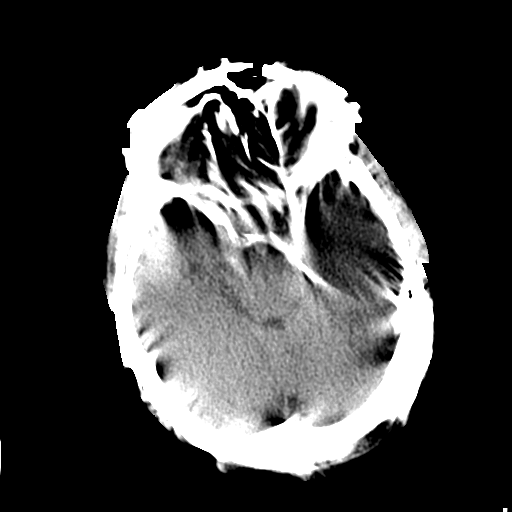
[im 17/31  brain]
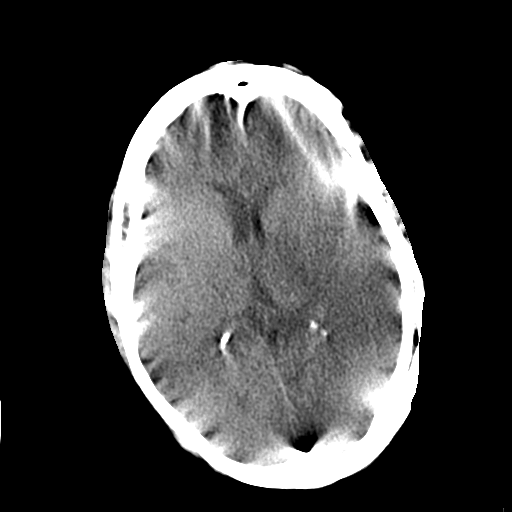
[im 17/31  bone]
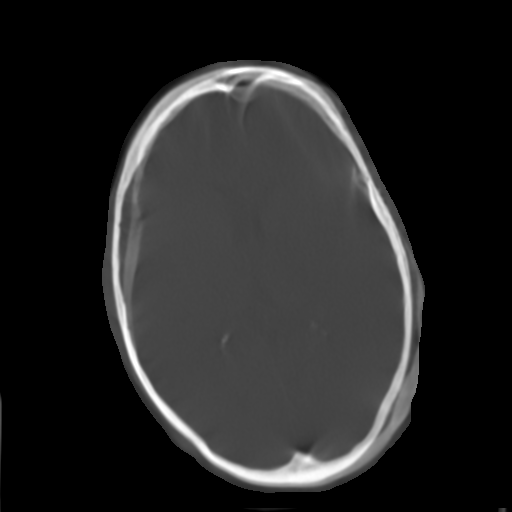
[im 21/31  brain]
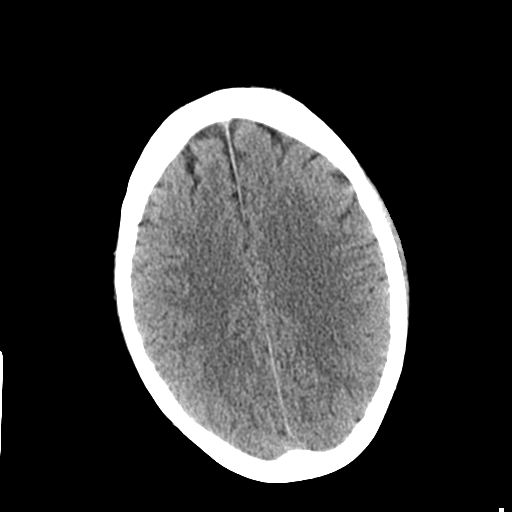
[im 24/31  brain]
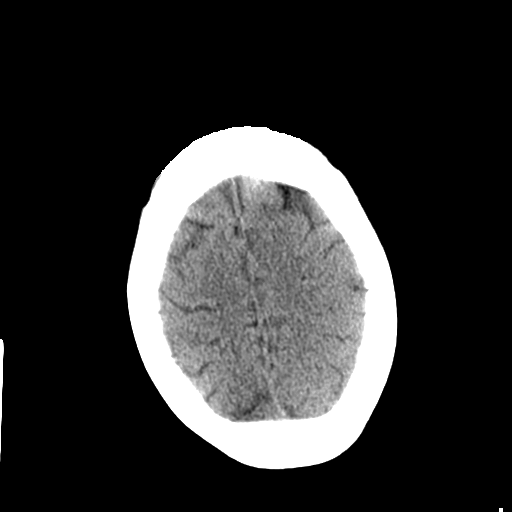
[im 27/31  brain]
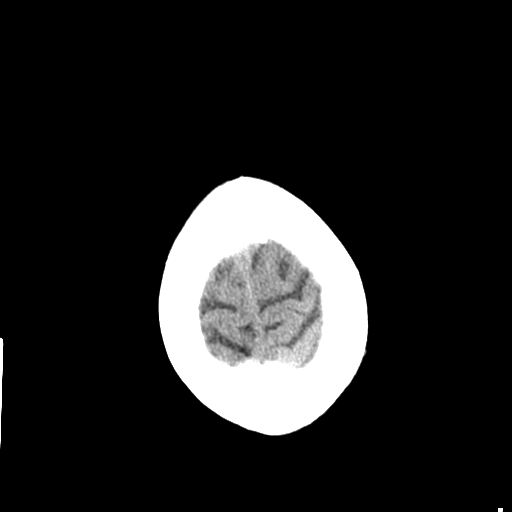

[Series 3: head wo · axial · 0.44mm/px · z∈[-173,-158]mm · 2 of 31 slices shown (2 of 2)]
[im 4/31  brain]
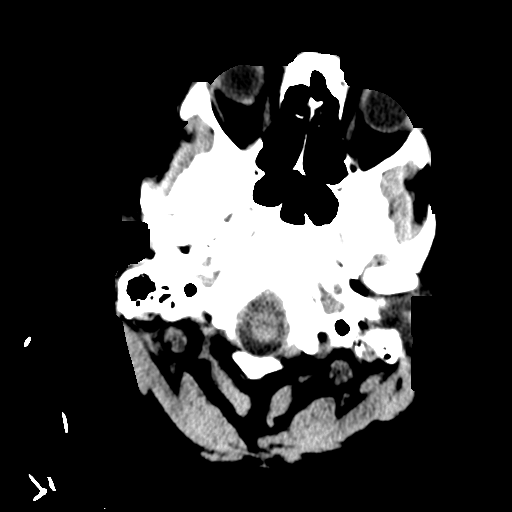
[im 7/31  brain]
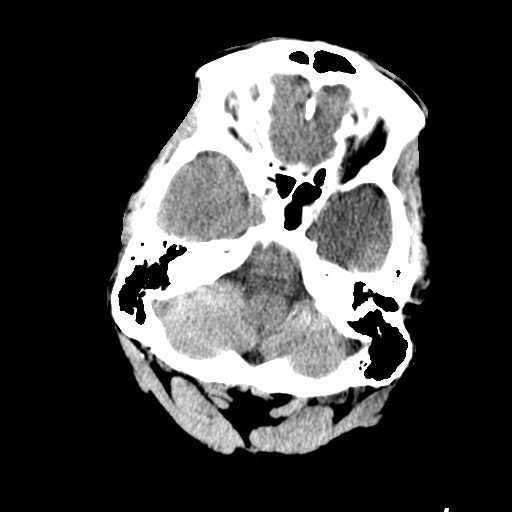

[Series 5: coronal soft tissue · coronal · 0.31mm/px · 3 of 69 slices shown]
[im 23/69  brain]
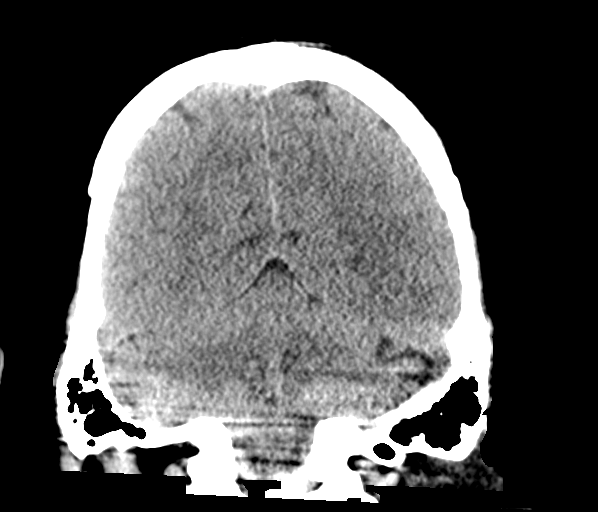
[im 31/69  brain]
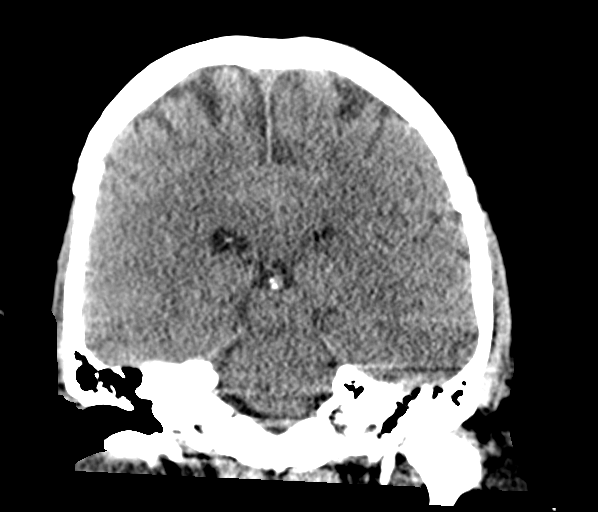
[im 38/69  brain]
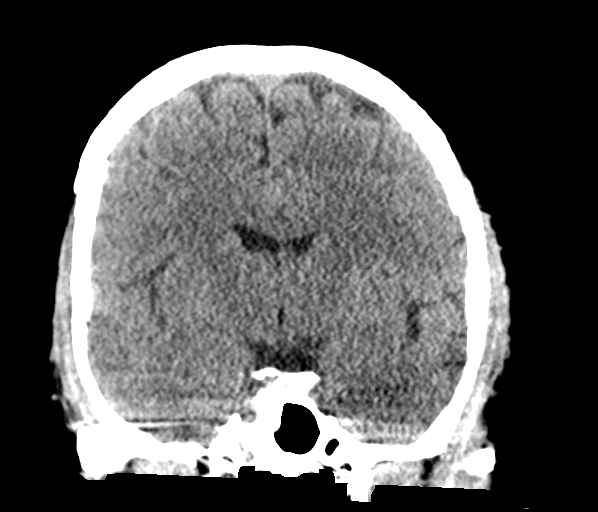

[Series 6: sagittal soft tissue · sagittal · 0.31mm/px · 3 of 52 slices shown]
[im 18/52  brain]
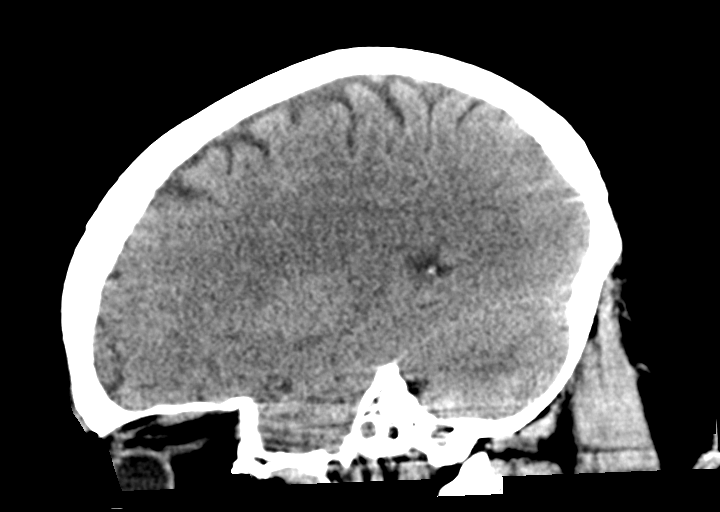
[im 26/52  brain]
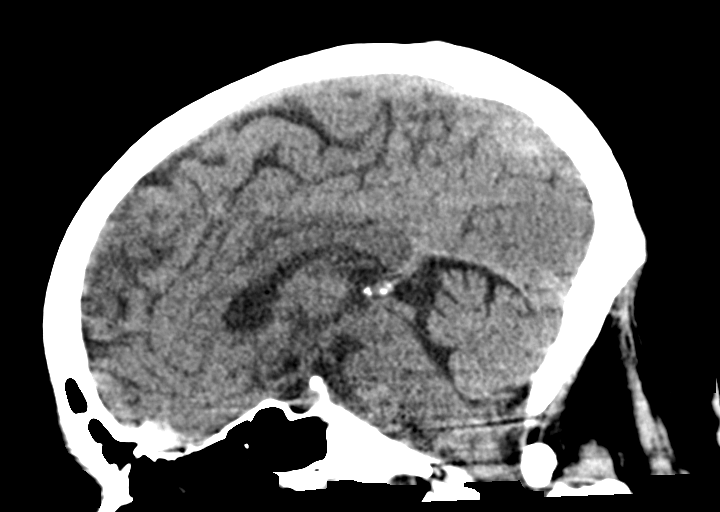
[im 35/52  brain]
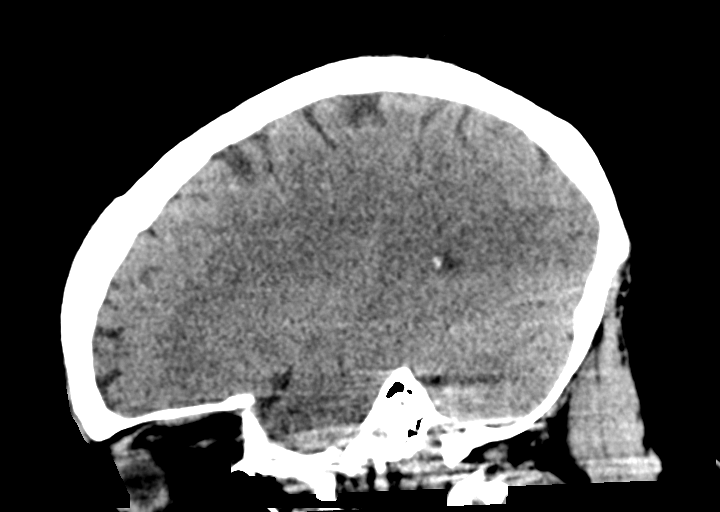

[16 of 47 positions shown; findings below may reference images not displayed]

FINDINGS: Brain: There is no mass, hemorrhage or extra-axial collection. The
size and configuration of the ventricles and extra-axial CSF spaces
are normal. The brain parenchyma is normal, without acute or chronic
infarction.

Vascular: No abnormal hyperdensity of the major intracranial
arteries or dural venous sinuses. No intracranial atherosclerosis.

Skull: The visualized skull base, calvarium and extracranial soft
tissues are normal.

Sinuses/Orbits: No fluid levels or advanced mucosal thickening of
the visualized paranasal sinuses. No mastoid or middle ear effusion.
The orbits are normal.
IMPRESSION: Normal head CT.

## 2023-02-28 IMAGING — US US EXTREM  UP VENOUS*L*
1 series · 13 of 24 positions shown · non-contrast
Comparison: None.

CLINICAL DATA: Swelling today after IV placed.



[Series 1: us venous img upper uni left (dvt) · portal-venous · 13 of 29 slices shown]
[im 1/29]
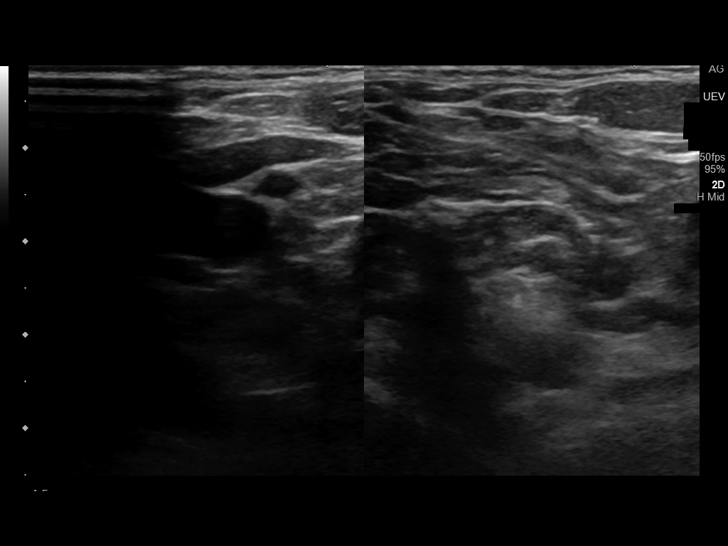
[im 3/29]
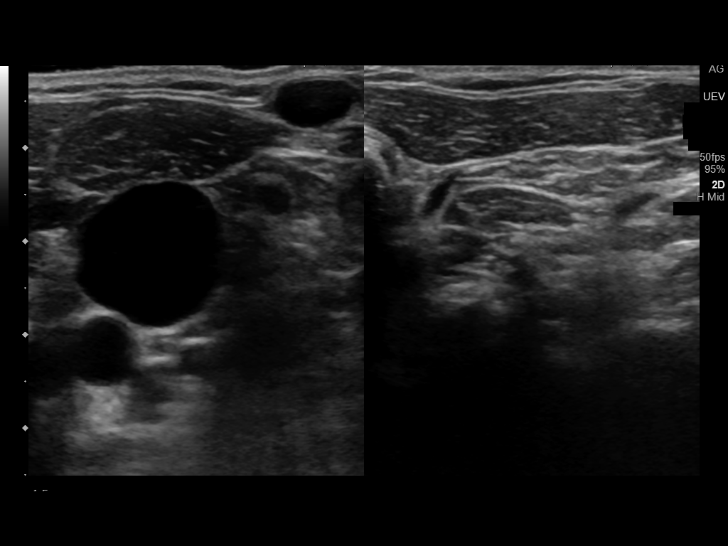
[im 5/29]
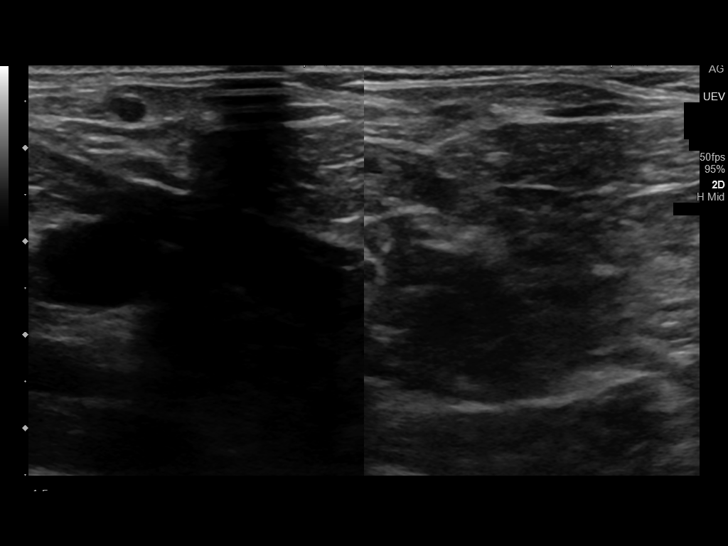
[im 8/29]
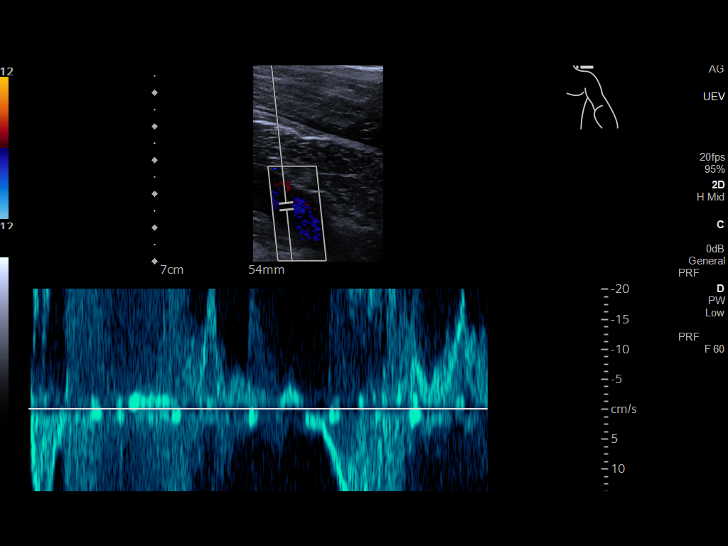
[im 10/29]
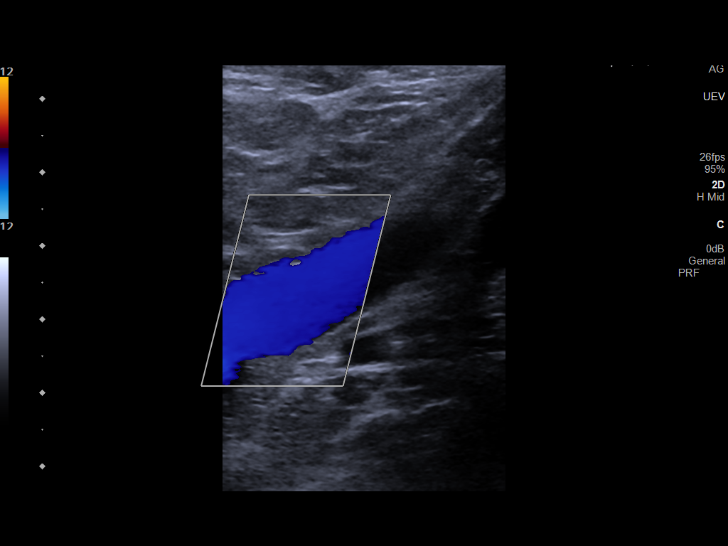
[im 13/29]
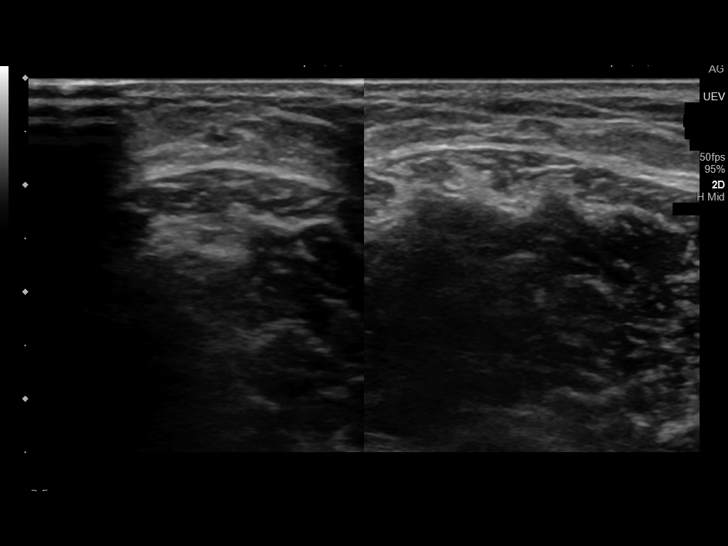
[im 15/29]
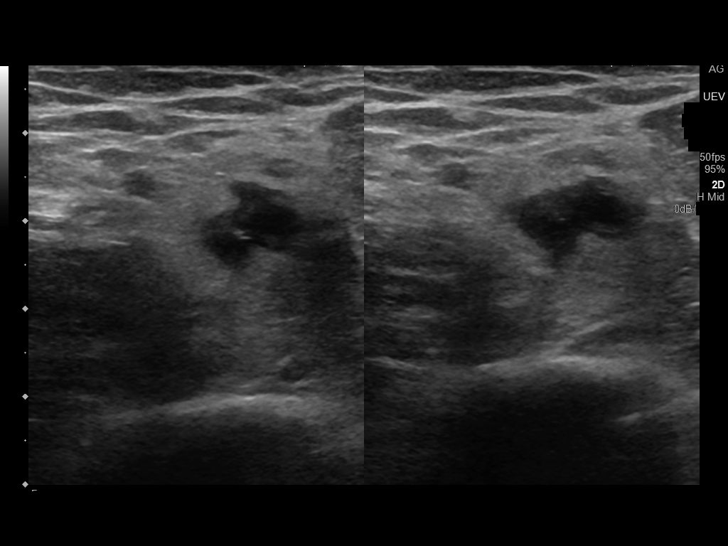
[im 16/29]
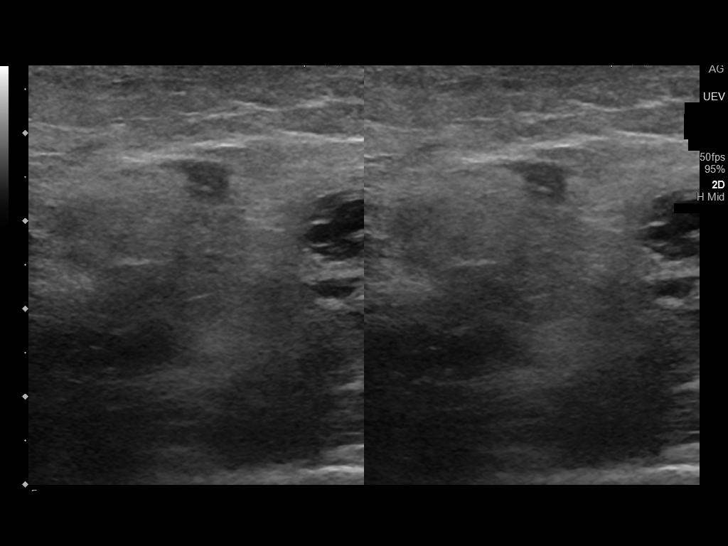
[im 19/29]
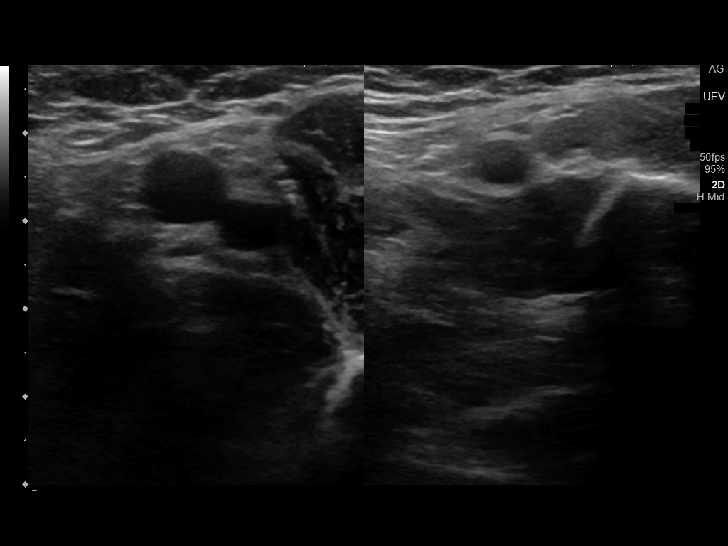
[im 21/29]
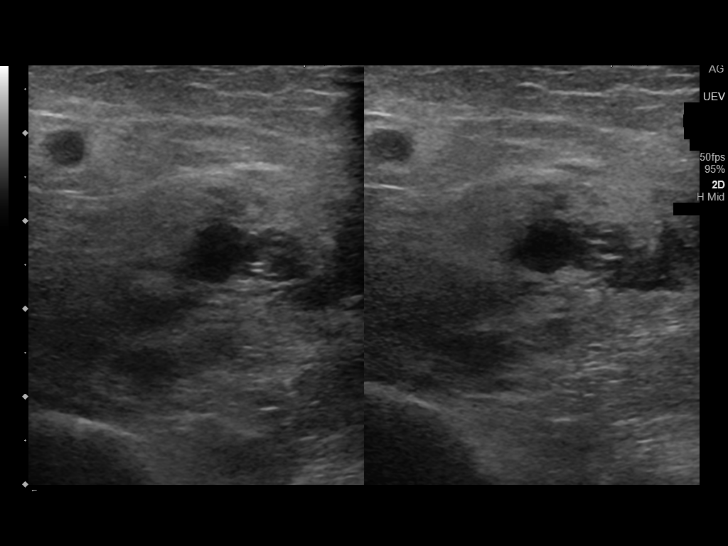
[im 24/29]
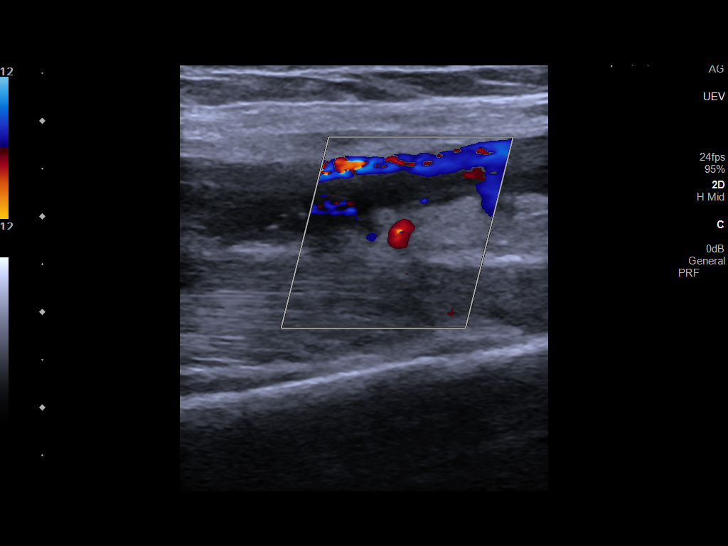
[im 26/29]
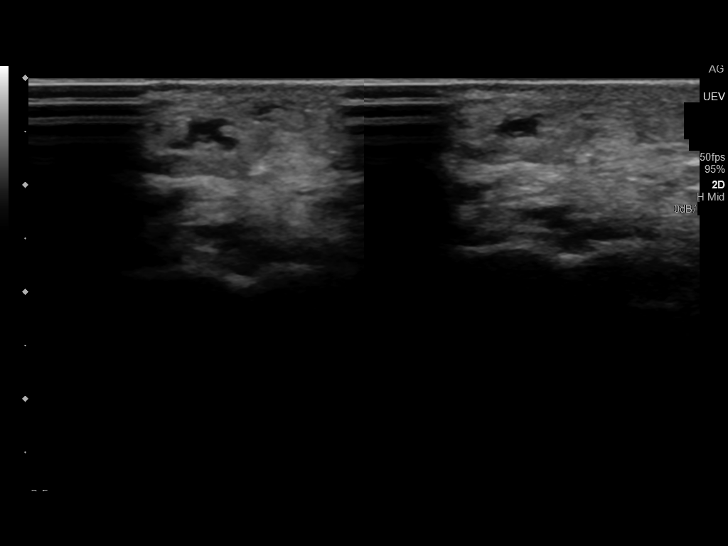
[im 29/29]
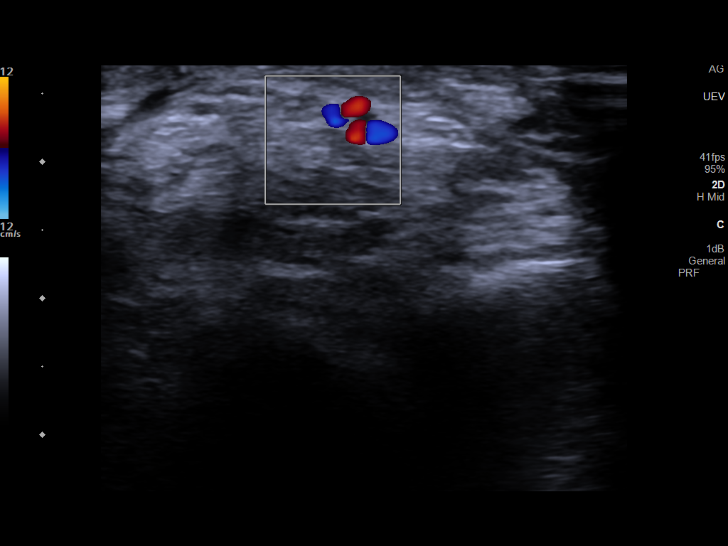

[13 of 24 positions shown; findings below may reference images not displayed]

FINDINGS: Contralateral Subclavian Vein: Respiratory phasicity is normal and
symmetric with the symptomatic side. No evidence of thrombus. Normal
compressibility.

Internal Jugular Vein: No evidence of thrombus. Normal
compressibility, respiratory phasicity and response to augmentation.

Subclavian Vein: No evidence of thrombus. Normal compressibility,
respiratory phasicity and response to augmentation.

Axillary Vein: No evidence of thrombus. Normal compressibility,
respiratory phasicity and response to augmentation.

Cephalic Vein: No evidence of thrombus. Normal compressibility,
respiratory phasicity and response to augmentation.

Basilic Vein: Clot is visualized within the proximal to distal
basilic vein with limited compressibility.

Brachial Veins: Clot is visualized within the mid to distal brachial
vein with limited compressibility.

Radial Veins: No evidence of thrombus. Normal compressibility,
respiratory phasicity and response to augmentation.

Ulnar Veins: No evidence of thrombus. Normal compressibility,
respiratory phasicity and response to augmentation.

Other Findings:  IV noted at the elbow.
IMPRESSION: Positive for DVT within the left basilic and brachial veins.

These results will be called to the ordering clinician or
representative by the Radiologist Assistant, and communication
documented in the PACS or [REDACTED].

## 2023-03-01 IMAGING — US US HEPATIC LIVER DOPPLER
1 series · 13 of 25 positions shown · non-contrast
Comparison: February 18, 2021.

CLINICAL DATA: Acute hepatic encephalopathy.

EXAM:
DUPLEX ULTRASOUND OF LIVER
TECHNIQUE: Color and duplex Doppler ultrasound was performed to evaluate the
hepatic in-flow and out-flow vessels.

[Series 1: us liver doppler · 75 acquisitions, 13 frames shown]
[im 1/75]
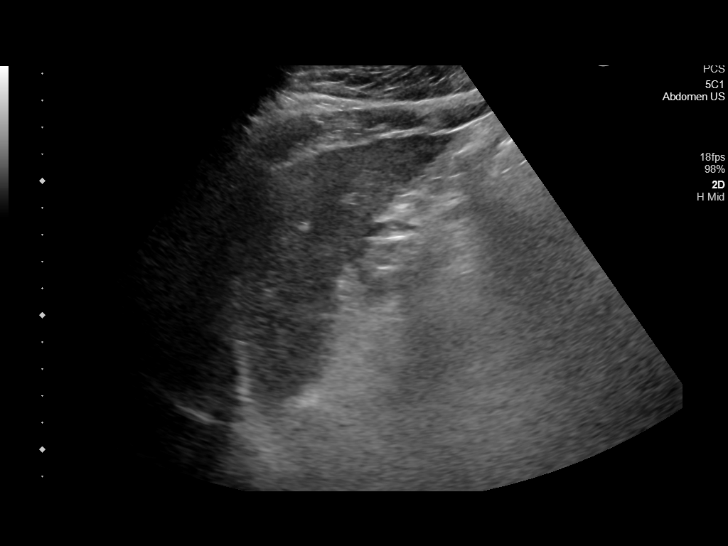
[im 7/75]
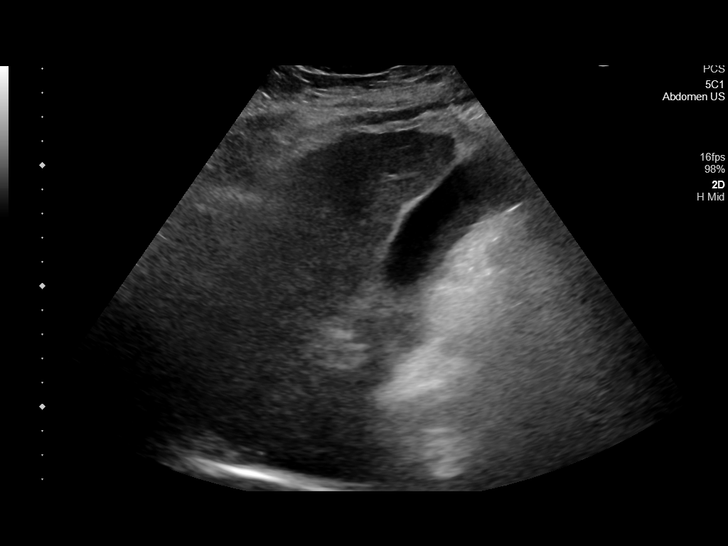
[im 13/75]
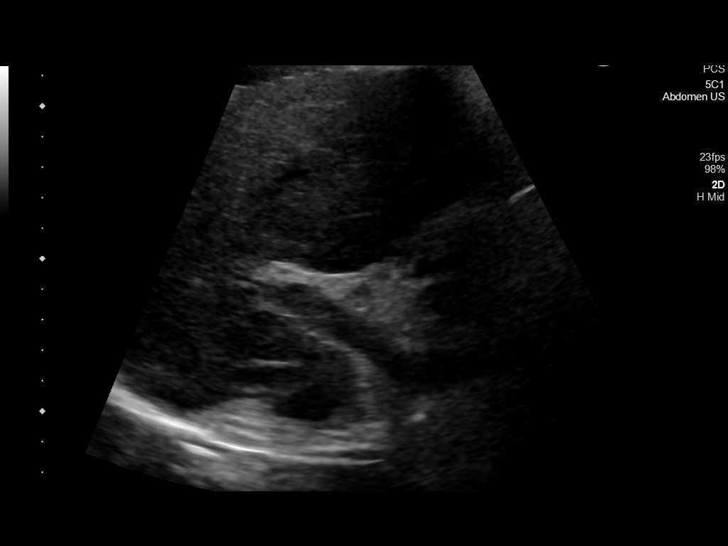
[im 19/75]
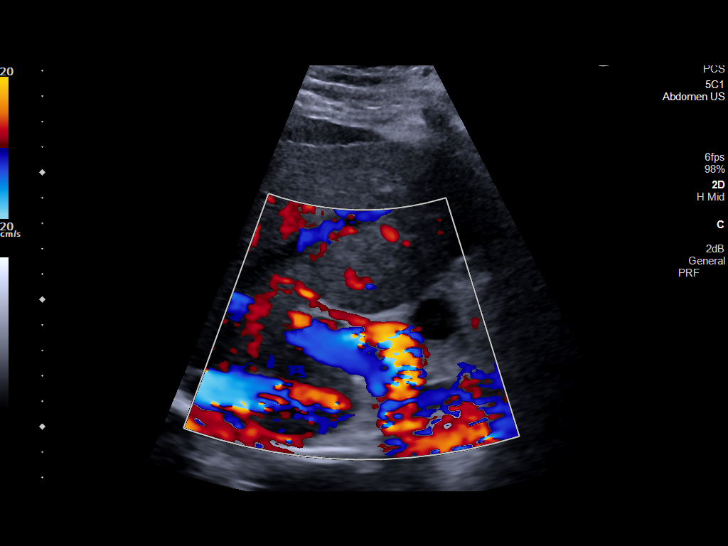
[im 25/75]
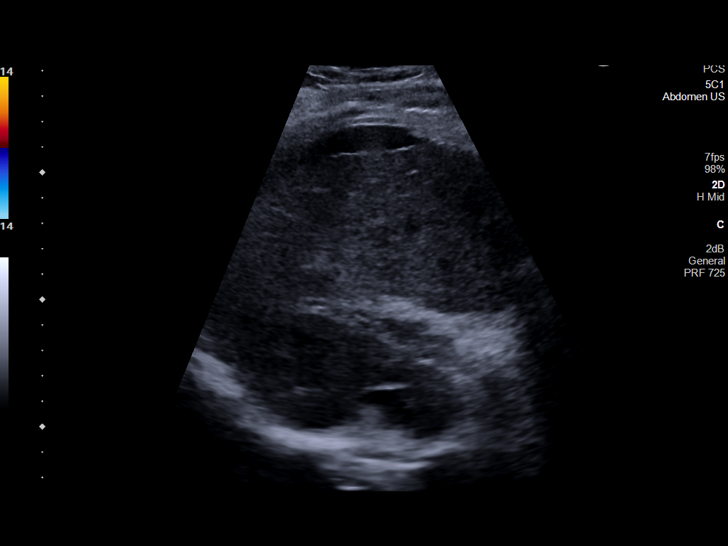
[im 31/75]
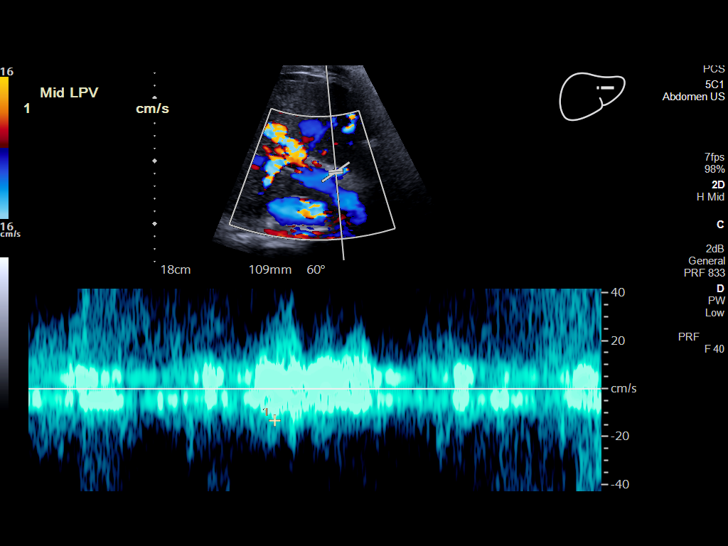
[im 38/75]
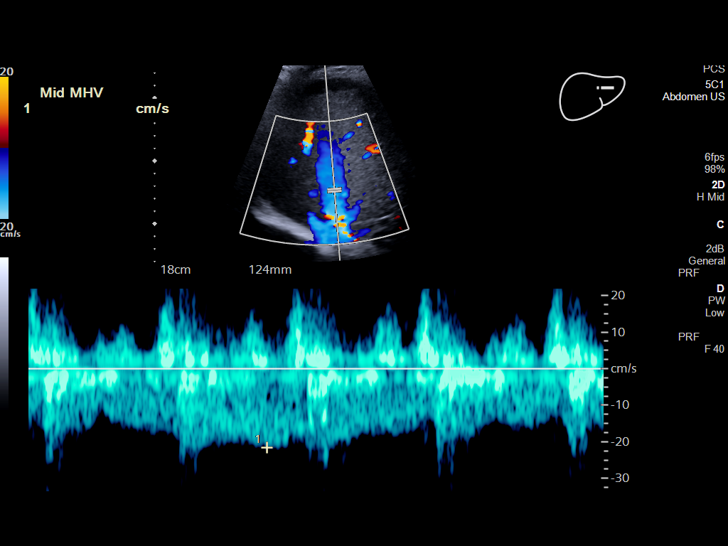
[im 44/75]
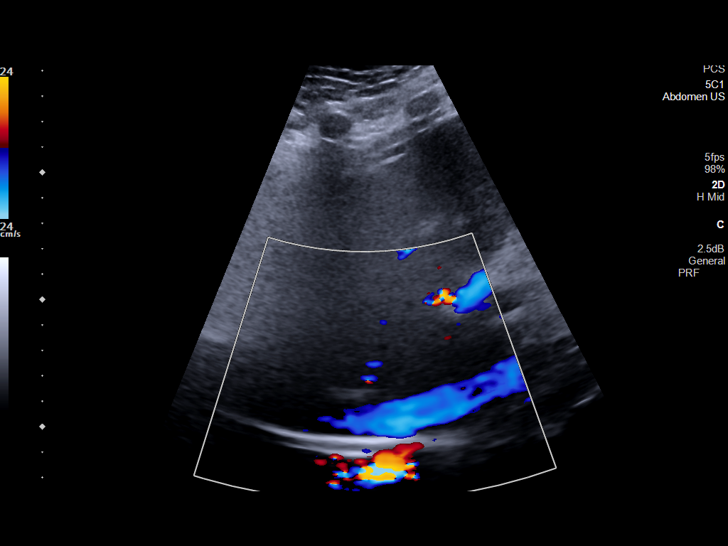
[im 50/75]
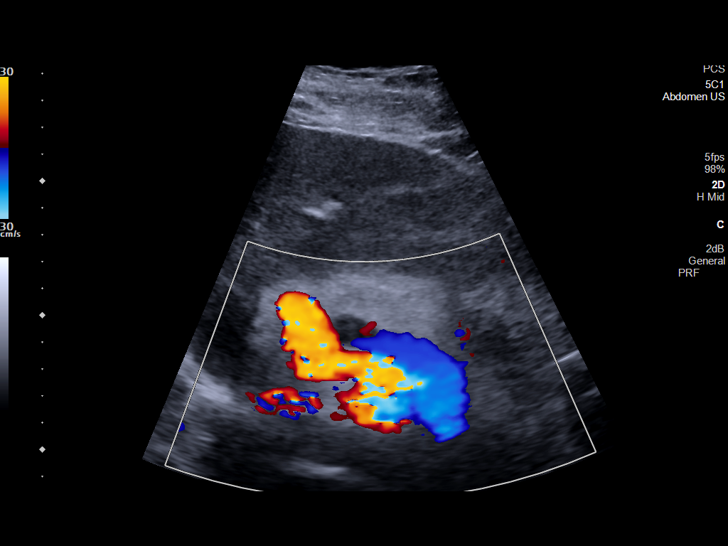
[im 56/75]
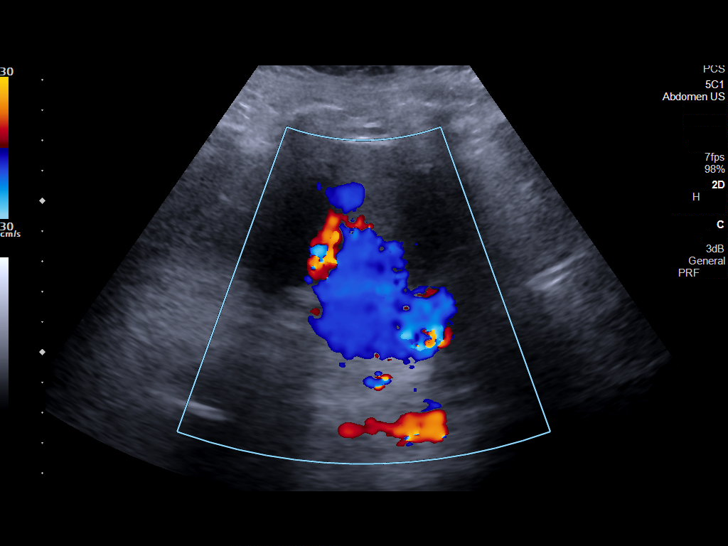
[im 62/75]
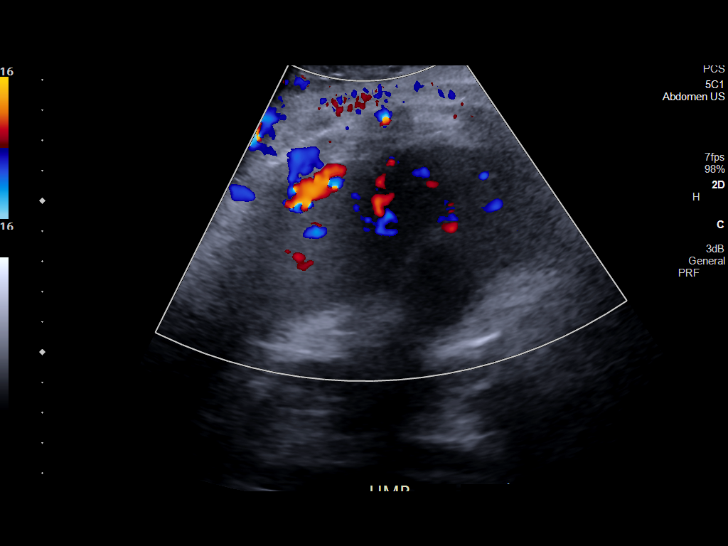
[im 68/75]
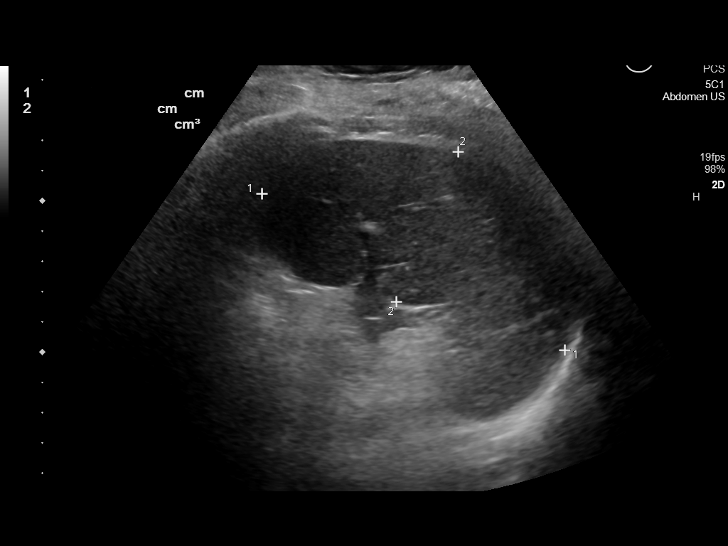
[im 75/75]
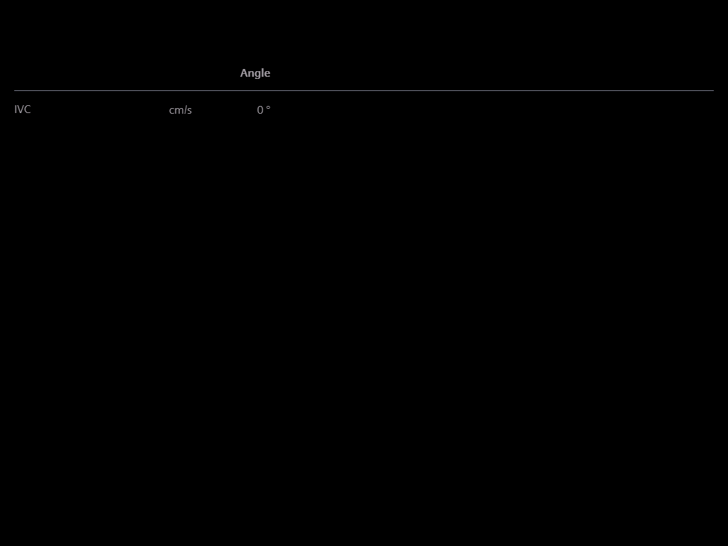

[13 of 25 positions shown; findings below may reference images not displayed]

FINDINGS: Liver: Unchanged coarsened, heterogeneously increased parenchymal
echogenicity. Unchanged nodular contour.

No focal lesion, mass or intrahepatic biliary ductal dilatation.

Main Portal Vein size: 1.1 cm

Portal Vein Velocities

Main Prox:  37.2 cm/sec

Main Mid: 35.6 cm/sec

Main Dist:  25.0 cm/sec
Right: Not visualized.
Left: 13.4 cm/sec

Portal venous flow is hepatofugal.

Hepatic Vein Velocities

Right:  19.9 cm/sec

Middle:  21.6 cm/sec

Left: 16.3 cm/sec

IVC: Present and patent with normal respiratory phasicity.

Hepatic Artery Velocity:  177 cm/sec

Splenic Vein Velocity:  18.6 cm/sec.  Bidirectional flow noted.

Spleen: 10.0 cm x 11.3 cm x 5.4 cm with a total volume of 317 cm^3
(411 cm^3 is upper limit normal)

Portal Vein Occlusion/Thrombus: No

Splenic Vein Occlusion/Thrombus: No

Ascites: Small perihepatic ascites.

Varices: Suspected recanalized umbilical vein.
IMPRESSION: 1. Patent portal venous system with new hepatofugal flow in the main
portal vein and bidirectional flow in the splenic vein, consistent
with portal venous hypertension.
2. Unchanged cirrhosis and small perihepatic ascites.

## 2023-03-04 IMAGING — US US EXTREM  UP VENOUS*L*
1 series · 13 of 24 positions shown · non-contrast
Comparison: Left upper extremity venous Doppler
ultrasound-08/27/2021

CLINICAL DATA: History of left upper extremity DVT. Evaluate for
acute or chronic DVT.



[Series 1: us venous img upper uni left (dvt) · portal-venous · 13 of 34 slices shown]
[im 1/34]
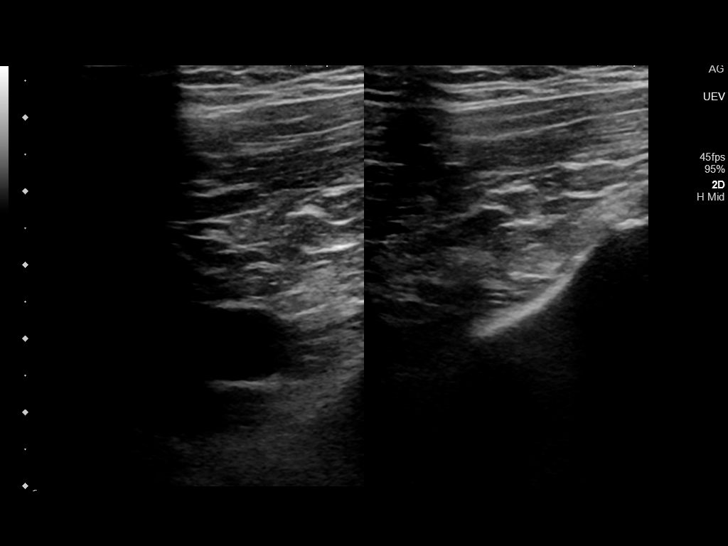
[im 3/34]
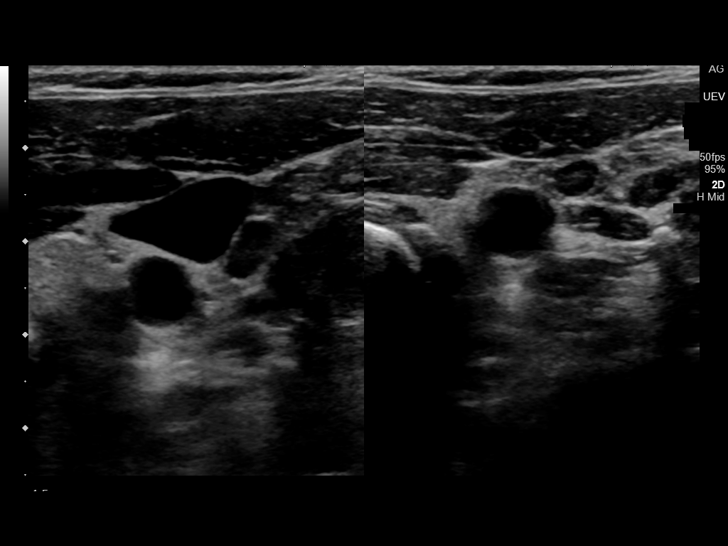
[im 6/34]
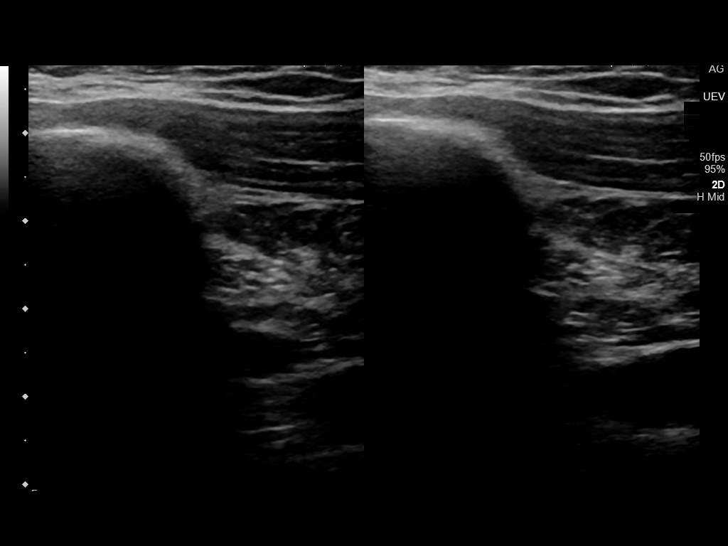
[im 9/34]
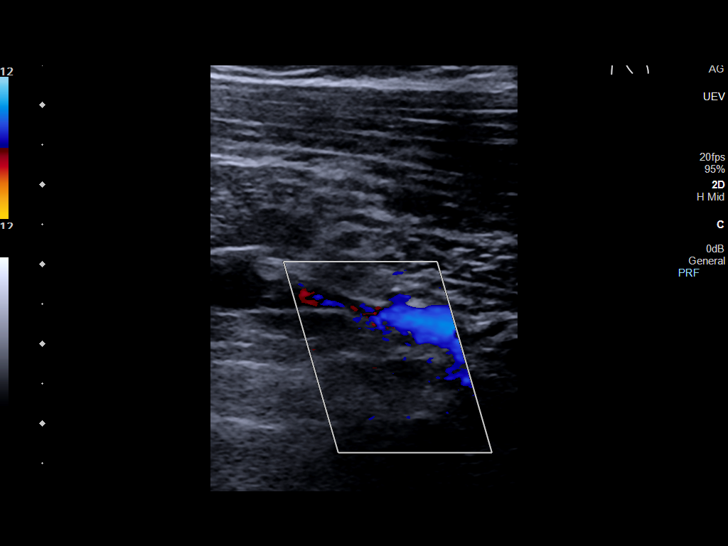
[im 12/34]
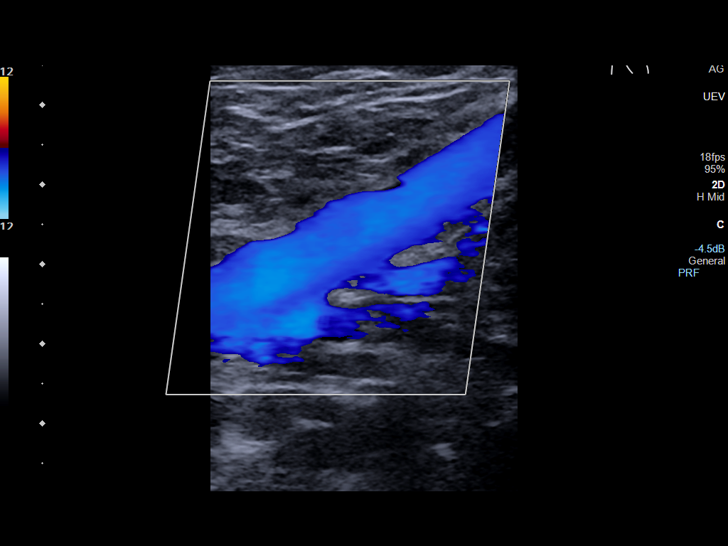
[im 15/34]
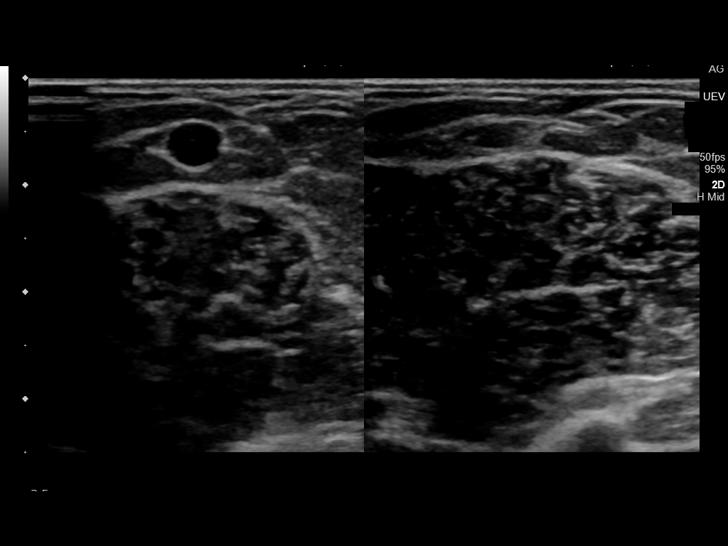
[im 18/34]
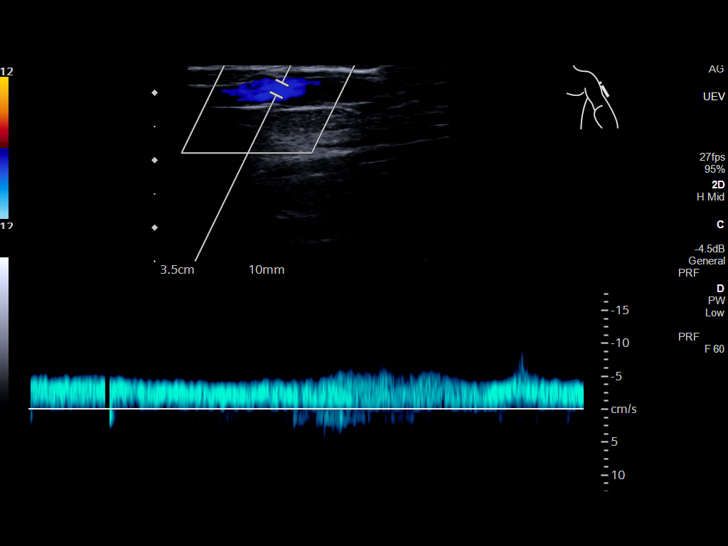
[im 19/34]
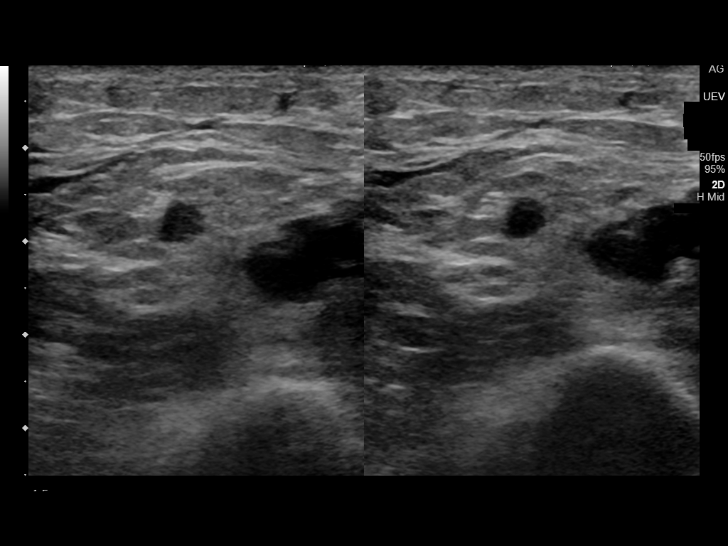
[im 22/34]
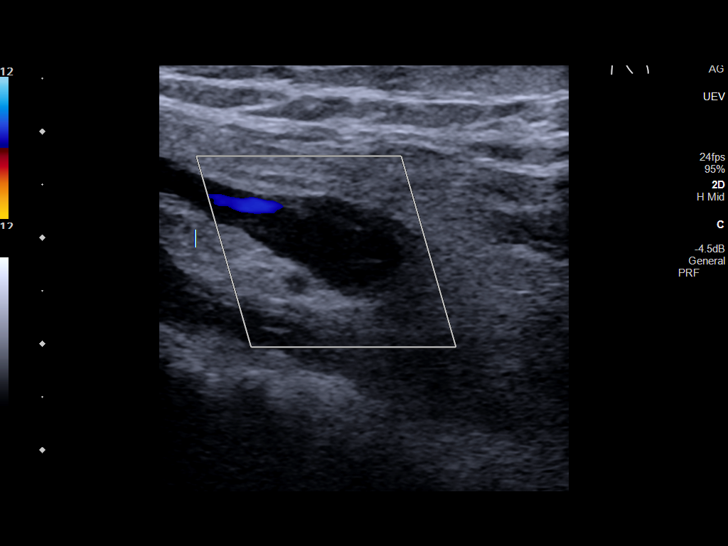
[im 25/34]
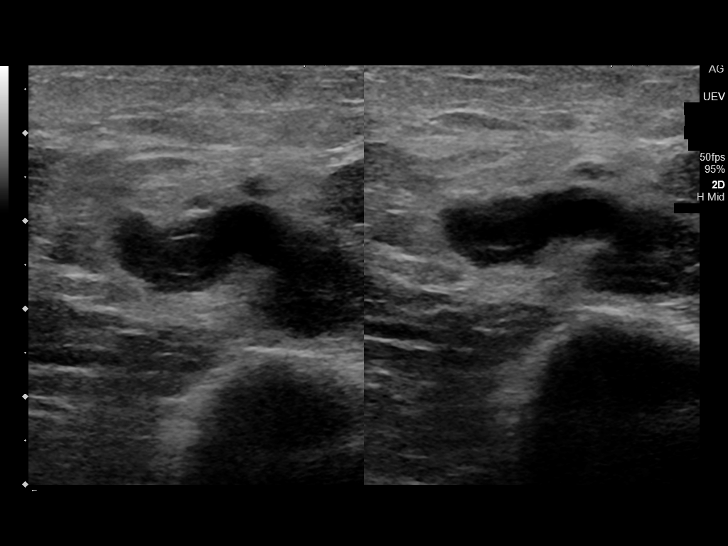
[im 28/34]
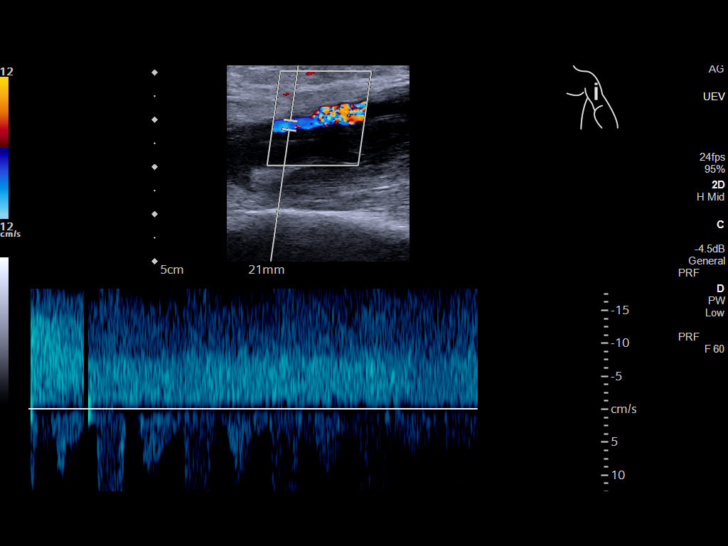
[im 31/34]
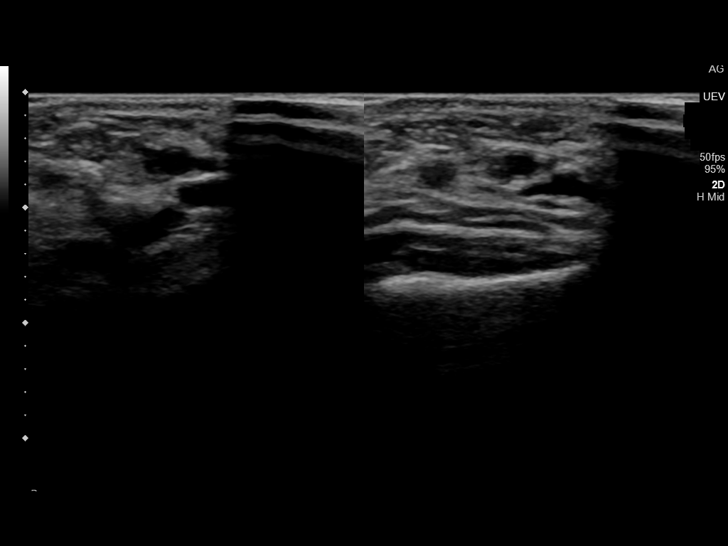
[im 34/34]
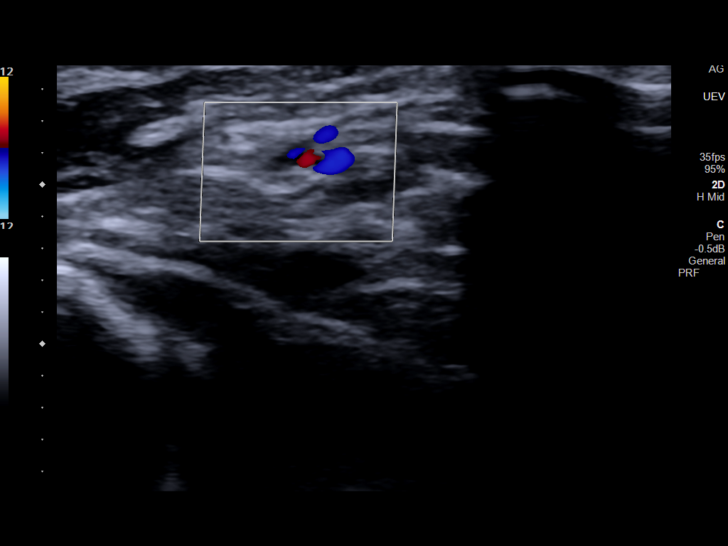

[13 of 24 positions shown; findings below may reference images not displayed]

FINDINGS: Contralateral Subclavian Vein: Respiratory phasicity is normal and
symmetric with the symptomatic side. No evidence of thrombus. Normal
compressibility.

Internal Jugular Vein: No evidence of thrombus. Normal
compressibility, respiratory phasicity and response to augmentation.

Subclavian Vein: No evidence of thrombus. Normal compressibility,
respiratory phasicity and response to augmentation.

Axillary Vein: No evidence of thrombus. Normal compressibility,
respiratory phasicity and response to augmentation.

Cephalic Vein: No evidence of thrombus. Normal compressibility,
respiratory phasicity and response to augmentation.

Basilic Vein: There is hypoechoic occlusive thrombus involving the
left basilic vein extending from the proximal to the distal humerus
(images 19 - 21), unchanged.

Brachial Veins: There is hypoechoic occlusive thrombus involving
both paired segments of the left brachial vein extending from the
proximal to the distal humerus (images 19 through 30), unchanged.

Radial Veins: No evidence of thrombus. Normal compressibility,
respiratory phasicity and response to augmentation.

Ulnar Veins: No evidence of thrombus. Normal compressibility,
respiratory phasicity and response to augmentation.

Other Findings:  None visualized.
IMPRESSION: 1. Redemonstrated occlusive DVT involving both paired segments of
the brachial vein, unchanged compared to the [DATE] examination.
2. Redemonstrated occlusive SVT involving the left basilic vein,
unchanged compared to the [DATE] examination.

## 2023-12-01 ENCOUNTER — Emergency Department
Admission: EM | Admit: 2023-12-01 | Discharge: 2023-12-02 | Disposition: A | Discharge status: Home / Self Care | Payer: BC Managed Care – PPO | Attending: Emergency Medicine | Admitting: Emergency Medicine

## 2023-12-01 ENCOUNTER — Emergency Department: Payer: BC Managed Care – PPO

## 2023-12-01 DIAGNOSIS — R55 Syncope and collapse: Secondary | ICD-10-CM | POA: Diagnosis present

## 2023-12-01 DIAGNOSIS — R4689 Other symptoms and signs involving appearance and behavior: Secondary | ICD-10-CM | POA: Insufficient documentation

## 2023-12-01 DIAGNOSIS — F10920 Alcohol use, unspecified with intoxication, uncomplicated: Secondary | ICD-10-CM

## 2023-12-01 LAB — COMPREHENSIVE METABOLIC PANEL
ALT: 52 U/L — ABNORMAL HIGH (ref 0–44)
AST: 104 U/L — ABNORMAL HIGH (ref 15–41)
Albumin: 3.1 g/dL — ABNORMAL LOW (ref 3.5–5.0)
Alkaline Phosphatase: 120 U/L (ref 38–126)
Anion gap: 12 (ref 5–15)
BUN: 9 mg/dL (ref 6–20)
CO2: 20 mmol/L — ABNORMAL LOW (ref 22–32)
Calcium: 8.5 mg/dL — ABNORMAL LOW (ref 8.9–10.3)
Chloride: 102 mmol/L (ref 98–111)
Creatinine, Ser: 0.96 mg/dL (ref 0.61–1.24)
GFR, Estimated: >60 mL/min (ref >60)
Glucose, Bld: 156 mg/dL — ABNORMAL HIGH (ref 70–99)
Potassium: 4.2 mmol/L (ref 3.5–5.1)
Sodium: 134 mmol/L — ABNORMAL LOW (ref 135–145)
Total Bilirubin: 1.5 mg/dL — ABNORMAL HIGH (ref 0.0–1.2)
Total Protein: 8.2 g/dL — ABNORMAL HIGH (ref 6.5–8.1)

## 2023-12-01 LAB — CBC WITH DIFFERENTIAL/PLATELET
Abs Immature Granulocytes: 0.01 10*3/uL (ref 0.00–0.07)
Basophils Absolute: 0 10*3/uL (ref 0.0–0.1)
Basophils Relative: 1 %
Eosinophils Absolute: 0.2 10*3/uL (ref 0.0–0.5)
Eosinophils Relative: 4 %
HCT: 41.2 % (ref 39.0–52.0)
Hemoglobin: 13.8 g/dL (ref 13.0–17.0)
Immature Granulocytes: 0 %
Lymphocytes Relative: 50 %
Lymphs Abs: 2.5 10*3/uL (ref 0.7–4.0)
MCH: 29.4 pg (ref 26.0–34.0)
MCHC: 33.5 g/dL (ref 30.0–36.0)
MCV: 87.7 fL (ref 80.0–100.0)
Monocytes Absolute: 0.6 10*3/uL (ref 0.1–1.0)
Monocytes Relative: 12 %
Neutro Abs: 1.7 10*3/uL (ref 1.7–7.7)
Neutrophils Relative %: 33 %
Platelets: 81 10*3/uL — ABNORMAL LOW (ref 150–400)
RBC: 4.7 MIL/uL (ref 4.22–5.81)
RDW: 16.5 % — ABNORMAL HIGH (ref 11.5–15.5)
WBC: 5 10*3/uL (ref 4.0–10.5)
nRBC: 0 % (ref 0.0–0.2)

## 2023-12-01 LAB — URINALYSIS, ROUTINE W REFLEX MICROSCOPIC
Bilirubin Urine: NEGATIVE
Glucose, UA: NEGATIVE mg/dL
Hgb urine dipstick: NEGATIVE
Ketones, ur: NEGATIVE mg/dL
Leukocytes,Ua: NEGATIVE
Nitrite: NEGATIVE
Protein, ur: NEGATIVE mg/dL
Specific Gravity, Urine: 1.005 (ref 1.005–1.030)
pH: 6 (ref 5.0–8.0)

## 2023-12-01 LAB — TROPONIN I (HIGH SENSITIVITY): Troponin I (High Sensitivity): 11 ng/L (ref <18)

## 2023-12-01 LAB — CBG MONITORING, ED: Glucose-Capillary: 136 mg/dL — ABNORMAL HIGH (ref 70–99)

## 2023-12-01 NOTE — ED Provider Notes (Signed)
 11:45 PM  Assumed care at shift change.  Patient here with altered mental status.  Likely intoxicated.  Ethanol, ammonia level, drug screen pending.  CT head unremarkable.  12:45 AM  Pt repeatedly asking to leave.  Family at bedside.  Alcohol level has come back at 371.  Ammonia level minimally elevated at 65 but he does not appear encephalopathic currently.  Drug screen negative.  Suspect symptoms today secondary to alcohol intoxication.  Patient has been able to ambulate, tolerate p.o. here.  Family is comfortable taking him home.   At this time, I do not feel there is any life-threatening condition present. I reviewed all nursing notes, vitals, pertinent previous records.  All lab and urine results, EKGs, imaging ordered have been independently reviewed and interpreted by myself.  I reviewed all available radiology reports from any imaging ordered this visit.  Based on my assessment, I feel the patient is safe to be discharged home without further emergent workup and can continue workup as an outpatient as needed. Discussed all findings, treatment plan as well as usual and customary return precautions.  They verbalize understanding and are comfortable with this plan.  Outpatient follow-up has been provided as needed.  All questions have been answered.    Gerrianne Aydelott, Josette SAILOR, DO 12/02/23 [phone]

## 2023-12-01 NOTE — ED Triage Notes (Signed)
 Patient brought in by medic. Friend called due to patient passing out. Medic states the patient had ETOH on board . He had an unresponsive episode and was disoriented to time, situation, location and became combative. He was complaining of headache when medic arrived to the home, subsided by the time patient arrived to the hospital. History of liver disease, etoh abuse. The patient is disoriented upon arrival. Denies pain, dizziness.

## 2023-12-01 NOTE — ED Provider Notes (Signed)
 [facility] Provider Note    Event Date/Time   First MD Initiated Contact with Patient 12/01/23 2230     (approximate)   History   Altered Mental Status   HPI  Terrence Martin is a 58 y.o. male who presents to the emergency department via EMS after episode of unresponsiveness and abnormal behavior.  Per report patient does have alcohol on board.  Apparently bystanders saw patient passed out.  When he then woke up he could not remember who his friends were.  EMS witnessed 2 other episodes where he would could become briefly unresponsive and then not remember anything.  Patient himself says he does not remember what happened.  He remembers preparing hamburgers.  He denies any pain or headache.     Physical Exam   Triage Vital Signs: ED Triage Vitals  Encounter Vitals Group     BP --      Systolic BP Percentile --      Diastolic BP Percentile --      Pulse --      Resp --      Temp 12/01/23 2241 98 F (36.7 C)     Temp Source 12/01/23 2241 Oral     SpO2 --      Weight --      Height --      Head Circumference --      Peak Flow --      Pain Score 12/01/23 2242 0     Pain Loc --      Pain Education --      Exclude from Growth Chart --     Most recent vital signs: Vitals:   12/01/23 2241  Temp: 98 F (36.7 C)   General: Awake, alert, not completely oriented. CV:  Good peripheral perfusion. Regular rate and rhythm. Resp:  Normal effort. Lungs clear. Abd:  No distention.  Other:  PERRL. EOMI. Face symmetric. Moving all extremities, does have some difficulty following commands. Sensation grossly intact.    ED Results / Procedures / Treatments   Labs (all labs ordered are listed, but only abnormal results are displayed) Labs Reviewed  CBC WITH DIFFERENTIAL/PLATELET - Abnormal; Notable for the following components:      Result Value   RDW 16.5 (*)    Platelets 81 (*)    All other components within normal limits  COMPREHENSIVE METABOLIC  PANEL - Abnormal; Notable for the following components:   Sodium 134 (*)    CO2 20 (*)    Glucose, Bld 156 (*)    Calcium 8.5 (*)    Total Protein 8.2 (*)    Albumin  3.1 (*)    AST 104 (*)    ALT 52 (*)    Total Bilirubin 1.5 (*)    All other components within normal limits  URINALYSIS, ROUTINE W REFLEX MICROSCOPIC - Abnormal; Notable for the following components:   Color, Urine STRAW (*)    APPearance CLEAR (*)    All other components within normal limits  CBG MONITORING, ED - Abnormal; Notable for the following components:   Glucose-Capillary 136 (*)    All other components within normal limits  ETHANOL  AMMONIA  TROPONIN I (HIGH SENSITIVITY)     RADIOLOGY I independently interpreted and visualized the CT head. My interpretation: No bleed Radiology interpretation:  IMPRESSION:  No acute intracranial abnormality noted.     PROCEDURES:  Critical Care performed: No   MEDICATIONS ORDERED IN ED: Medications - No data to  display   IMPRESSION / MDM / ASSESSMENT AND PLAN / ED COURSE  I reviewed the triage vital signs and the nursing notes.                              Differential diagnosis includes, but is not limited to, alcohol intoxication, ICH, electrolyte abnormality  Patient's presentation is most consistent with acute presentation with potential threat to life or bodily function.   The patient is on the cardiac monitor to evaluate for evidence of arrhythmia and/or significant heart rate changes.  Patient presents to the emergency department today after episode of unresponsiveness and memory lapse.  On my exam patient is awake alert although not completely oriented to the events.  He does have some difficulty following commands.  Does however appear that his strength is symmetric as is his sensation.  Head CT did not show any acute bleed.  Blood work without concerning leukocytosis or electrolyte abnormality.  Awaiting ethanol level at time of signout.   Additionally patient has history of cirrhosis so will get ammonia level.     FINAL CLINICAL IMPRESSION(S) / ED DIAGNOSES   Final diagnoses:  Syncope, unspecified syncope type    Note:  This document was prepared using Dragon voice recognition software and may include unintentional dictation errors.    Floy Roberts, MD 12/01/23 2329

## 2023-12-02 ENCOUNTER — Other Ambulatory Visit: Payer: Self-pay

## 2023-12-02 LAB — URINE DRUG SCREEN, QUALITATIVE (ARMC ONLY)
Amphetamines, Ur Screen: NOT DETECTED
Barbiturates, Ur Screen: NOT DETECTED
Benzodiazepine, Ur Scrn: NOT DETECTED
Cannabinoid 50 Ng, Ur ~~LOC~~: NOT DETECTED
Cocaine Metabolite,Ur ~~LOC~~: NOT DETECTED
MDMA (Ecstasy)Ur Screen: NOT DETECTED
Methadone Scn, Ur: NOT DETECTED
Opiate, Ur Screen: NOT DETECTED
Phencyclidine (PCP) Ur S: NOT DETECTED
Tricyclic, Ur Screen: NOT DETECTED

## 2023-12-02 LAB — AMMONIA: Ammonia: 65 umol/L — ABNORMAL HIGH (ref 9–35)

## 2023-12-02 LAB — ETHANOL: Alcohol, Ethyl (B): 371 mg/dL (ref <10)

## 2023-12-02 NOTE — ED Notes (Signed)
 Patient tolerated po well. Ambulated will minimal assistance.

## 2023-12-02 NOTE — ED Notes (Signed)
 Critical ethanol 371 - provider notified.
